# Patient Record
Sex: Male | Born: 1965
Health system: Southern US, Community
[De-identification: ages and names within clinical notes are randomized; demographics above are authoritative.]

## PROBLEM LIST (undated history)

## (undated) DIAGNOSIS — M67919 Unspecified disorder of synovium and tendon, unspecified shoulder: Secondary | ICD-10-CM

## (undated) DIAGNOSIS — I10 Essential (primary) hypertension: Secondary | ICD-10-CM

## (undated) DIAGNOSIS — N200 Calculus of kidney: Secondary | ICD-10-CM

## (undated) HISTORY — DX: Essential (primary) hypertension: I10

---

## 2003-02-08 HISTORY — PX: BACK SURGERY: SHX140

## 2003-08-20 ENCOUNTER — Observation Stay (HOSPITAL_COMMUNITY): Admission: RE | Admit: 2003-08-20 | Discharge: 2003-08-21 | Payer: Self-pay | Admitting: Specialist

## 2004-08-02 ENCOUNTER — Emergency Department (HOSPITAL_COMMUNITY): Admission: EM | Admit: 2004-08-02 | Discharge: 2004-08-03 | Payer: Self-pay | Admitting: Emergency Medicine

## 2004-11-19 ENCOUNTER — Emergency Department (HOSPITAL_COMMUNITY): Admission: EM | Admit: 2004-11-19 | Discharge: 2004-11-19 | Payer: Self-pay | Admitting: Emergency Medicine

## 2005-02-07 HISTORY — PX: SPINE SURGERY: SHX786

## 2006-04-03 ENCOUNTER — Emergency Department (HOSPITAL_COMMUNITY): Admission: EM | Admit: 2006-04-03 | Discharge: 2006-04-03 | Payer: Self-pay | Admitting: Emergency Medicine

## 2010-10-24 ENCOUNTER — Emergency Department (HOSPITAL_COMMUNITY)
Admission: EM | Admit: 2010-10-24 | Discharge: 2010-10-25 | Disposition: A | Discharge status: Home / Self Care | Payer: BC Managed Care – PPO | Attending: Emergency Medicine | Admitting: Emergency Medicine

## 2010-10-24 DIAGNOSIS — X58XXXA Exposure to other specified factors, initial encounter: Secondary | ICD-10-CM | POA: Insufficient documentation

## 2010-10-24 DIAGNOSIS — I10 Essential (primary) hypertension: Secondary | ICD-10-CM | POA: Insufficient documentation

## 2010-10-24 DIAGNOSIS — M549 Dorsalgia, unspecified: Secondary | ICD-10-CM | POA: Insufficient documentation

## 2010-10-24 DIAGNOSIS — T148XXA Other injury of unspecified body region, initial encounter: Secondary | ICD-10-CM | POA: Insufficient documentation

## 2010-10-25 LAB — URINALYSIS, ROUTINE W REFLEX MICROSCOPIC
Bilirubin Urine: NEGATIVE
Ketones, ur: NEGATIVE mg/dL
Leukocytes, UA: NEGATIVE
Nitrite: NEGATIVE
Urobilinogen, UA: 0.2 mg/dL (ref 0.0–1.0)

## 2010-10-25 LAB — POCT I-STAT, CHEM 8
BUN: 18 mg/dL (ref 6–23)
Chloride: 105 mEq/L (ref 96–112)
Creatinine, Ser: 1.2 mg/dL (ref 0.50–1.35)
Glucose, Bld: 93 mg/dL (ref 70–99)
Sodium: 141 mEq/L (ref 135–145)
TCO2: 25 mmol/L (ref 0–100)

## 2012-02-08 HISTORY — PX: SHOULDER SURGERY: SHX246

## 2012-08-20 ENCOUNTER — Ambulatory Visit (HOSPITAL_COMMUNITY)
Admission: RE | Admit: 2012-08-20 | Discharge: 2012-08-20 | Disposition: A | Discharge status: Home / Self Care | Payer: 59 | Source: Ambulatory Visit | Attending: Family Medicine | Admitting: Family Medicine

## 2012-08-20 ENCOUNTER — Ambulatory Visit (INDEPENDENT_AMBULATORY_CARE_PROVIDER_SITE_OTHER): Payer: 59 | Admitting: Family Medicine

## 2012-08-20 VITALS — BP 134/90 | HR 70 | Temp 98.1°F | Resp 16 | Ht 75.0 in | Wt 271.0 lb

## 2012-08-20 DIAGNOSIS — M51379 Other intervertebral disc degeneration, lumbosacral region without mention of lumbar back pain or lower extremity pain: Secondary | ICD-10-CM | POA: Insufficient documentation

## 2012-08-20 DIAGNOSIS — I708 Atherosclerosis of other arteries: Secondary | ICD-10-CM | POA: Insufficient documentation

## 2012-08-20 DIAGNOSIS — N289 Disorder of kidney and ureter, unspecified: Secondary | ICD-10-CM | POA: Insufficient documentation

## 2012-08-20 DIAGNOSIS — I700 Atherosclerosis of aorta: Secondary | ICD-10-CM | POA: Insufficient documentation

## 2012-08-20 DIAGNOSIS — M5137 Other intervertebral disc degeneration, lumbosacral region: Secondary | ICD-10-CM | POA: Insufficient documentation

## 2012-08-20 DIAGNOSIS — N200 Calculus of kidney: Secondary | ICD-10-CM | POA: Insufficient documentation

## 2012-08-20 DIAGNOSIS — M549 Dorsalgia, unspecified: Secondary | ICD-10-CM

## 2012-08-20 DIAGNOSIS — R3129 Other microscopic hematuria: Secondary | ICD-10-CM

## 2012-08-20 DIAGNOSIS — R911 Solitary pulmonary nodule: Secondary | ICD-10-CM | POA: Insufficient documentation

## 2012-08-20 LAB — POCT UA - MICROSCOPIC ONLY
Bacteria, U Microscopic: NEGATIVE
Casts, Ur, LPF, POC: NEGATIVE
Crystals, Ur, HPF, POC: NEGATIVE
Mucus, UA: NEGATIVE

## 2012-08-20 LAB — POCT CBC
HCT, POC: 45 % (ref 43.5–53.7)
Lymph, poc: 2 (ref 0.6–3.4)
MCHC: 33.1 g/dL (ref 31.8–35.4)
MPV: 8.7 fL (ref 0–99.8)
POC LYMPH PERCENT: 18.8 %L (ref 10–50)
POC MID %: 7.8 %M (ref 0–12)
Platelet Count, POC: 343 10*3/uL (ref 142–424)
RBC: 4.97 M/uL (ref 4.69–6.13)
WBC: 10.9 10*3/uL — AB (ref 4.6–10.2)

## 2012-08-20 LAB — POCT URINALYSIS DIPSTICK
Bilirubin, UA: NEGATIVE
Ketones, UA: NEGATIVE
Leukocytes, UA: NEGATIVE
Spec Grav, UA: >=1.03
Urobilinogen, UA: 0.2

## 2012-08-20 MED ORDER — HYDROCODONE-ACETAMINOPHEN 10-325 MG PO TABS: 1.0000 | ORAL_TABLET | Freq: Three times a day (TID) | ORAL | Status: DC | PRN

## 2012-08-20 MED ORDER — TAMSULOSIN HCL 0.4 MG PO CAPS: 0.4000 mg | ORAL_CAPSULE | Freq: Every day | ORAL | Status: DC

## 2012-08-20 NOTE — Progress Notes (Signed)
[facility] [address], [place] [address] [phone]  Date:  08/20/2012   Name:  Parker Saunders   DOB:  1965/07/28   MRN:  [id number]  PCP:  No primary provider on file.    Chief Complaint: Back Pain   History of Present Illness:  Parker Saunders is a 47 y.o. very pleasant male patient who presents with the following:  He has noted a pain in his right lower back for about 2 weeks.  He has had a disectomy in 2005 at L3/4.  This seems different than the pain he had then.    No known injury, no unusual activities.   Insidious onset.  The pain will come and go.   No hematuria. No dysuria.   No radiation to his legs.  No difficulty with bowel or bladder control, no weakness or numbness of his legs.    He has tried some ibuprofen so far, but it did not help.    He is generally healthy.    He had a possible kidney stone (never had a CT) several years ago. Never had any procedure to treat a stone  He has shoulder surgery scheduled for 08/31/12 for a WC injury  He is otherwise generally healthy    There are no active problems to display for this patient.   History reviewed. No pertinent past medical history.  Past Surgical History  Procedure Laterality Date  . Spine surgery      History  Substance Use Topics  . Smoking status: Never Smoker   . Smokeless tobacco: Not on file  . Alcohol Use: No    History reviewed. No pertinent family history.  No Known Allergies  Medication list has been reviewed and updated.  No current outpatient prescriptions on file prior to visit.   No current facility-administered medications on file prior to visit.    Review of Systems:  As per HPI- otherwise negative.   Physical Examination: Filed Vitals:   08/20/12 1453  BP: 134/90  Pulse: 100  Temp: 98.1 F (36.7 C)  Resp: 16   Filed Vitals:   08/20/12 1453  Height: 6\' 3"  (1.905 m)  Weight: 271 lb (122.925 kg)   Body mass index is 33.87  kg/(m^2). Ideal Body Weight: Weight in (lb) to have BMI = 25: 199.6  GEN: WDWN, NAD, Non-toxic, A & O x 3, health appearing  HEENT: Atraumatic, Normocephalic. Neck supple. No masses, No LAD. Ears and Nose: No external deformity. CV: RRR, No M/G/R. No JVD. No thrill. No extra heart sounds. PULM: CTA B, no wheezes, crackles, rhonchi. No retractions. No resp. distress. No accessory muscle use. ABD: S, NT, ND. No rebound. No HSM. EXTR: No c/c/e NEURO Normal gait.  PSYCH: Normally interactive. Conversant. Not depressed or anxious appearing.  Calm demeanor.  Back: he has mild tenderness and tightness of the muscles in the right lumbar area.  Normal flexion, extension and rotation of the spine Negative SLR bilateally, normal sensation and DTR bilateral legs, normal strength bilateral legs   Results for orders placed in visit on 08/20/12  POCT UA - MICROSCOPIC ONLY      Result Value Range   WBC, Ur, HPF, POC 0-2     RBC, urine, microscopic 6-15     Bacteria, U Microscopic neg     Mucus, UA neg     Epithelial cells, urine per micros 0-3     Crystals, Ur, HPF, POC neg  Casts, Ur, LPF, POC neg     Yeast, UA neg    POCT URINALYSIS DIPSTICK      Result Value Range   Color, UA yellow     Clarity, UA cloudy     Glucose, UA neg     Bilirubin, UA neg     Ketones, UA neg     Spec Grav, UA >=1.030     Blood, UA mod     pH, UA 6.0     Protein, UA 30     Urobilinogen, UA 0.2     Nitrite, UA neg     Leukocytes, UA Negative    POCT CBC      Result Value Range   WBC 10.9 (*) 4.6 - 10.2 K/uL   Lymph, poc 2.0  0.6 - 3.4   POC LYMPH PERCENT 18.8  10 - 50 %L   MID (cbc) 0.9  0 - 0.9   POC MID % 7.8  0 - 12 %M   POC Granulocyte 8.0 (*) 2 - 6.9   Granulocyte percent 73.4  37 - 80 %G   RBC 4.97  4.69 - 6.13 M/uL   Hemoglobin 14.9  14.1 - 18.1 g/dL   HCT, POC 16.1  09.6 - 53.7 %   MCV 90.5  80 - 97 fL   MCH, POC 30.0  27 - 31.2 pg   MCHC 33.1  31.8 - 35.4 g/dL   RDW, POC 04.5     Platelet  Count, POC 343  142 - 424 K/uL   MPV 8.7  0 - 99.8 fL     Assessment and Plan: Back pain - Plan: POCT UA - Microscopic Only, POCT urinalysis dipstick, CT Abdomen Pelvis Wo Contrast, HYDROcodone-acetaminophen (NORCO) 10-325 MG per tablet  Microhematuria - Plan: POCT CBC, Comprehensive metabolic panel, Urine culture, tamsulosin (FLOMAX) 0.4 MG CAPS  Probably nephrolithiasis.  Would like to confirm dx as he has never been proven to have stones in the past. Pt would like to proceed with CT scan. Sent for non- contrasted CT now.   Signed Abbe Amsterdam, MD  Received CT results-  CT ABDOMEN AND PELVIS WITHOUT CONTRAST  Technique: Multidetector CT imaging of the abdomen and pelvis was performed following the standard protocol without intravenous contrast.  Comparison: 04/03/2006.  Findings:  BODY WALL: Unremarkable.  LOWER CHEST:  Mediastinum: Unremarkable.  Lungs/pleura: 4 mm right lower lobe pulmonary nodule, contacting the dome of the diaphragm. Minimal dependent atelectasis right lower lobe. This area not imaged before.  ABDOMEN/PELVIS:  Liver: No focal abnormality.  Biliary: No evidence of biliary obstruction or stone.  Pancreas: Unremarkable.  Spleen: Unremarkable.  Adrenals: Unremarkable.  Kidneys and ureters: 10 mm stone in the right renal pelvis, new from 2008. No overt hydronephrosis. There is urothelial thickening around the stone. 3 cm water density exophytic mass from the interpolar right kidney is new or larger from prior, strictly indeterminate without contrast, but likely a cyst.  Bladder: Unremarkable.  Bowel: No obstruction. Normal appendix.  Retroperitoneum: No mass or adenopathy.  Peritoneum: No free fluid or gas.  Reproductive: Unremarkable.  Vascular: Mild aortic and iliac atherosclerosis.  OSSEOUS: No acute abnormalities. Age advanced degenerative disc and facet disease with foraminal narrowing most notable at  L4-5.  IMPRESSION:  1. Ten mm stone in the right renal pelvis, new from 2008. No hydronephrosis currently, although the study may be intermittently obstructive. Urothelial thickening surrounds the stone.  2. Four mm right lower lobe pulmonary nodule. If the  patient is at high risk for bronchogenic carcinoma, follow-up chest CT at 1 year is recommended. If the patient is at low risk, no follow-up is needed. This recommendation follows the consensus statement: Guidelines for Management of Small Pulmonary Nodules Detected on CT Scans: A Statement from the Fleischner Society as published in Radiology 2005; 237:395-400.  Called and discussed with his wife.  He does have a right renal stone which probably will not be able to pass on its own.  He will use flomax and pain medication, and will refer to urology asap.  If any severe pain tonight please go to ED.  Otherwise I will work on urology referral in the AM.   Checked on his at 10pm- he is doing ok, pain medication has helped some.  Let him know that he can also try a 1/2 pain tablet as 5mg  of hydrocodone may be adequate.  However, he weighs 271lbs so 10mg  may be necessary.

## 2012-08-20 NOTE — Patient Instructions (Addendum)
Your [facility] insurance has authorized the CT Scan to be done today . It will be at [facility]. You can go now.   I will call you once the scan is complete. Assuming that you have a stone we will have you use the flomax to help pass the stone, and the pain medication as needed.  If you do not have a stone you can just use the pain medication, and we can use a muscle relaxer if needed   Driving directions to [facility] [id number]  [phone]  - more info    [address]  [place], [place] [address]     1. Head north on [address] toward [address]      344 [address]    2. Turn right onto [address]      0.3 mi    3. Slight left to stay on [address]      1.7 mi    4. Turn left onto [address]  Destination will be on the right     0.6 mi     [facility]  [address] [place]

## 2012-08-21 ENCOUNTER — Encounter (HOSPITAL_COMMUNITY): Payer: Self-pay | Admitting: Pharmacy Technician

## 2012-08-21 ENCOUNTER — Telehealth: Payer: Self-pay

## 2012-08-21 ENCOUNTER — Other Ambulatory Visit: Payer: Self-pay | Admitting: Urology

## 2012-08-21 LAB — COMPREHENSIVE METABOLIC PANEL
ALT: 25 U/L (ref 0–53)
Albumin: 4.5 g/dL (ref 3.5–5.2)
BUN: 16 mg/dL (ref 6–23)
CO2: 25 mEq/L (ref 19–32)
Calcium: 9.3 mg/dL (ref 8.4–10.5)
Glucose, Bld: 84 mg/dL (ref 70–99)
Total Bilirubin: 0.4 mg/dL (ref 0.3–1.2)
Total Protein: 7.2 g/dL (ref 6.0–8.3)

## 2012-08-21 NOTE — Telephone Encounter (Signed)
Dr.Copland is calling to request that pts OV and CT scan from yesterday be faxed to [facility].

## 2012-08-22 ENCOUNTER — Telehealth: Payer: Self-pay | Admitting: Family Medicine

## 2012-08-22 ENCOUNTER — Encounter: Payer: Self-pay | Admitting: Family Medicine

## 2012-08-22 DIAGNOSIS — R911 Solitary pulmonary nodule: Secondary | ICD-10-CM

## 2012-08-22 LAB — URINE CULTURE
Colony Count: NO GROWTH
Organism ID, Bacteria: NO GROWTH

## 2012-08-22 NOTE — Telephone Encounter (Signed)
Called to check on him.  He is having some sort of surgery (? Lithotripsy) on Monday per urology to take care of his stone.  Discussed lung nodule noted on CT scan.  He has never been a smoker, but has worked around a lot of dust and possibly chemicals in the past.  He would like to have a repeat CT next year. I will go ahead and order this for him. Letter to pt as well

## 2012-08-23 ENCOUNTER — Encounter (HOSPITAL_COMMUNITY): Payer: Self-pay | Admitting: *Deleted

## 2012-08-23 NOTE — Progress Notes (Signed)
Instructed to read every page in blue folder complete Hx. Form. Do not take any medication that is on restricted med list. Laxative as directed by doctor. Must have driver for home .

## 2012-08-27 ENCOUNTER — Ambulatory Visit (HOSPITAL_COMMUNITY)
Admission: RE | Admit: 2012-08-27 | Discharge: 2012-08-27 | Disposition: A | Discharge status: Home / Self Care | Payer: 59 | Source: Ambulatory Visit | Attending: Urology | Admitting: Urology

## 2012-08-27 ENCOUNTER — Encounter (HOSPITAL_COMMUNITY)
Admission: RE | Disposition: A | Discharge status: Home / Self Care | Payer: Self-pay | Source: Ambulatory Visit | Attending: Urology

## 2012-08-27 ENCOUNTER — Ambulatory Visit (HOSPITAL_COMMUNITY): Payer: 59

## 2012-08-27 ENCOUNTER — Encounter (HOSPITAL_COMMUNITY): Payer: Self-pay | Admitting: *Deleted

## 2012-08-27 DIAGNOSIS — R3129 Other microscopic hematuria: Secondary | ICD-10-CM | POA: Insufficient documentation

## 2012-08-27 DIAGNOSIS — N200 Calculus of kidney: Secondary | ICD-10-CM | POA: Insufficient documentation

## 2012-08-27 HISTORY — DX: Calculus of kidney: N20.0

## 2012-08-27 HISTORY — DX: Unspecified disorder of synovium and tendon, unspecified shoulder: M67.919

## 2012-08-27 SURGERY — LITHOTRIPSY, ESWL
Anesthesia: LOCAL | Laterality: Right

## 2012-08-27 MED ORDER — DEXTROSE-NACL 5-0.45 % IV SOLN
INTRAVENOUS | Status: DC
  Administered 2012-08-27: 16:00:00 via INTRAVENOUS

## 2012-08-27 MED ORDER — DIAZEPAM 5 MG PO TABS
10.0000 mg | ORAL_TABLET | ORAL | Status: AC
  Administered 2012-08-27: 10 mg via ORAL
  Filled 2012-08-27: qty 2

## 2012-08-27 MED ORDER — DIPHENHYDRAMINE HCL 25 MG PO CAPS
25.0000 mg | ORAL_CAPSULE | ORAL | Status: AC
  Administered 2012-08-27: 25 mg via ORAL
  Filled 2012-08-27: qty 1

## 2012-08-27 MED ORDER — CIPROFLOXACIN HCL 500 MG PO TABS
500.0000 mg | ORAL_TABLET | ORAL | Status: AC
  Administered 2012-08-27: 500 mg via ORAL
  Filled 2012-08-27: qty 1

## 2012-08-27 NOTE — H&P (Signed)
ief Complaint  cc:  Abbe Amsterdam, MD   Reason For Visit  Kidney stone   Active Problems Problems  1. Flank Pain Right 2. Microscopic Hematuria 599.72 3. Nephrolithiasis Of The Right Kidney 592.0  History of Present Illness        47 yo married male referred by Dr. Patsy Lager for further evaluation of an 10mm Rt renal pelvis stone.  He was seen yesterday at [facility] because of Rt lower back pain, without nausea or voniting. He had mnicrohematuria. 1st stone. No family history. Diet Mt Dew x 2/day. Tea x 2/day.   CT showed:  1.  10mm Rt renal pelvis stone  2.  3cm water density mass interpolar Rt kidney, but indeterminate.  Most likely a renal cyst.  3.  4mm Rt lower lobe pulmonary nodule   Past Medical History Problems  1. History of  No Medical Problems  Surgical History Problems  1. History of  Back Surgery  Current Meds 1. Flomax CAPS; Therapy: (Recorded:15Jul2014) to 2. Hydrocodone-Acetaminophen TABS; Therapy: (Recorded:15Jul2014) to 3. Ibuprofen 800 MG Oral Tablet; Therapy: (Recorded:15Jul2014) to  Allergies Medication  1. No Known Drug Allergies  Family History Problems  1. Family history of  Family Health Status Number Of Children 2 daughters 2. Family history of  Family Health Status Of Mother - Alive 3. Family history of  Father Deceased At Age 62  Social History Problems    Caffeine Use   Marital History - Currently Married   Never A Smoker Denied    History of  Alcohol Use  Review of Systems Genitourinary, constitutional, skin, eye, otolaryngeal, hematologic/lymphatic, cardiovascular, pulmonary, endocrine, musculoskeletal, gastrointestinal, neurological and psychiatric system(s) were reviewed and pertinent findings if present are noted.  Genitourinary: urinary frequency, nocturia, urinary stream starts and stops, incomplete emptying of bladder and hematuria.  Musculoskeletal: back pain.    Vitals Vital Signs [Data Includes: Last 1 Day]   15Jul2014 10:36AM  BMI Calculated: 33.67 BSA Calculated: 2.48 Height: 6 ft 3 in Weight: 268 lb  Blood Pressure: 142 / 91 Temperature: 97.5 F Heart Rate: 63  Physical Exam Constitutional: Well nourished and well developed . No acute distress.  ENT:. The ears and nose are normal in appearance.  Neck: The appearance of the neck is normal and no neck mass is present.  Pulmonary: No respiratory distress and normal respiratory rhythm and effort.  Cardiovascular: Heart rate and rhythm are normal . No peripheral edema.  Abdomen: The abdomen is soft and nontender. No masses are palpated. mild right CVA tenderness no CVA tenderness. No hernias are palpable. No hepatosplenomegaly noted.  Genitourinary: Examination of the penis demonstrates no discharge, no masses, no lesions and a normal meatus. The scrotum is without lesions. The right epididymis is palpably normal and non-tender. The left epididymis is palpably normal and non-tender. The right testis is non-tender and without masses. The left testis is non-tender and without masses.  Lymphatics: The femoral and inguinal nodes are not enlarged or tender.  Skin: Normal skin turgor, no visible rash and no visible skin lesions.  Neuro/Psych:. Mood and affect are appropriate.    Results/Data  21 Aug 2012 10:11 AM   UA With REFLEX       COLOR YELLOW       APPEARANCE CLEAR       SPECIFIC GRAVITY 1.025       pH 6.0       GLUCOSE NEG       BILIRUBIN NEG  KETONE NEG       BLOOD MOD       PROTEIN NEG       UROBILINOGEN 0.2       NITRITE NEG       LEUKOCYTE ESTERASE NEG       SQUAMOUS EPITHELIAL/HPF NONE SEEN       WBC NONE SEEN       CRYSTALS NONE SEEN       CASTS NONE SEEN       RBC 7-10       BACTERIA RARE    IPSS: The IPSS today is 10  QOL score is 5    Procedure  KUB: poorly calcification: R renal pelvis.     Assessment Assessed  1. Nephrolithiasis Of The Right Kidney 592.0 2. Flank Pain Right 3. Microscopic Hematuria  599.72      CT and KUB reviewed from ER visit. He has 10mm R renal pelvic stone, but it is difficult to see on KUB. He is 6'3", 271 lbs. He will need KUB to see if we can see stone, then can have lithotripsy. Mild microhematuria. No flank pain at present.   Plan   KUB, and anticipate lithotiipsy.  UA With REFLEX  Status: Resulted - Requires Verification  Done: 01Jan0001 12:00AM Ordered Today; For: Health Maintenance (V70.0); Ordered By: Jethro Bolus  Due: 17Jul2014 Marked Important; Last Updated By: Thomasenia Sales KUB  Status: Resulted - Requires Verification  Done: 01Jan0001 12:00AM Ordered; For: Nephrolithiasis Of The Right Kidney (592.0); Ordered By: Jethro Bolus  Due: 17Jul2014 Marked Important; Last Updated By: Kingsley Callander    Annotations             identify R renal pelvic stone.   Signatures Electronically signed by : Jethro Bolus, M.D.; Aug 21 2012  5:31PM

## 2012-09-05 ENCOUNTER — Ambulatory Visit
Admission: RE | Admit: 2012-09-05 | Discharge: 2012-09-05 | Disposition: A | Discharge status: Home / Self Care | Payer: 59 | Source: Ambulatory Visit | Attending: Family Medicine | Admitting: Family Medicine

## 2012-09-05 DIAGNOSIS — R911 Solitary pulmonary nodule: Secondary | ICD-10-CM

## 2012-09-06 ENCOUNTER — Telehealth: Payer: Self-pay | Admitting: Family Medicine

## 2012-09-06 DIAGNOSIS — R911 Solitary pulmonary nodule: Secondary | ICD-10-CM

## 2012-09-06 NOTE — Telephone Encounter (Signed)
Called re: CT chest done yesterday to follow-up pulmonary nodule seen on CT abd/ pelvis:  IMPRESSION:  3 mm in nodule in the right lower lobe, at the dome of the right  hemidiaphragm, appears hyperdense and is almost assuredly a  granuloma but calcification of the lesion is difficult to confirm  given its tiny size. If the patient is at high risk for  bronchogenic carcinoma, follow-up chest CT at 1 year is  recommended. If the patient is at low risk, no follow-up is  needed. This recommendation follows the consensus statement:  Guidelines for Management of Small Pulmonary Nodules Detected on CT  Scans: A Statement from the Fleischner Society as published in  Radiology 2005; 237:395-400.    Small probably granuloma. Appears benign Parker Saunders has never been a smoker, but he is concerned about his history of exposure to dust and chemicals through his job.  He would like to repeat CT in one year, I will order this for him

## 2013-01-25 ENCOUNTER — Ambulatory Visit (INDEPENDENT_AMBULATORY_CARE_PROVIDER_SITE_OTHER): Payer: 59 | Admitting: Emergency Medicine

## 2013-01-25 VITALS — BP 140/100 | HR 98 | Temp 98.0°F | Resp 18 | Ht 73.5 in | Wt 277.0 lb

## 2013-01-25 DIAGNOSIS — S4991XA Unspecified injury of right shoulder and upper arm, initial encounter: Secondary | ICD-10-CM

## 2013-01-25 DIAGNOSIS — S4980XA Other specified injuries of shoulder and upper arm, unspecified arm, initial encounter: Secondary | ICD-10-CM

## 2013-01-25 DIAGNOSIS — I10 Essential (primary) hypertension: Secondary | ICD-10-CM | POA: Insufficient documentation

## 2013-01-25 LAB — POCT CBC
Granulocyte percent: 70.4 %G (ref 37–80)
HCT, POC: 44.2 % (ref 43.5–53.7)
Hemoglobin: 14.1 g/dL (ref 14.1–18.1)
Lymph, poc: 1.6 (ref 0.6–3.4)
MCHC: 31.9 g/dL (ref 31.8–35.4)
MCV: 90 fL (ref 80–97)
POC Granulocyte: 5.1 (ref 2–6.9)

## 2013-01-25 LAB — COMPREHENSIVE METABOLIC PANEL
Albumin: 4.4 g/dL (ref 3.5–5.2)
CO2: 25 mEq/L (ref 19–32)
Glucose, Bld: 88 mg/dL (ref 70–99)
Potassium: 4.4 mEq/L (ref 3.5–5.3)
Sodium: 138 mEq/L (ref 135–145)
Total Protein: 7.3 g/dL (ref 6.0–8.3)

## 2013-01-25 LAB — LIPID PANEL: Cholesterol: 190 mg/dL (ref 0–200)

## 2013-01-25 LAB — TSH: TSH: 1.19 u[IU]/mL (ref 0.350–4.500)

## 2013-01-25 MED ORDER — LOSARTAN POTASSIUM 50 MG PO TABS: 50.0000 mg | ORAL_TABLET | Freq: Every day | ORAL | Status: DC

## 2013-01-25 NOTE — Progress Notes (Signed)
   Subjective:    Patient ID: Parker Saunders, male    DOB: 1965-12-05, 47 y.o.   MRN: [id number]  HPI  Patient presents today with a clearance for therapy at [facility]. He has a workers comp injury for his right shoulder. He states that the last couple times he has been at ortho he has had real high Blood Pressure. They are concerned to do the FCE because his BP is high. He has lost some mobility in the right arm so the further testing is necessary to determine the magnitude of the injury. Right arm checked with 146/98. Left arm was 140/92. The shoulder injury started in April of 2014. He had surgery on the right shoulder on July.    Review of Systems     Objective:   Physical Exam patient is alert and cooperative he is in no distress. His neck is supple. Thyroid not enlarged his chest is clear heart regular rate no murmurs abdomen soft nontender extremities are without edema pulses are 2+ EKG normal sinus rhythm no evidence of LVH.      Assessment & Plan:  Go ahead and start patient on losartan 50 mg one a day he was given information about hypertension. I did feel it would be safe for him to do his functional capacity evaluation as long as his blood pressure is under 140/90

## 2013-01-25 NOTE — Patient Instructions (Signed)

## 2013-02-06 ENCOUNTER — Telehealth: Payer: Self-pay

## 2013-02-06 NOTE — Telephone Encounter (Signed)
Pt states that his blood pressure has been up lately even with taking his b/p medication, on Monday it was 138/100 and today it read 132/100. He would like to know what he should do. Best# [phone]

## 2013-02-07 NOTE — Telephone Encounter (Signed)
Please advise. He was seen for clearance for FCE recently

## 2013-02-08 MED ORDER — HYDROCHLOROTHIAZIDE 12.5 MG PO CAPS: 12.5000 mg | ORAL_CAPSULE | Freq: Every day | ORAL | Status: DC

## 2013-02-08 NOTE — Telephone Encounter (Signed)
Sent in and advised patient.

## 2013-02-08 NOTE — Telephone Encounter (Signed)
Please add HCTZ 12.5 mg to take one a day he can have #30 tablets he can have 5 refills he needs to see me in one to 2 weeks for blood pressure recheck.

## 2013-02-19 ENCOUNTER — Telehealth: Payer: Self-pay

## 2013-02-19 ENCOUNTER — Telehealth: Payer: Self-pay | Admitting: Radiology

## 2013-02-19 NOTE — Telephone Encounter (Signed)
PT from [facility] called to advise she is w/pt sched to do a function lift test which is quite a bit more physical than reg PT and takes about 5 hrs. Pt's BP at rest is between 132/100 and 138/101. Pt reports he has been compliant w/meds and his BP has been running at home around 132/92. I advised to not do testing today since BP above parameters set by Dr Cleta Albertsaub of 140/90. Dr Cleta Albertsaub, do you want to change pt's meds or see pt back in office? Any instr's for him or PT?

## 2013-02-19 NOTE — Telephone Encounter (Signed)
Have the patient come in and see me later this week

## 2013-02-19 NOTE — Telephone Encounter (Signed)
Called, pt agreed to RTC on Thurs.

## 2013-02-19 NOTE — Telephone Encounter (Signed)
Patient is due for follow up with his blood pressure/ to clear him for the FCE. Called him his wife indicates he is feeling better on both BP agents, and has gone for the FCE today. Clearance form faxed. WC wants [facility]. I can not send notes without patient approval, he was not seen under WC, he was seen in Epic

## 2013-02-21 ENCOUNTER — Ambulatory Visit (INDEPENDENT_AMBULATORY_CARE_PROVIDER_SITE_OTHER): Payer: 59 | Admitting: Emergency Medicine

## 2013-02-21 VITALS — BP 140/80 | HR 79 | Temp 98.0°F | Resp 16 | Ht 73.0 in | Wt 273.0 lb

## 2013-02-21 DIAGNOSIS — I10 Essential (primary) hypertension: Secondary | ICD-10-CM

## 2013-02-21 MED ORDER — LOSARTAN POTASSIUM 100 MG PO TABS: 100.0000 mg | ORAL_TABLET | Freq: Every day | ORAL | Status: DC

## 2013-02-21 NOTE — Patient Instructions (Addendum)
Please recheck in one month. We will do some blood work at that time to followup on your potassium and renal function.

## 2013-02-21 NOTE — Progress Notes (Signed)
   Subjective:  This chart was scribed for Parker SpareSteven A. Cleta Albertsaub, MD by Ashley JacobsBrittany Andrews, [facility] Scribe. The patient was seen in room and the patient's care was started at 8:20 AM.   Patient ID: Parker Saunders, male    DOB: [birth year], 48 y.o.   MRN: [id number]  HPI HPI Comments: Parker LucySamuel W Saunders is a 48 y.o. male who [facility] complaining of elevated BP and clearance [facility]. Pt had a elevated BP of 132/100 and 138/100 which was taken yesterday at 7:30 AM during his appointment with his physical therapist for a functional capacity evaluation. His evaluation was unable to be performed because his BP was elevated. [facility] his BP is 128/88. Pt states taking his medications regularly. He typically measures his BP each morning. Pt's initial injury was April, 2014 when a piece of metal struck his left shoulder and he had to have surgery July,2014. Pt has not been able to return to work since injury. 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  He is currently under [employer].  BP 140/80  Pulse 79  Temp(Src) 98 F (36.7 C) (Oral)  Resp 16  Ht 6\' 1"  (1.854 m)  Wt 273 lb (123.832 kg)  BMI 36.03 kg/m2  SpO2 98%  Review of Systems  Cardiovascular:       HTN       Objective:   Physical Exam repeat blood pressures are as recorded.    DIAGNOSTIC STUDIES: Oxygen Saturation is 98% on room air, normal by my interpretation.    COORDINATION OF CARE:   Discussed course of care with pt which includes increasing his Losartan and Cozaar rx. Pt understands and agrees.    Assessment & Plan:  Current regimen will be losartan 100 mg one a day and HCTZ 12.5 one a day. We'll recheck in one month and check Bmet at that time.

## 2013-07-21 ENCOUNTER — Ambulatory Visit (INDEPENDENT_AMBULATORY_CARE_PROVIDER_SITE_OTHER): Payer: BC Managed Care – PPO | Admitting: Internal Medicine

## 2013-07-21 VITALS — BP 136/78 | HR 94 | Temp 97.6°F | Resp 15 | Ht 73.0 in | Wt 287.8 lb

## 2013-07-21 DIAGNOSIS — R0789 Other chest pain: Secondary | ICD-10-CM

## 2013-07-21 DIAGNOSIS — R071 Chest pain on breathing: Secondary | ICD-10-CM

## 2013-07-21 MED ORDER — MELOXICAM 15 MG PO TABS: 15.0000 mg | ORAL_TABLET | Freq: Every day | ORAL | Status: DC

## 2013-07-21 NOTE — Progress Notes (Signed)
   Subjective:    Patient ID: Genoveva IllSamuel Macken, male    DOB: [birth year], 48 y.o.   MRN: [id number]  HPI Complaining of the abrupt onset of chest pain yesterday afternoon which has persisted ever since This started just after water activity with lifting and throwing daughter No associated shortness of breath, palpitations, diaphoresis, nausea.  The chest pain is worse with lifting or rotation No weakness or fatigue No fever or cough  On medication for hypertension with no history of heart problems Review of Systems Noncontributory    Objective:   Physical Exam BP 136/78  Pulse 94  Temp(Src) 97.6 F (36.4 C) (Oral)  Resp 15  Ht 6\' 1"  (1.854 m)  Wt 287 lb 12.8 oz (130.545 kg)  BMI 37.98 kg/m2  SpO2 97% HEENT clear Neck full range of motion Heart regular without murmur He is tender on the left third and fourth costosternal junctions and palpation reproduces his pain No defect or redness Pain in that spot maximize shoulder elevation or Rotation of chest wall       Assessment & Plan:  Chest wall pain  Meds ordered this encounter  Medications  . meloxicam (MOBIC) 15 MG tablet    Sig: Take 1 tablet (15 mg total) by mouth daily.    Dispense:  30 tablet    Refill:  0   Ice 20' prn Followup if not well in 7-14 days

## 2013-08-01 ENCOUNTER — Other Ambulatory Visit: Payer: Self-pay | Admitting: Emergency Medicine

## 2013-08-04 NOTE — Telephone Encounter (Signed)
Needs Labs 

## 2013-12-23 ENCOUNTER — Ambulatory Visit (INDEPENDENT_AMBULATORY_CARE_PROVIDER_SITE_OTHER): Payer: BC Managed Care – PPO | Admitting: Emergency Medicine

## 2013-12-23 VITALS — BP 136/90 | HR 98 | Temp 97.6°F | Resp 16 | Ht 73.0 in | Wt 291.6 lb

## 2013-12-23 DIAGNOSIS — M5442 Lumbago with sciatica, left side: Secondary | ICD-10-CM

## 2013-12-23 MED ORDER — HYDROCODONE-ACETAMINOPHEN 5-325 MG PO TABS: 1.0000 | ORAL_TABLET | Freq: Four times a day (QID) | ORAL | Status: DC | PRN

## 2013-12-23 MED ORDER — PREDNISONE 10 MG PO TABS: ORAL_TABLET | ORAL | Status: DC

## 2013-12-23 NOTE — Patient Instructions (Signed)
Back Pain, Adult Low back pain is very common. About 1 in 5 people have back pain.The cause of low back pain is rarely dangerous. The pain often gets better over time.About half of people with a sudden onset of back pain feel better in just 2 weeks. About 8 in 10 people feel better by 6 weeks.  CAUSES Some common causes of back pain include:  Strain of the muscles or ligaments supporting the spine.  Wear and tear (degeneration) of the spinal discs.  Arthritis.  Direct injury to the back. DIAGNOSIS Most of the time, the direct cause of low back pain is not known.However, back pain can be treated effectively even when the exact cause of the pain is unknown.Answering your caregiver's questions about your overall health and symptoms is one of the most accurate ways to make sure the cause of your pain is not dangerous. If your caregiver needs more information, he or she may order lab work or imaging tests (X-rays or MRIs).However, even if imaging tests show changes in your back, this usually does not require surgery. HOME CARE INSTRUCTIONS For many people, back pain returns.Since low back pain is rarely dangerous, it is often a condition that people can learn to manageon their own.   Remain active. It is stressful on the back to sit or stand in one place. Do not sit, drive, or stand in one place for more than 30 minutes at a time. Take short walks on level surfaces as soon as pain allows.Try to increase the length of time you walk each day.  Do not stay in bed.Resting more than 1 or 2 days can delay your recovery.  Do not avoid exercise or work.Your body is made to move.It is not dangerous to be active, even though your back may hurt.Your back will likely heal faster if you return to being active before your pain is gone.  Pay attention to your body when you bend and lift. Many people have less discomfortwhen lifting if they bend their knees, keep the load close to their bodies,and  avoid twisting. Often, the most comfortable positions are those that put less stress on your recovering back.  Find a comfortable position to sleep. Use a firm mattress and lie on your side with your knees slightly bent. If you lie on your back, put a pillow under your knees.  Only take over-the-counter or prescription medicines as directed by your caregiver. Over-the-counter medicines to reduce pain and inflammation are often the most helpful.Your caregiver may prescribe muscle relaxant drugs.These medicines help dull your pain so you can more quickly return to your normal activities and healthy exercise.  Put ice on the injured area.  Put ice in a plastic bag.  Place a towel between your skin and the bag.  Leave the ice on for 15-20 minutes, 03-04 times a day for the first 2 to 3 days. After that, ice and heat may be alternated to reduce pain and spasms.  Ask your caregiver about trying back exercises and gentle massage. This may be of some benefit.  Avoid feeling anxious or stressed.Stress increases muscle tension and can worsen back pain.It is important to recognize when you are anxious or stressed and learn ways to manage it.Exercise is a great option. SEEK MEDICAL CARE IF:  You have pain that is not relieved with rest or medicine.  You have pain that does not improve in 1 week.  You have new symptoms.  You are generally not feeling well. SEEK   IMMEDIATE MEDICAL CARE IF:   You have pain that radiates from your back into your legs.  You develop new bowel or bladder control problems.  You have unusual weakness or numbness in your arms or legs.  You develop nausea or vomiting.  You develop abdominal pain.  You feel faint. Document Released: 01/24/2005 Document Revised: 07/26/2011 Document Reviewed: 05/28/2013 [employer] Patient Information 2015 [employer]. This information is not intended to replace advice given to you by your health care provider. Make sure you  discuss any questions you have with your health care provider.  

## 2013-12-23 NOTE — Progress Notes (Signed)
Subjective:  This chart was scribed for Viviann SpareSteven A. Cleta Albertsaub, MD by Richarda Overlieichard Holland, Medical scribe. This patient was seen in ROOM 14 and the patient's care was started 8:58 AM.   Patient ID: Parker Saunders, male    DOB: 1965/07/05, 48 y.o.   MRN: [id number] Chief Complaint  Patient presents with  . Back Pain    lower back pain that is starting to spread down the left leg. On and off for years. Described as a sharp pain.     HPI HPI Comments: Parker Saunders is a 48 y.o. male who [facility] complaining of intermittent lower back pain for the past couple months that worsened recently. He states his pain radiates down his left leg and into his left foot and describes the pain as sharp. Pt [facility] about 3 to 4 years ago. Pt reports he had a back surgery 10 years ago for his degenerative disc disease. Pt states he has received epidural injection treatment a long time ago which has failed to relieve his pain. Pt states that Prednisone has provided some relief to his symptoms for past episodes.   Past Medical History  Diagnosis Date  . Renal stone   . Rotator cuff disorder    No Known Allergies   Current Outpatient Prescriptions on File Prior to Visit  Medication Sig Dispense Refill  . ibuprofen (ADVIL,MOTRIN) 200 MG tablet Take 200 mg by mouth every 6 (six) hours as needed.    [name]. losartan (COZAAR) 100 MG tablet Take 1 tablet (100 mg total) by mouth daily. 90 tablet 3  . hydrochlorothiazide (MICROZIDE) 12.5 MG capsule Take 1 capsule (12.5 mg total) by mouth daily. NEED LABS!! 30 capsule 0  . meloxicam (MOBIC) 15 MG tablet Take 1 tablet (15 mg total) by mouth daily. 30 tablet 0   No current facility-administered medications on file prior to visit.   History reviewed. No pertinent family history.  Filed Vitals:   12/23/13 0840  BP: 136/90  Pulse: 98  Temp: 97.6 F (36.4 C)  Resp: 16   Past Surgical History  Procedure Laterality  Date  . Spine surgery    . Back surgery  2005  . Shoulder surgery Right 2014   Filed Vitals:   12/23/13 0840  BP: 136/90  Pulse: 98  Temp: 97.6 F (36.4 C)  Resp: 16    Review of Systems  Musculoskeletal: Positive for myalgias and back pain.      Objective:  Physical Exam  Constitutional: He is oriented to person, place, and time. He appears well-developed and well-nourished.  HENT:  Head: Normocephalic and atraumatic.  Neck: Normal range of motion. Neck supple. No tracheal deviation present.  Cardiovascular: Normal rate.   Pulmonary/Chest: Effort normal. No respiratory distress.  Abdominal: He exhibits no distension.  Musculoskeletal: He exhibits tenderness.  Tender over lower lumbar spine. Well healed scar over lower lumbar spine. Reflex and motor strength in lower extremities are normal. Numbness in the lateral left calf.   Neurological: He is alert and oriented to person, place, and time.  Skin: Skin is warm and dry.  Psychiatric: He has a normal mood and affect. His behavior is normal.  Nursing note and vitals reviewed.       Assessment & Plan:  Patient with a long history of back pain. Has had one procedure done but did not respond well to epidural injections. We'll treat with a tapered dose prednisone hydrocodone for pain. We'll  recheck in 1 week. If not showing significant improvement will consider referral back to Dr. Jillyn HiddenBean or further imaging studies. On his return to clinic would advise patient to be fasting so routine blood work could be done to follow-up on his hypertension.

## 2014-02-02 ENCOUNTER — Other Ambulatory Visit: Payer: Self-pay | Admitting: Emergency Medicine

## 2014-03-02 ENCOUNTER — Ambulatory Visit (HOSPITAL_BASED_OUTPATIENT_CLINIC_OR_DEPARTMENT_OTHER)
Admission: RE | Admit: 2014-03-02 | Discharge: 2014-03-02 | Disposition: A | Discharge status: Home / Self Care | Payer: BLUE CROSS/BLUE SHIELD | Source: Ambulatory Visit | Attending: Emergency Medicine | Admitting: Emergency Medicine

## 2014-03-02 ENCOUNTER — Ambulatory Visit (INDEPENDENT_AMBULATORY_CARE_PROVIDER_SITE_OTHER): Payer: BLUE CROSS/BLUE SHIELD | Admitting: Emergency Medicine

## 2014-03-02 VITALS — BP 134/90 | HR 69 | Temp 97.8°F | Resp 20 | Ht 73.25 in | Wt 292.6 lb

## 2014-03-02 DIAGNOSIS — R1030 Lower abdominal pain, unspecified: Secondary | ICD-10-CM | POA: Diagnosis present

## 2014-03-02 DIAGNOSIS — N132 Hydronephrosis with renal and ureteral calculous obstruction: Secondary | ICD-10-CM | POA: Insufficient documentation

## 2014-03-02 DIAGNOSIS — N200 Calculus of kidney: Secondary | ICD-10-CM

## 2014-03-02 LAB — POCT UA - MICROSCOPIC ONLY
Casts, Ur, LPF, POC: NEGATIVE
Crystals, Ur, HPF, POC: NEGATIVE
Mucus, UA: NEGATIVE
Yeast, UA: NEGATIVE

## 2014-03-02 LAB — POCT URINALYSIS DIPSTICK
Bilirubin, UA: NEGATIVE
GLUCOSE UA: NEGATIVE
KETONES UA: NEGATIVE
Leukocytes, UA: NEGATIVE
NITRITE UA: NEGATIVE
PH UA: 5.5
Protein, UA: 30
Spec Grav, UA: 1.02
UROBILINOGEN UA: 0.2

## 2014-03-02 MED ORDER — OXYCODONE-ACETAMINOPHEN 5-325 MG PO TABS: 1.0000 | ORAL_TABLET | Freq: Three times a day (TID) | ORAL | Status: DC | PRN

## 2014-03-02 MED ORDER — KETOROLAC TROMETHAMINE 60 MG/2ML IM SOLN
60.0000 mg | Freq: Once | INTRAMUSCULAR | Status: AC
  Administered 2014-03-02: 60 mg via INTRAMUSCULAR

## 2014-03-02 NOTE — Patient Instructions (Addendum)
[facility]. You will need to go to the Emergency Department and tell them you are checking in for an outpatient CT scan. They will contact the tech to come get you.  [facility] [address] [place] [address]    [phone] Stones Kidney stones (urolithiasis) are deposits that form inside your kidneys. The intense pain is caused by the stone moving through the urinary tract. When the stone moves, the ureter goes into spasm around the stone. The stone is usually passed in the urine.  CAUSES   A disorder that makes certain neck glands produce too much parathyroid hormone (primary hyperparathyroidism).  A buildup of uric acid crystals, similar to gout in your joints.  Narrowing (stricture) of the ureter.  A kidney obstruction present at birth (congenital obstruction).  Previous surgery on the kidney or ureters.  Numerous kidney infections. SYMPTOMS   Feeling sick to your stomach (nauseous).  Throwing up (vomiting).  Blood in the urine (hematuria).  Pain that usually spreads (radiates) to the groin.  Frequency or urgency of urination. DIAGNOSIS   Taking a history and physical exam.  Blood or urine tests.  CT scan.  Occasionally, an examination of the inside of the urinary bladder (cystoscopy) is performed. TREATMENT   Observation.  Increasing your fluid intake.  Extracorporeal shock wave lithotripsy--This is a noninvasive procedure that uses shock waves to break up kidney stones.  Surgery may be needed if you have severe pain or persistent obstruction. There are various surgical procedures. Most of the procedures are performed with the use of small instruments. Only small incisions are needed to accommodate these instruments, so recovery time is minimized. The size, location, and chemical composition are all important variables that will determine the proper choice of action for you. Talk to  your health care provider to better understand your situation so that you will minimize the risk of injury to yourself and your kidney.  HOME CARE INSTRUCTIONS   Drink enough water and fluids to keep your urine clear or pale yellow. This will help you to pass the stone or stone fragments.  Strain all urine through the provided strainer. Keep all particulate matter and stones for your health care provider to see. The stone causing the pain may be as small as a grain of salt. It is very important to use the strainer each and every time you pass your urine. The collection of your stone will allow your health care provider to analyze it and verify that a stone has actually passed. The stone analysis will often identify what you can do to reduce the incidence of recurrences.  Only take over-the-counter or prescription medicines for pain, discomfort, or fever as directed by your health care provider.  Make a follow-up appointment with your health care provider as directed.  Get follow-up X-rays if required. The absence of pain does not always mean that the stone has passed. It may have only stopped moving. If the urine remains completely obstructed, it can cause loss of kidney function or even complete destruction of the kidney. It is your responsibility to make sure X-rays and follow-ups are completed. Ultrasounds of the kidney can show blockages and the status of the kidney. Ultrasounds are not associated with any radiation and can be performed easily in a matter of minutes. SEEK MEDICAL CARE IF:  You experience pain that is progressive and unresponsive to any pain medicine you have been prescribed. SEEK  IMMEDIATE MEDICAL CARE IF:   Pain cannot be controlled with the prescribed medicine.  You have a fever or shaking chills.  The severity or intensity of pain increases over 18 hours and is not relieved by pain medicine.  You develop a new onset of abdominal pain.  You feel faint or pass  out.  You are unable to urinate. MAKE SURE YOU:   Understand these instructions.  Will watch your condition.  Will get help right away if you are not doing well or get worse. Document Released: 01/24/2005 Document Revised: 09/26/2012 Document Reviewed: 06/27/2012 [facility] Patient Information 2015 [place], [place]. This information is not intended to replace advice given to you by your health care provider. Make sure you discuss any questions you have with your health care provider.

## 2014-03-02 NOTE — Progress Notes (Signed)
[facility] [address], [place] [address] [phone]  Date:  03/02/2014   Name:  Parker IllSamuel Saunders   DOB:  [birth year]   MRN:  [id number]  PCP:  Nilda SimmerSMITH,KRISTI, MD    Chief Complaint: Abdominal Pain and Back Pain   History of Present Illness:  Parker Saunders is a 49 y.o. very pleasant male patient who presents with the following:  History of a kidney stone 1 cm 2 years ago. Has similar onset pain this am.  Radiates from right CVA into right groin. No fever or chills Nauseated at times with occasional vomiting. No hematuria  No improvement with over the counter medications or other home remedies.  Denies other complaint or health concern today.   Patient Active Problem List   Diagnosis Date Noted  . Unspecified essential hypertension 01/25/2013    Past Medical History  Diagnosis Date  . Renal stone   . Rotator cuff disorder     Past Surgical History  Procedure Laterality Date  . Spine surgery    . Back surgery  2005  . Shoulder surgery Right 2014    History  Substance Use Topics  . Smoking status: Never Smoker   . Smokeless tobacco: Not on file  . Alcohol Use: No    No family history on file.  No Known Allergies  Medication list has been reviewed and updated.  Current Outpatient Prescriptions on File Prior to Visit  Medication Sig Dispense Refill  . ibuprofen (ADVIL,MOTRIN) 200 MG tablet Take 200 mg by mouth every 6 (six) hours as needed.    [name]. losartan (COZAAR) 100 MG tablet Take 1 tablet (100 mg total) by mouth daily. PATIENT NEEDS CHECK UP FOR ADDITIONAL REFILLS 30 tablet 0   No current facility-administered medications on file prior to visit.    Review of Systems:  As per HPI, otherwise negative.    Physical Examination: Filed Vitals:   03/02/14 1448  BP: 134/90  Pulse: 69  Temp: 97.8 F (36.6 C)  Resp: 20   Filed Vitals:   03/02/14 1448  Height: 6' 1.25" (1.861 m)  Weight: 292 lb 9.6 oz (132.722 kg)   Body  mass index is 38.32 kg/(m^2). Ideal Body Weight: Weight in (lb) to have BMI = 25: 190.4  GEN: obese, moderate distress, Non-toxic, A & O x 3 HEENT: Atraumatic, Normocephalic. Neck supple. No masses, No LAD. Ears and Nose: No external deformity. CV: RRR, No M/G/R. No JVD. No thrill. No extra heart sounds. PULM: CTA B, no wheezes, crackles, rhonchi. No retractions. No resp. distress. No accessory muscle use. ABD: S, NT, ND, +BS. No rebound. No HSM. EXTR: No c/c/e NEURO Normal gait.  PSYCH: Normally interactive. Conversant. Not depressed or anxious appearing.  Calm demeanor.    Assessment and Plan: Kidney stone right CT   Signed,  Phillips OdorJeffery Meigan Pates, MD   Results for orders placed or performed in visit on 03/02/14  POCT UA - Microscopic Only  Result Value Ref Range   WBC, Ur, HPF, POC 0-3    RBC, urine, microscopic 7-20    Bacteria, U Microscopic trace    Mucus, UA neg    Epithelial cells, urine per micros 0-1    Crystals, Ur, HPF, POC neg    Casts, Ur, LPF, POC neg    Yeast, UA neg    Results for orders placed or performed in visit on 03/02/14  POCT UA - Microscopic Only  Result Value Ref Range   WBC, Ur,  HPF, POC 0-3    RBC, urine, microscopic 7-20    Bacteria, U Microscopic trace    Mucus, UA neg    Epithelial cells, urine per micros 0-1    Crystals, Ur, HPF, POC neg    Casts, Ur, LPF, POC neg    Yeast, UA neg   POCT urinalysis dipstick  Result Value Ref Range   Color, UA yellow    Clarity, UA hazy    Glucose, UA neg    Bilirubin, UA neg    Ketones, UA neg    Spec Grav, UA 1.020    Blood, UA mod    pH, UA 5.5    Protein, UA 30    Urobilinogen, UA 0.2    Nitrite, UA neg    Leukocytes, UA Negative

## 2014-03-21 ENCOUNTER — Ambulatory Visit (INDEPENDENT_AMBULATORY_CARE_PROVIDER_SITE_OTHER): Payer: BLUE CROSS/BLUE SHIELD | Admitting: Emergency Medicine

## 2014-03-21 VITALS — BP 124/76 | HR 68 | Temp 98.0°F | Resp 17 | Ht 73.5 in | Wt 288.0 lb

## 2014-03-21 DIAGNOSIS — I10 Essential (primary) hypertension: Secondary | ICD-10-CM

## 2014-03-21 LAB — POCT CBC
GRANULOCYTE PERCENT: 78.1 % (ref 37–80)
HEMATOCRIT: 42.1 % — AB (ref 43.5–53.7)
HEMOGLOBIN: 14 g/dL — AB (ref 14.1–18.1)
Lymph, poc: 1.5 (ref 0.6–3.4)
MCH, POC: 28.5 pg (ref 27–31.2)
MCHC: 33.2 g/dL (ref 31.8–35.4)
MCV: 85.9 fL (ref 80–97)
MID (cbc): 0.2 (ref 0–0.9)
MPV: 7.2 fL (ref 0–99.8)
POC Granulocyte: 5.8 (ref 2–6.9)
POC LYMPH %: 19.6 % (ref 10–50)
POC MID %: 2.3 %M (ref 0–12)
Platelet Count, POC: 311 10*3/uL (ref 142–424)
RBC: 4.9 M/uL (ref 4.69–6.13)
RDW, POC: 12.9 %
WBC: 7.4 10*3/uL (ref 4.6–10.2)

## 2014-03-21 MED ORDER — LOSARTAN POTASSIUM 100 MG PO TABS: ORAL_TABLET | ORAL | Status: DC

## 2014-03-21 NOTE — Patient Instructions (Signed)

## 2014-03-21 NOTE — Progress Notes (Addendum)
Subjective:    Patient ID: Parker Saunders, male    DOB: December 26, 1965, 49 y.o.   MRN: [id number]  This chart was scribed for Lucilla EdinSteve A Daub, MD, by Ronney LionSuzanne Le, ED Scribe. This patient was seen in room 5 and the patient's care was started at 11:04 AM.   HPI  HPI Comments: Parker Saunders is a 49 y.o. male who [facility] hypertension medication refill. Patient last saw me before his mother passed away due to pneumonia in December, shortly before Christmas. He has been on hypertension "for a while." He denies any problems with his medication; he denies chest pain, SOB, or pedal edema. Patient is not currently working. Patient has not eaten yet today. Patient has been eating a lot of salad and drinking a lot of water, with his weight down to 287. He has also been walking for exercise.   Patient Active Problem List   Diagnosis Date Noted  . Unspecified essential hypertension 01/25/2013   Past Medical History  Diagnosis Date  . Renal stone   . Rotator cuff disorder    Past Surgical History  Procedure Laterality Date  . Spine surgery    . Back surgery  2005  . Shoulder surgery Right 2014   No Known Allergies Prior to Admission medications   Medication Sig Start Date End Date Taking? Authorizing Provider  ibuprofen (ADVIL,MOTRIN) 200 MG tablet Take 200 mg by mouth every 6 (six) hours as needed.   Yes Historical Provider, MD  losartan (COZAAR) 100 MG tablet Take 1 tablet (100 mg total) by mouth daily. PATIENT NEEDS CHECK UP FOR ADDITIONAL REFILLS 02/04/14  Yes Collene GobbleSteven A Daub, MD   History   Social History  . Marital Status: Married    Spouse Name: N/A  . Number of Children: N/A  . Years of Education: N/A   Occupational History  . Not on file.   Social History Main Topics  . Smoking status: Never Smoker   . Smokeless tobacco: Not on file  . Alcohol Use: No  . Drug Use: No  . Sexual Activity: Yes   Other Topics Concern  . Not on file    Social History Narrative      Review of Systems  Constitutional: Negative for fever, chills, fatigue and unexpected weight change.  Eyes: Negative for visual disturbance.  Respiratory: Negative for cough, chest tightness and shortness of breath.   Cardiovascular: Negative for chest pain, palpitations and leg swelling.  Gastrointestinal: Negative for abdominal pain and blood in stool.  Neurological: Negative for dizziness, light-headedness and headaches.       Objective:   Physical Exam  Nursing note and vitals reviewed.  CONSTITUTIONAL: Well developed/well nourished; Alert and cooperative HEAD: Normocephalic/atraumatic EYES: EOMI/PERRL ENMT: Mucous membranes moist NECK: supple no meningeal signs SPINE/BACK:entire spine nontender CV: S1/S2 noted, no murmurs/rubs/gallops noted; Regular rate and rhythm LUNGS: Lungs are clear to auscultation bilaterally, no apparent distress ABDOMEN: soft, nontender, no rebound or guarding, bowel sounds noted throughout abdomen GU:no cva tenderness NEURO: Pt is awake/alert/appropriate, moves all extremitiesx4.  No facial droop.   EXTREMITIES: pulses normal/equal, full ROM SKIN: warm, color normal PSYCH: no abnormalities of mood noted, alert and oriented to situation  Results for orders placed or performed in visit on 03/21/14  POCT CBC  Result Value Ref Range   WBC 7.4 4.6 - 10.2 K/uL   Lymph, poc 1.5 0.6 - 3.4   POC LYMPH PERCENT 19.6 10 - 50 %L  MID (cbc) 0.2 0 - 0.9   POC MID % 2.3 0 - 12 %M   POC Granulocyte 5.8 2 - 6.9   Granulocyte percent 78.1 37 - 80 %G   RBC 4.90 4.69 - 6.13 M/uL   Hemoglobin 14.0 (A) 14.1 - 18.1 g/dL   HCT, POC 16.1 (A) 09.6 - 53.7 %   MCV 85.9 80 - 97 fL   MCH, POC 28.5 27 - 31.2 pg   MCHC 33.2 31.8 - 35.4 g/dL   RDW, POC 04.5 %   Platelet Count, POC 311 142 - 424 K/uL   MPV 7.2 0 - 99.8 fL      Assessment & Plan:  Routine labs were done. Blood pressure medications were refilled.I personally performed  the services described in this documentation, which was scribed in my presence. The recorded information has been reviewed and is accurate.

## 2014-03-22 LAB — COMPREHENSIVE METABOLIC PANEL
ALK PHOS: 79 U/L (ref 39–117)
ALT: 40 U/L (ref 0–53)
AST: 24 U/L (ref 0–37)
Albumin: 4.2 g/dL (ref 3.5–5.2)
BILIRUBIN TOTAL: 0.4 mg/dL (ref 0.2–1.2)
BUN: 15 mg/dL (ref 6–23)
CALCIUM: 9.2 mg/dL (ref 8.4–10.5)
CO2: 24 meq/L (ref 19–32)
CREATININE: 0.95 mg/dL (ref 0.50–1.35)
Chloride: 106 mEq/L (ref 96–112)
Glucose, Bld: 91 mg/dL (ref 70–99)
POTASSIUM: 4.4 meq/L (ref 3.5–5.3)
SODIUM: 138 meq/L (ref 135–145)
Total Protein: 7.3 g/dL (ref 6.0–8.3)

## 2014-03-22 LAB — LIPID PANEL
CHOL/HDL RATIO: 6.3 ratio
Cholesterol: 202 mg/dL — ABNORMAL HIGH (ref 0–200)
HDL: 32 mg/dL — AB (ref >39)
LDL Cholesterol: 130 mg/dL — ABNORMAL HIGH (ref 0–99)
TRIGLYCERIDES: 202 mg/dL — AB (ref <150)
VLDL: 40 mg/dL (ref 0–40)

## 2014-06-04 ENCOUNTER — Ambulatory Visit (INDEPENDENT_AMBULATORY_CARE_PROVIDER_SITE_OTHER): Payer: Managed Care, Other (non HMO) | Admitting: Emergency Medicine

## 2014-06-04 VITALS — BP 130/88 | HR 77 | Temp 97.8°F | Resp 18 | Ht 71.5 in | Wt 294.0 lb

## 2014-06-04 DIAGNOSIS — R1010 Upper abdominal pain, unspecified: Secondary | ICD-10-CM

## 2014-06-04 DIAGNOSIS — R1900 Intra-abdominal and pelvic swelling, mass and lump, unspecified site: Secondary | ICD-10-CM | POA: Diagnosis not present

## 2014-06-04 DIAGNOSIS — G8929 Other chronic pain: Secondary | ICD-10-CM

## 2014-06-04 DIAGNOSIS — R1011 Right upper quadrant pain: Secondary | ICD-10-CM

## 2014-06-04 LAB — POCT UA - MICROSCOPIC ONLY
Casts, Ur, LPF, POC: NEGATIVE
Crystals, Ur, HPF, POC: NEGATIVE
Epithelial cells, urine per micros: NEGATIVE
Mucus, UA: NEGATIVE
RBC, URINE, MICROSCOPIC: NEGATIVE
WBC, UR, HPF, POC: NEGATIVE
Yeast, UA: NEGATIVE

## 2014-06-04 LAB — COMPLETE METABOLIC PANEL WITH GFR
ALT: 29 U/L (ref 0–53)
AST: 21 U/L (ref 0–37)
Albumin: 4.6 g/dL (ref 3.5–5.2)
Alkaline Phosphatase: 81 U/L (ref 39–117)
BILIRUBIN TOTAL: 0.5 mg/dL (ref 0.2–1.2)
BUN: 16 mg/dL (ref 6–23)
CALCIUM: 9.4 mg/dL (ref 8.4–10.5)
CHLORIDE: 102 meq/L (ref 96–112)
CO2: 21 mEq/L (ref 19–32)
CREATININE: 1 mg/dL (ref 0.50–1.35)
GFR, Est African American: >89 mL/min
GFR, Est Non African American: 88 mL/min
Glucose, Bld: 91 mg/dL (ref 70–99)
Potassium: 4.7 mEq/L (ref 3.5–5.3)
SODIUM: 138 meq/L (ref 135–145)
TOTAL PROTEIN: 7.6 g/dL (ref 6.0–8.3)

## 2014-06-04 LAB — POCT CBC
GRANULOCYTE PERCENT: 77.7 % (ref 37–80)
HEMATOCRIT: 45 % (ref 43.5–53.7)
HEMOGLOBIN: 15 g/dL (ref 14.1–18.1)
LYMPH, POC: 1.3 (ref 0.6–3.4)
MCH, POC: 27.8 pg (ref 27–31.2)
MCHC: 33.3 g/dL (ref 31.8–35.4)
MCV: 83.6 fL (ref 80–97)
MID (cbc): 0.3 (ref 0–0.9)
MPV: 7.3 fL (ref 0–99.8)
POC GRANULOCYTE: 5.7 (ref 2–6.9)
POC LYMPH %: 18.4 % (ref 10–50)
POC MID %: 3.9 %M (ref 0–12)
Platelet Count, POC: 320 10*3/uL (ref 142–424)
RBC: 5.39 M/uL (ref 4.69–6.13)
RDW, POC: 13.3 %
WBC: 7.3 10*3/uL (ref 4.6–10.2)

## 2014-06-04 LAB — POCT URINALYSIS DIPSTICK
BILIRUBIN UA: NEGATIVE
GLUCOSE UA: NEGATIVE
Ketones, UA: NEGATIVE
Leukocytes, UA: NEGATIVE
Nitrite, UA: NEGATIVE
Protein, UA: NEGATIVE
SPEC GRAV UA: 1.01
UROBILINOGEN UA: 0.2
pH, UA: 6

## 2014-06-04 NOTE — Progress Notes (Signed)
Subjective:    Patient ID: Parker Saunders, male    DOB: Oct 04, 1965, 49 y.o.   MRN: [id number] This chart was scribed for Lesle Chris, MD by Littie Deeds, Medical Scribe. This patient was seen in room 8 and the patient's care was started at 10:11 AM.   HPI HPI Comments: Parker Saunders is a 49 y.o. male who [facility] the [facility] complaining of a knot on his right upper abdomen that was first noticed 3 days ago. He reports having some associated RUQ abdominal pain that radiates upwards. Patient denies nausea.  Review of Systems  Gastrointestinal: Positive for abdominal pain. Negative for nausea.       Objective:   Physical Exam CONSTITUTIONAL: Well developed/well nourished HEAD: Normocephalic/atraumatic EYES: EOM/PERRL ENMT: Mucous membranes moist NECK: supple no meningeal signs SPINE: entire spine nontender CV: S1/S2 noted, no murmurs/rubs/gallops noted LUNGS: Lungs are clear to auscultation bilaterally, no apparent distress ABDOMEN: Mild right upper abdominal tenderness. 1cm x 1cm soft tissue mass in the right upper abdomen, most consistent with lipoma. GU: no cva tenderness NEURO: Pt is awake/alert, moves all extremitiesx4 EXTREMITIES: pulses normal, full ROM SKIN: warm, color normal PSYCH: no abnormalities of mood noted  Results for orders placed or performed in visit on 06/04/14  POCT CBC  Result Value Ref Range   WBC 7.3 4.6 - 10.2 K/uL   Lymph, poc 1.3 0.6 - 3.4   POC LYMPH PERCENT 18.4 10 - 50 %L   MID (cbc) 0.3 0 - 0.9   POC MID % 3.9 0 - 12 %M   POC Granulocyte 5.7 2 - 6.9   Granulocyte percent 77.7 37 - 80 %G   RBC 5.39 4.69 - 6.13 M/uL   Hemoglobin 15.0 14.1 - 18.1 g/dL   HCT, POC 40.9 81.1 - 53.7 %   MCV 83.6 80 - 97 fL   MCH, POC 27.8 27 - 31.2 pg   MCHC 33.3 31.8 - 35.4 g/dL   RDW, POC 91.4 %   Platelet Count, POC 320 142 - 424 K/uL   MPV 7.3 0 - 99.8 fL  POCT urinalysis dipstick  Result Value Ref Range   Color, UA yellow    Clarity, UA clear    Glucose, UA neg    Bilirubin, UA neg    Ketones, UA neg    Spec Grav, UA 1.010    Blood, UA Trace    pH, UA 6.0    Protein, UA neg    Urobilinogen, UA 0.2    Nitrite, UA neg    Leukocytes, UA Negative   POCT UA - Microscopic Only  Result Value Ref Range   WBC, Ur, HPF, POC neg    RBC, urine, microscopic neg    Bacteria, U Microscopic Trace    Mucus, UA neg    Epithelial cells, urine per micros neg    Crystals, Ur, HPF, POC neg    Casts, Ur, LPF, POC neg    Yeast, UA neg         Assessment & Plan:  1. Abdominal mass  - US Abdomen Complete; Future. If he chooses we can proceed with excision of the suspected lipoma.  2. Abdominal pain, chronic, right upper quadrant  - POCT CBC - POCT urinalysis dipstick - POCT UA - Microscopic Only - COMPLETE METABOLIC PANEL WITH GFR - US Abdomen Complete; Future   I personally performed the services described in this documentation, which was scribed in my presence. The recorded information  has been reviewed and is accurate.  Lesle ChrisSteven Daub, MD  [facility], [facility]  06/04/2014 11:39 AM

## 2014-06-12 ENCOUNTER — Ambulatory Visit
Admission: RE | Admit: 2014-06-12 | Discharge: 2014-06-12 | Disposition: A | Discharge status: Home / Self Care | Payer: Managed Care, Other (non HMO) | Source: Ambulatory Visit | Attending: Emergency Medicine | Admitting: Emergency Medicine

## 2014-06-12 DIAGNOSIS — G8929 Other chronic pain: Secondary | ICD-10-CM

## 2014-06-12 DIAGNOSIS — R1011 Right upper quadrant pain: Secondary | ICD-10-CM

## 2014-06-12 DIAGNOSIS — R1900 Intra-abdominal and pelvic swelling, mass and lump, unspecified site: Secondary | ICD-10-CM

## 2014-06-14 ENCOUNTER — Other Ambulatory Visit: Payer: Self-pay | Admitting: Emergency Medicine

## 2014-06-14 DIAGNOSIS — Q6102 Congenital multiple renal cysts: Secondary | ICD-10-CM

## 2015-03-10 ENCOUNTER — Other Ambulatory Visit: Payer: Self-pay | Admitting: Emergency Medicine

## 2015-04-10 ENCOUNTER — Other Ambulatory Visit: Payer: Self-pay | Admitting: Physician Assistant

## 2015-04-23 ENCOUNTER — Ambulatory Visit (INDEPENDENT_AMBULATORY_CARE_PROVIDER_SITE_OTHER): Payer: Managed Care, Other (non HMO) | Admitting: Family Medicine

## 2015-04-23 ENCOUNTER — Ambulatory Visit (INDEPENDENT_AMBULATORY_CARE_PROVIDER_SITE_OTHER): Payer: Managed Care, Other (non HMO)

## 2015-04-23 VITALS — BP 128/88 | HR 72 | Temp 97.5°F | Resp 18 | Wt 268.4 lb

## 2015-04-23 DIAGNOSIS — Z1211 Encounter for screening for malignant neoplasm of colon: Secondary | ICD-10-CM | POA: Diagnosis not present

## 2015-04-23 DIAGNOSIS — M5442 Lumbago with sciatica, left side: Secondary | ICD-10-CM

## 2015-04-23 DIAGNOSIS — E785 Hyperlipidemia, unspecified: Secondary | ICD-10-CM

## 2015-04-23 DIAGNOSIS — Z131 Encounter for screening for diabetes mellitus: Secondary | ICD-10-CM

## 2015-04-23 DIAGNOSIS — Z114 Encounter for screening for human immunodeficiency virus [HIV]: Secondary | ICD-10-CM | POA: Diagnosis not present

## 2015-04-23 DIAGNOSIS — I10 Essential (primary) hypertension: Secondary | ICD-10-CM | POA: Diagnosis not present

## 2015-04-23 LAB — POCT URINALYSIS DIP (MANUAL ENTRY)
BILIRUBIN UA: NEGATIVE
GLUCOSE UA: NEGATIVE
Ketones, POC UA: NEGATIVE
Leukocytes, UA: NEGATIVE
Nitrite, UA: NEGATIVE
PH UA: 7
Protein Ur, POC: NEGATIVE
RBC UA: NEGATIVE
SPEC GRAV UA: 1.015
Urobilinogen, UA: 0.2

## 2015-04-23 LAB — CBC WITH DIFFERENTIAL/PLATELET
Basophils Absolute: 0 K/uL (ref 0.0–0.1)
Basophils Relative: 0 % (ref 0–1)
Eosinophils Absolute: 0.3 K/uL (ref 0.0–0.7)
Eosinophils Relative: 4 % (ref 0–5)
HCT: 43.6 % (ref 39.0–52.0)
Hemoglobin: 15.4 g/dL (ref 13.0–17.0)
Lymphocytes Relative: 18 % (ref 12–46)
Lymphs Abs: 1.5 K/uL (ref 0.7–4.0)
MCH: 29.2 pg (ref 26.0–34.0)
MCHC: 35.3 g/dL (ref 30.0–36.0)
MCV: 82.7 fL (ref 78.0–100.0)
MPV: 9.2 fL (ref 8.6–12.4)
Monocytes Absolute: 0.6 K/uL (ref 0.1–1.0)
Monocytes Relative: 7 % (ref 3–12)
Neutro Abs: 5.8 K/uL (ref 1.7–7.7)
Neutrophils Relative %: 71 % (ref 43–77)
Platelets: 322 K/uL (ref 150–400)
RBC: 5.27 MIL/uL (ref 4.22–5.81)
RDW: 13.2 % (ref 11.5–15.5)
WBC: 8.1 K/uL (ref 4.0–10.5)

## 2015-04-23 LAB — LIPID PANEL
Cholesterol: 203 mg/dL — ABNORMAL HIGH (ref 125–200)
HDL: 34 mg/dL — AB (ref >=40)
LDL Cholesterol: 131 mg/dL — ABNORMAL HIGH (ref <130)
Total CHOL/HDL Ratio: 6 Ratio — ABNORMAL HIGH (ref <=5.0)
Triglycerides: 191 mg/dL — ABNORMAL HIGH (ref <150)
VLDL: 38 mg/dL — ABNORMAL HIGH (ref <30)

## 2015-04-23 LAB — COMPREHENSIVE METABOLIC PANEL WITH GFR
ALT: 33 U/L (ref 9–46)
AST: 24 U/L (ref 10–35)
Albumin: 4.7 g/dL (ref 3.6–5.1)
Alkaline Phosphatase: 78 U/L (ref 40–115)
BUN: 14 mg/dL (ref 7–25)
CO2: 27 mmol/L (ref 20–31)
Calcium: 9.7 mg/dL (ref 8.6–10.3)
Chloride: 101 mmol/L (ref 98–110)
Creat: 1.01 mg/dL (ref 0.70–1.33)
Glucose, Bld: 88 mg/dL (ref 65–99)
Potassium: 4.6 mmol/L (ref 3.5–5.3)
Sodium: 137 mmol/L (ref 135–146)
Total Bilirubin: 0.6 mg/dL (ref 0.2–1.2)
Total Protein: 7.5 g/dL (ref 6.1–8.1)

## 2015-04-23 LAB — HEMOGLOBIN A1C
Hgb A1c MFr Bld: 5.3 % (ref <5.7)
Mean Plasma Glucose: 105 mg/dL (ref <117)

## 2015-04-23 LAB — TSH: TSH: 2.29 m[IU]/L (ref 0.40–4.50)

## 2015-04-23 LAB — HIV ANTIBODY (ROUTINE TESTING W REFLEX): HIV: NONREACTIVE

## 2015-04-23 MED ORDER — PREDNISONE 20 MG PO TABS: ORAL_TABLET | ORAL | Status: DC

## 2015-04-23 MED ORDER — METHOCARBAMOL 500 MG PO TABS: 500.0000 mg | ORAL_TABLET | Freq: Four times a day (QID) | ORAL | Status: DC

## 2015-04-23 MED ORDER — HYDROCODONE-ACETAMINOPHEN 5-325 MG PO TABS: 1.0000 | ORAL_TABLET | Freq: Four times a day (QID) | ORAL | Status: DC | PRN

## 2015-04-23 MED ORDER — LOSARTAN POTASSIUM 100 MG PO TABS: 100.0000 mg | ORAL_TABLET | Freq: Every day | ORAL | Status: DC

## 2015-04-23 NOTE — Progress Notes (Signed)
Subjective:    Patient ID: Parker Saunders, male    DOB: 1965-02-19, 50 y.o.   MRN: [id number]  04/23/2015  Back Pain and Medication Refill   HPI This 50 y.o. male presents for evaluation of the following:   1.  Lower back pain: onset ten days ago.  No trauma or overuse.   Got out of car to take daughter to school with acute onset of pain.  Lower back in middle.  +radiation into L leg.  Chronic radiation into L leg;  [facility] radiology suggested lumbar spine surgery ten years ago; had radicular symptoms at that time.  +n/t/burning; intermittent weakness.  B/B function normal.  No saddle paresthesias.  Ibuprofen  every 6 hours.  Nighttime awakening.  Not sleeping in bed.  Unemployed due to recent R surgery shoulder; terminated with surgery and post-op period.    2.  HTN: Patient reports good compliance with medication, good tolerance to medication, and good symptom control.  Home BP readings 130/80.    3. Preventative care: agreeable to colonoscopy and HIV screening.  No family hx of colon cancer or prostate cancer.   Review of Systems  Constitutional: Negative for fever, chills, diaphoresis, activity change, appetite change and fatigue.  Respiratory: Negative for cough and shortness of breath.   Cardiovascular: Negative for chest pain, palpitations and leg swelling.  Gastrointestinal: Negative for nausea, vomiting, abdominal pain and diarrhea.  Endocrine: Negative for cold intolerance, heat intolerance, polydipsia, polyphagia and polyuria.  Musculoskeletal: Positive for back pain.  Skin: Negative for color change, rash and wound.  Neurological: Positive for weakness and numbness. Negative for dizziness, tremors, seizures, syncope, facial asymmetry, speech difficulty, light-headedness and headaches.  Psychiatric/Behavioral: Negative for sleep disturbance and dysphoric mood. The patient is not nervous/anxious.     Past Medical History  Diagnosis Date  . Renal stone   . Rotator  cuff disorder   . Hypertension    Past Surgical History  Procedure Laterality Date  . Back surgery  2005  . Shoulder surgery Right 2014  . Spine surgery  02/07/2005    [facility]   No Known Allergies  Social History   Social History  . Marital Status: Married    Spouse Name: N/A  . Number of Children: 2  . Years of Education: N/A   Occupational History  . unemployed     since 2015   Social History Main Topics  . Smoking status: Never Smoker   . Smokeless tobacco: Not on file  . Alcohol Use: No  . Drug Use: No  . Sexual Activity: Yes   Other Topics Concern  . Not on file   Social History Narrative   Marital status: married      Children: 2 daughters      Lives: with wife, 2 daughters      Employment: unemployed after termination in 2015     Tobacco: none      Alcohol: none      Exercise:  Walking 4-5 miles daily   Family History  Problem Relation Age of Onset  . Hyperlipidemia Mother   . Hypertension Mother   . COPD Father   . Hyperlipidemia Father   . Hypertension Father        Objective:    BP 128/88 mmHg  Pulse 72  Temp(Src) 97.5 F (36.4 C) (Oral)  Resp 18  Wt 268 lb 6.4 oz (121.745 kg)  SpO2 98% Physical Exam  Constitutional: He is oriented to person, place, and time.  He appears well-developed and well-nourished. No distress.  HENT:  Head: Normocephalic and atraumatic.  Right Ear: Tympanic membrane, external ear and ear canal normal.  Left Ear: Tympanic membrane, external ear and ear canal normal.  Nose: Nose normal.  Mouth/Throat: Oropharynx is clear and moist.  Eyes: Conjunctivae and EOM are normal. Pupils are equal, round, and reactive to light.  Neck: Normal range of motion. Neck supple. Carotid bruit is not present. No thyromegaly present.  Cardiovascular: Normal rate, regular rhythm, normal heart sounds and intact distal pulses.  Exam reveals no gallop and no friction rub.   No murmur heard. Pulmonary/Chest: Effort normal and  breath sounds normal. He has no wheezes. He has no rales.  Abdominal: Soft. Bowel sounds are normal. He exhibits no distension and no mass. There is no tenderness. There is no rebound and no guarding.  Musculoskeletal:       Lumbar back: He exhibits decreased range of motion and pain. He exhibits no tenderness, no bony tenderness and no spasm.  Lumbar spine:  Non-tender midline; non-tender paraspinal regions B.  Straight leg raises negative B; toe and heel walking intact; marching intact; motor 5/5 BLE.  Full ROM lumbar spine yet painful ROM in all directions.   Lymphadenopathy:    He has no cervical adenopathy.  Neurological: He is alert and oriented to person, place, and time. No cranial nerve deficit.  Skin: Skin is warm and dry. No rash noted. He is not diaphoretic.  Psychiatric: He has a normal mood and affect. His behavior is normal.  Nursing note and vitals reviewed.       Assessment & Plan:   1. Essential hypertension, benign   2. Hyperlipidemia   3. Midline low back pain with left-sided sciatica   4. Screening for diabetes mellitus   5. Screening for HIV (human immunodeficiency virus)   6. Colon cancer screening     Orders Placed This Encounter  Procedures  . DG Lumbar Spine Complete    Standing Status: Future     Number of Occurrences: 1     Standing Expiration Date: 04/22/2016    Order Specific Question:  Reason for Exam (SYMPTOM  OR DIAGNOSIS REQUIRED)    Answer:  lower back pain midline with radiation into L thigh with numbness; history of lumbar spine surgery    Order Specific Question:  Preferred imaging location?    Answer:  External  . CBC with Differential/Platelet  . Comprehensive metabolic panel    Order Specific Question:  Has the patient fasted?    Answer:  Yes  . Lipid panel    Order Specific Question:  Has the patient fasted?    Answer:  Yes  . TSH  . Hemoglobin A1c  . HIV antibody  . Ambulatory referral to Gastroenterology    Referral Priority:   Routine    Referral Type:  Consultation    Referral Reason:  Specialty Services Required    Number of Visits Requested:  1  . POCT urinalysis dipstick   Meds ordered this encounter  Medications  . losartan (COZAAR) 100 MG tablet    Sig: Take 1 tablet (100 mg total) by mouth daily.    Dispense:  90 tablet    Refill:  3    Office visit needed for refills  . predniSONE (DELTASONE) 20 MG tablet    Sig: Three tablets daily x 2 days then two tablets daily x 5 days then one tablet daily x 5 days    Dispense:  21 tablet    Refill:  0  . methocarbamol (ROBAXIN) 500 MG tablet    Sig: Take 1 tablet (500 mg total) by mouth 4 (four) times daily.    Dispense:  40 tablet    Refill:  0  . HYDROcodone-acetaminophen (NORCO) 5-325 MG tablet    Sig: Take 1 tablet by mouth every 6 (six) hours as needed.    Dispense:  30 tablet    Refill:  0    Return in about 6 months (around 10/24/2015) for complete physical examiniation.    Rebakah Cokley Paulita FujitaMartin Shyna Duignan, M.D. [facility]  [place] [address] [place], [place]  [id number] [phone] phone [phone] fax

## 2015-04-23 NOTE — Patient Instructions (Addendum)
   IF you received an x-ray today, you will receive an invoice from [facility]. Please contact [facility] at [phone] with questions or concerns regarding your invoice.   IF you received labwork today, you will receive an invoice from [facility]. Please contact [facility] at [phone] with questions or concerns regarding your invoice.   Our billing staff will not be able to assist you with questions regarding bills from these companies.  You will be contacted with the lab results as soon as they are available. The fastest way to get your results is to activate your My Chart account. Instructions are located on the last page of this paperwork. If you have not heard from us regarding the results in 2 weeks, please contact this office.     Low Back Sprain With Rehab A sprain is an injury in which a ligament is torn. The ligaments of the lower back are vulnerable to sprains. However, they are strong and require great force to be injured. These ligaments are important for stabilizing the spinal column. Sprains are classified into three categories. Grade 1 sprains cause pain, but the tendon is not lengthened. Grade 2 sprains include a lengthened ligament, due to the ligament being stretched or partially ruptured. With grade 2 sprains there is still function, although the function may be decreased. Grade 3 sprains involve a complete tear of the tendon or muscle, and function is usually impaired. SYMPTOMS   Severe pain in the lower back.  Sometimes, a feeling of a "pop," "snap," or tear, at the time of injury.  Tenderness and sometimes swelling at the injury site.  Uncommonly, bruising (contusion) within 48 hours of injury.  Muscle spasms in the back. CAUSES  Low back sprains occur when a force is placed on the ligaments that is greater than they can handle. Common causes of injury include:  Performing a stressful act while  off-balance.  Repetitive stressful activities that involve movement of the lower back.  Direct hit (trauma) to the lower back. RISK INCREASES WITH:  Contact sports (football, wrestling).  Collisions (major skiing accidents).  Sports that require throwing or lifting (baseball, weightlifting).  Sports involving twisting of the spine (gymnastics, diving, tennis, golf).  Poor strength and flexibility.  Inadequate protection.  Previous back injury or surgery (especially fusion). PREVENTION  Wear properly fitted and padded protective equipment.  Warm up and stretch properly before activity.  Allow for adequate recovery between workouts.  Maintain physical fitness:  Strength, flexibility, and endurance.  Cardiovascular fitness.  Maintain a healthy body weight. PROGNOSIS  If treated properly, low back sprains usually heal with non-surgical treatment. The length of time for healing depends on the severity of the injury.  RELATED COMPLICATIONS   Recurring symptoms, resulting in a chronic problem.  Chronic inflammation and pain in the low back.  Delayed healing or resolution of symptoms, especially if activity is resumed too soon.  Prolonged impairment.  Unstable or arthritic joints of the low back. TREATMENT  Treatment first involves the use of ice and medicine, to reduce pain and inflammation. The use of strengthening and stretching exercises may help reduce pain with activity. These exercises may be performed at home or with a therapist. Severe injuries may require referral to a therapist for further evaluation and treatment, such as ultrasound. Your caregiver may advise that you wear a back brace or corset, to help reduce pain and discomfort. Often, prolonged bed rest results in greater harm then benefit. Corticosteroid injections may   be recommended. However, these should be reserved for the most serious cases. It is important to avoid using your back when lifting objects.  At night, sleep on your back on a firm mattress, with a pillow placed under your knees. If non-surgical treatment is unsuccessful, surgery may be needed.  MEDICATION   If pain medicine is needed, nonsteroidal anti-inflammatory medicines (aspirin and ibuprofen), or other minor pain relievers (acetaminophen), are often advised.  Do not take pain medicine for 7 days before surgery.  Prescription pain relievers may be given, if your caregiver thinks they are needed. Use only as directed and only as much as you need.  Ointments applied to the skin may be helpful.  Corticosteroid injections may be given by your caregiver. These injections should be reserved for the most serious cases, because they may only be given a certain number of times. HEAT AND COLD  Cold treatment (icing) should be applied for 10 to 15 minutes every 2 to 3 hours for inflammation and pain, and immediately after activity that aggravates your symptoms. Use ice packs or an ice massage.  Heat treatment may be used before performing stretching and strengthening activities prescribed by your caregiver, physical therapist, or athletic trainer. Use a heat pack or a warm water soak. SEEK MEDICAL CARE IF:   Symptoms get worse or do not improve in 2 to 4 weeks, despite treatment.  You develop numbness or weakness in either leg.  You lose bowel or bladder function.  Any of the following occur after surgery: fever, increased pain, swelling, redness, drainage of fluids, or bleeding in the affected area.  New, unexplained symptoms develop. (Drugs used in treatment may produce side effects.) EXERCISES  RANGE OF MOTION (ROM) AND STRETCHING EXERCISES - Low Back Sprain Most people with lower back pain will find that their symptoms get worse with excessive bending forward (flexion) or arching at the lower back (extension). The exercises that will help resolve your symptoms will focus on the opposite motion.  Your physician, physical  therapist or athletic trainer will help you determine which exercises will be most helpful to resolve your lower back pain. Do not complete any exercises without first consulting with your caregiver. Discontinue any exercises which make your symptoms worse, until you speak to your caregiver. If you have pain, numbness or tingling which travels down into your buttocks, leg or foot, the goal of the therapy is for these symptoms to move closer to your back and eventually resolve. Sometimes, these leg symptoms will get better, but your lower back pain may worsen. This is often an indication of progress in your rehabilitation. Be very alert to any changes in your symptoms and the activities in which you participated in the 24 hours prior to the change. Sharing this information with your caregiver will allow him or her to most efficiently treat your condition. These exercises may help you when beginning to rehabilitate your injury. Your symptoms may resolve with or without further involvement from your physician, physical therapist or athletic trainer. While completing these exercises, remember:   Restoring tissue flexibility helps normal motion to return to the joints. This allows healthier, less painful movement and activity.  An effective stretch should be held for at least 30 seconds.  A stretch should never be painful. You should only feel a gentle lengthening or release in the stretched tissue. FLEXION RANGE OF MOTION AND STRETCHING EXERCISES: STRETCH - Flexion, Single Knee to Chest   Lie on a firm bed or floor with   both legs extended in front of you.  Keeping one leg in contact with the floor, bring your opposite knee to your chest. Hold your leg in place by either grabbing behind your thigh or at your knee.  Pull until you feel a gentle stretch in your low back. Hold __________ seconds.  Slowly release your grasp and repeat the exercise with the opposite side. Repeat __________ times. Complete  this exercise __________ times per day.  STRETCH - Flexion, Double Knee to Chest  Lie on a firm bed or floor with both legs extended in front of you.  Keeping one leg in contact with the floor, bring your opposite knee to your chest.  Tense your stomach muscles to support your back and then lift your other knee to your chest. Hold your legs in place by either grabbing behind your thighs or at your knees.  Pull both knees toward your chest until you feel a gentle stretch in your low back. Hold __________ seconds.  Tense your stomach muscles and slowly return one leg at a time to the floor. Repeat __________ times. Complete this exercise __________ times per day.  STRETCH - Low Trunk Rotation  Lie on a firm bed or floor. Keeping your legs in front of you, bend your knees so they are both pointed toward the ceiling and your feet are flat on the floor.  Extend your arms out to the side. This will stabilize your upper body by keeping your shoulders in contact with the floor.  Gently and slowly drop both knees together to one side until you feel a gentle stretch in your low back. Hold for __________ seconds.  Tense your stomach muscles to support your lower back as you bring your knees back to the starting position. Repeat the exercise to the other side. Repeat __________ times. Complete this exercise __________ times per day  EXTENSION RANGE OF MOTION AND FLEXIBILITY EXERCISES: STRETCH - Extension, Prone on Elbows   Lie on your stomach on the floor, a bed will be too soft. Place your palms about shoulder width apart and at the height of your head.  Place your elbows under your shoulders. If this is too painful, stack pillows under your chest.  Allow your body to relax so that your hips drop lower and make contact more completely with the floor.  Hold this position for __________ seconds.  Slowly return to lying flat on the floor. Repeat __________ times. Complete this exercise  __________ times per day.  RANGE OF MOTION - Extension, Prone Press Ups  Lie on your stomach on the floor, a bed will be too soft. Place your palms about shoulder width apart and at the height of your head.  Keeping your back as relaxed as possible, slowly straighten your elbows while keeping your hips on the floor. You may adjust the placement of your hands to maximize your comfort. As you gain motion, your hands will come more underneath your shoulders.  Hold this position __________ seconds.  Slowly return to lying flat on the floor. Repeat __________ times. Complete this exercise __________ times per day.  RANGE OF MOTION- Quadruped, Neutral Spine   Assume a hands and knees position on a firm surface. Keep your hands under your shoulders and your knees under your hips. You may place padding under your knees for comfort.  Drop your head and point your tailbone toward the ground below you. This will round out your lower back like an angry cat. Hold this position   for __________ seconds.  Slowly lift your head and release your tail bone so that your back sags into a large arch, like an old horse.  Hold this position for __________ seconds.  Repeat this until you feel limber in your low back.  Now, find your "sweet spot." This will be the most comfortable position somewhere between the two previous positions. This is your neutral spine. Once you have found this position, tense your stomach muscles to support your low back.  Hold this position for __________ seconds. Repeat __________ times. Complete this exercise __________ times per day.  STRENGTHENING EXERCISES - Low Back Sprain These exercises may help you when beginning to rehabilitate your injury. These exercises should be done near your "sweet spot." This is the neutral, low-back arch, somewhere between fully rounded and fully arched, that is your least painful position. When performed in this safe range of motion, these exercises  can be used for people who have either a flexion or extension based injury. These exercises may resolve your symptoms with or without further involvement from your physician, physical therapist or athletic trainer. While completing these exercises, remember:   Muscles can gain both the endurance and the strength needed for everyday activities through controlled exercises.  Complete these exercises as instructed by your physician, physical therapist or athletic trainer. Increase the resistance and repetitions only as guided.  You may experience muscle soreness or fatigue, but the pain or discomfort you are trying to eliminate should never worsen during these exercises. If this pain does worsen, stop and make certain you are following the directions exactly. If the pain is still present after adjustments, discontinue the exercise until you can discuss the trouble with your caregiver. STRENGTHENING - Deep Abdominals, Pelvic Tilt   Lie on a firm bed or floor. Keeping your legs in front of you, bend your knees so they are both pointed toward the ceiling and your feet are flat on the floor.  Tense your lower abdominal muscles to press your low back into the floor. This motion will rotate your pelvis so that your tail bone is scooping upwards rather than pointing at your feet or into the floor. With a gentle tension and even breathing, hold this position for __________ seconds. Repeat __________ times. Complete this exercise __________ times per day.  STRENGTHENING - Abdominals, Crunches   Lie on a firm bed or floor. Keeping your legs in front of you, bend your knees so they are both pointed toward the ceiling and your feet are flat on the floor. Cross your arms over your chest.  Slightly tip your chin down without bending your neck.  Tense your abdominals and slowly lift your trunk high enough to just clear your shoulder blades. Lifting higher can put excessive stress on the lower back and does not  further strengthen your abdominal muscles.  Control your return to the starting position. Repeat __________ times. Complete this exercise __________ times per day.  STRENGTHENING - Quadruped, Opposite UE/LE Lift   Assume a hands and knees position on a firm surface. Keep your hands under your shoulders and your knees under your hips. You may place padding under your knees for comfort.  Find your neutral spine and gently tense your abdominal muscles so that you can maintain this position. Your shoulders and hips should form a rectangle that is parallel with the floor and is not twisted.  Keeping your trunk steady, lift your right hand no higher than your shoulder and then your left   leg no higher than your hip. Make sure you are not holding your breath. Hold this position for __________ seconds.  Continuing to keep your abdominal muscles tense and your back steady, slowly return to your starting position. Repeat with the opposite arm and leg. Repeat __________ times. Complete this exercise __________ times per day.  STRENGTHENING - Abdominals and Quadriceps, Straight Leg Raise   Lie on a firm bed or floor with both legs extended in front of you.  Keeping one leg in contact with the floor, bend the other knee so that your foot can rest flat on the floor.  Find your neutral spine, and tense your abdominal muscles to maintain your spinal position throughout the exercise.  Slowly lift your straight leg off the floor about 6 inches for a count of 15, making sure to not hold your breath.  Still keeping your neutral spine, slowly lower your leg all the way to the floor. Repeat this exercise with each leg __________ times. Complete this exercise __________ times per day. POSTURE AND BODY MECHANICS CONSIDERATIONS - Low Back Sprain Keeping correct posture when sitting, standing or completing your activities will reduce the stress put on different body tissues, allowing injured tissues a chance to heal  and limiting painful experiences. The following are general guidelines for improved posture. Your physician or physical therapist will provide you with any instructions specific to your needs. While reading these guidelines, remember:  The exercises prescribed by your provider will help you have the flexibility and strength to maintain correct postures.  The correct posture provides the best environment for your joints to work. All of your joints have less wear and tear when properly supported by a spine with good posture. This means you will experience a healthier, less painful body.  Correct posture must be practiced with all of your activities, especially prolonged sitting and standing. Correct posture is as important when doing repetitive low-stress activities (typing) as it is when doing a single heavy-load activity (lifting). RESTING POSITIONS Consider which positions are most painful for you when choosing a resting position. If you have pain with flexion-based activities (sitting, bending, stooping, squatting), choose a position that allows you to rest in a less flexed posture. You would want to avoid curling into a fetal position on your side. If your pain worsens with extension-based activities (prolonged standing, working overhead), avoid resting in an extended position such as sleeping on your stomach. Most people will find more comfort when they rest with their spine in a more neutral position, neither too rounded nor too arched. Lying on a non-sagging bed on your side with a pillow between your knees, or on your back with a pillow under your knees will often provide some relief. Keep in mind, being in any one position for a prolonged period of time, no matter how correct your posture, can still lead to stiffness. PROPER SITTING POSTURE In order to minimize stress and discomfort on your spine, you must sit with correct posture. Sitting with good posture should be effortless for a healthy body.  Returning to good posture is a gradual process. Many people can work toward this most comfortably by using various supports until they have the flexibility and strength to maintain this posture on their own. When sitting with proper posture, your ears will fall over your shoulders and your shoulders will fall over your hips. You should use the back of the chair to support your upper back. Your lower back will be in a neutral   position, just slightly arched. You may place a small pillow or folded towel at the base of your lower back for  support.  When working at a desk, create an environment that supports good, upright posture. Without extra support, muscles tire, which leads to excessive strain on joints and other tissues. Keep these recommendations in mind: CHAIR:  A chair should be able to slide under your desk when your back makes contact with the back of the chair. This allows you to work closely.  The chair's height should allow your eyes to be level with the upper part of your monitor and your hands to be slightly lower than your elbows. BODY POSITION  Your feet should make contact with the floor. If this is not possible, use a foot rest.  Keep your ears over your shoulders. This will reduce stress on your neck and low back. INCORRECT SITTING POSTURES  If you are feeling tired and unable to assume a healthy sitting posture, do not slouch or slump. This puts excessive strain on your back tissues, causing more damage and pain. Healthier options include:  Using more support, like a lumbar pillow.  Switching tasks to something that requires you to be upright or walking.  Talking a brief walk.  Lying down to rest in a neutral-spine position. PROLONGED STANDING WHILE SLIGHTLY LEANING FORWARD  When completing a task that requires you to lean forward while standing in one place for a long time, place either foot up on a stationary 2-4 inch high object to help maintain the best posture. When  both feet are on the ground, the lower back tends to lose its slight inward curve. If this curve flattens (or becomes too large), then the back and your other joints will experience too much stress, tire more quickly, and can cause pain. CORRECT STANDING POSTURES Proper standing posture should be assumed with all daily activities, even if they only take a few moments, like when brushing your teeth. As in sitting, your ears should fall over your shoulders and your shoulders should fall over your hips. You should keep a slight tension in your abdominal muscles to brace your spine. Your tailbone should point down to the ground, not behind your body, resulting in an over-extended swayback posture.  INCORRECT STANDING POSTURES  Common incorrect standing postures include a forward head, locked knees and/or an excessive swayback. WALKING Walk with an upright posture. Your ears, shoulders and hips should all line-up. PROLONGED ACTIVITY IN A FLEXED POSITION When completing a task that requires you to bend forward at your waist or lean over a low surface, try to find a way to stabilize 3 out of 4 of your limbs. You can place a hand or elbow on your thigh or rest a knee on the surface you are reaching across. This will provide you more stability, so that your muscles do not tire as quickly. By keeping your knees relaxed, or slightly bent, you will also reduce stress across your lower back. CORRECT LIFTING TECHNIQUES DO :  Assume a wide stance. This will provide you more stability and the opportunity to get as close as possible to the object which you are lifting.  Tense your abdominals to brace your spine. Bend at the knees and hips. Keeping your back locked in a neutral-spine position, lift using your leg muscles. Lift with your legs, keeping your back straight.  Test the weight of unknown objects before attempting to lift them.  Try to keep your elbows locked down   at your sides in order get the best  strength from your shoulders when carrying an object.  Always ask for help when lifting heavy or awkward objects. INCORRECT LIFTING TECHNIQUES DO NOT:   Lock your knees when lifting, even if it is a small object.  Bend and twist. Pivot at your feet or move your feet when needing to change directions.  Assume that you can safely pick up even a paperclip without proper posture.   This information is not intended to replace advice given to you by your health care provider. Make sure you discuss any questions you have with your health care provider.   Document Released: 01/24/2005 Document Revised: 02/14/2014 Document Reviewed: 05/08/2008 [employer] Interactive Patient Education 2016 [employer]  

## 2015-05-10 ENCOUNTER — Encounter: Payer: Self-pay | Admitting: Family Medicine

## 2015-05-10 MED ORDER — ATORVASTATIN CALCIUM 10 MG PO TABS: 10.0000 mg | ORAL_TABLET | Freq: Every day | ORAL | Status: DC

## 2015-05-10 NOTE — Addendum Note (Signed)
Addended by: Ethelda ChickSMITH, KRISTI M on: 05/10/2015 02:41 PM   Modules accepted: Orders

## 2016-01-12 ENCOUNTER — Ambulatory Visit: Payer: Self-pay | Admitting: Orthopedic Surgery

## 2016-01-14 ENCOUNTER — Ambulatory Visit: Payer: Self-pay | Admitting: Orthopedic Surgery

## 2016-01-14 NOTE — H&P (Signed)
Parker Saunders is an 50 y.o. male.   Chief Complaint: back and leg pain HPI: The patient is a 50 year old male who presents today for follow up of their back. The patient is being followed for their back and neck pain. They are now year(s) out from when symptoms began. Symptoms reported today include: pain. Current treatment includes: activity modification, NSAIDs (Ibuprofen) and Gabapentin. The patient reports their current pain level to be 10 / 10. The patient presents today following MRI.  He follows up. He has had an MRI of his cervical and lumbar spine.  He reports a 10/10 pain in the left leg, radiates down in the bottom of his foot. He does have neck and intermittent upper extremity pain. He is here with his wife.  Past Medical History:  Diagnosis Date  . Hypertension   . Renal stone   . Rotator cuff disorder     Past Surgical History:  Procedure Laterality Date  . BACK SURGERY  2005  . SHOULDER SURGERY Right 2014  . SPINE SURGERY  02/07/2005   [facility]    Family History  Problem Relation Age of Onset  . Hyperlipidemia Mother   . Hypertension Mother   . COPD Father   . Hyperlipidemia Father   . Hypertension Father    Social History:  reports that he has never smoked. He does not have any smokeless tobacco history on file. He reports that he does not drink alcohol or use drugs.  Allergies: No Known Allergies   (Not in a hospital admission)  No results found for this or any previous visit (from the past 48 hour(s)). No results found.  Review of Systems  Constitutional: Negative.   HENT: Negative.   Eyes: Negative.   Respiratory: Negative.   Cardiovascular: Negative.   Gastrointestinal: Negative.   Genitourinary: Negative.   Musculoskeletal: Positive for back pain.  Skin: Negative.   Neurological: Positive for sensory change and focal weakness.    There were no vitals taken for this visit. Physical Exam  Constitutional: He is oriented to person,  place, and time. He appears well-developed.  HENT:  Head: Normocephalic.  Eyes: Pupils are equal, round, and reactive to light.  Neck: Normal range of motion.  Cardiovascular: Normal rate.   Respiratory: Effort normal.  GI: Soft.  Musculoskeletal:  Moderate distress. Walks with an antalgic gait. Mood and affect is appropriate. Straight leg raising, left buttock, thigh and calf pain; negative on the right. EHL is 4/5 on left compared to the right. Altered sensation in L5 dermatome.  Lumbar spine exam reveals no evidence of soft tissue swelling, deformity or skin ecchymosis. On palpation there is no tenderness of the lumbar spine. No flank pain with percussion. The abdomen is soft and nontender. Nontender over the trochanters. No cellulitis or lymphadenopathy.  Good range of motion of the lumbar spine without associated pain. Motor is 5/5 including tibialis anterior, plantar flexion, quadriceps and hamstrings. Patient is normoreflexic. There is no Babinski or clonus. Sensory exam is intact to light touch. Patient has good distal pulses. No DVT. No pain and normal range of motion without instability of the hips, knees and ankles.  Cervical spine: Pain with end extension and flexion of the cervical spine. Inspection of the cervical spine reveals a normal lordosis without evidence of paraspinous spasms or soft tissue swelling. Nontender to palpation. Full flexion, full extension, full left and right lateral rotation. Negative impingement sign, negative secondary impingement sign of the shoulders. Negative Tinel's median  and ulnar nerves at the elbow. Negative carpal compression test at the wrist. Motor of the upper extremities is 5/5 including biceps, triceps, brachioradialis, wrist flexion, wrist extension, finger flexion, finger extension. Reflexes are normoreflexic. Sensory exam is intact to light touch. There is no Hoffmann sign. Nontender over the thoracic spine.  Neurological: He is alert and  oriented to person, place, and time.  Skin: Skin is warm and dry.  Psychiatric: He has a normal mood and affect.    MRI demonstrate severe lateral recess stenosis on the left, compression of the 5 rib. He has moderate to severe foraminal stenosis L5-S1 on the left.  Cervical spine demonstrates cervical spondylosis multilevel with neural foraminal stenosis at multilevel, C5-6, C6-7.  Assessment/Plan 1. Refractory L5 radiculopathy secondary to lateral recess stenosis at 4-5, 5-1. Myotomal weakness, dermatomal dysesthesias. 2. Axial cervical pain. Intermittent radicular pain secondary to cervical spondylosis 5-6, 6-7. No focal neurologic deficit.  1. Extensive discussion with Parker Saunders concerning his current pathology, relevant anatomy, treatment options. He has reported persistent symptoms. Given the neural compression on the left, refractory to conservative treatment, discussed microlumbar decompression at 4-5, possible hemilaminectomy of L5 and foraminotomy L5 on the left. 2. Conservative with cervical spine, activity modification, strategies to avoid re-injury. Avoid extension, favor flexion. We discussed selective nerve blocks, etc. His main problem today is his leg and back. We will proceed with decompression. If in the interim he is to call. We discussed this. We discussed epidurals. He does not want to do that. He tried that previously and had no relief from that. Given the neural compression and the neurologic deficit, I do not feel that would be appropriate and would only add to the delay and the increased risk of a battered root syndrome.  Plan microlumbar decompression L4-5, L5-S1 left  Parker Saunders, Parker Devaux M., PA-C for Dr. Shelle IronBeane 01/14/2016, 3:44 PM

## 2016-01-21 ENCOUNTER — Encounter (HOSPITAL_COMMUNITY): Payer: Self-pay

## 2016-01-21 ENCOUNTER — Other Ambulatory Visit (HOSPITAL_COMMUNITY): Payer: Self-pay | Admitting: *Deleted

## 2016-01-21 NOTE — Patient Instructions (Signed)
Parker Saunders  01/21/2016   Your procedure is scheduled on: 02-04-16  [facility]  [facility]  [facility]vators to 3rd floor to  Short Stay Center at 815 AM.  Call this number if you have problems the morning of surgery [phone]   Remember: ONLY 1 PERSON MAY GO WITH YOU TO SHORT STAY TO GET  READY MORNING OF YOUR SURGERY.  Do not eat food or drink liquids :After Midnight.     Take these medicines the morning of surgery with A SIP OF WATER: none                               You may not have any metal on your body including hair pins and              piercings  Do not wear jewelry, make-up, lotions, powders or perfumes, deodorant             Do not wear nail polish.  Do not shave  48 hours prior to surgery.              Men may shave face and neck.   Do not bring valuables to the hospital. [facility] IS NOT             RESPONSIBLE   FOR VALUABLES.  Contacts, dentures or bridgework may not be worn into surgery.  Leave suitcase in the car. After surgery it may be brought to your room.                  Please read over the following fact sheets you were given: _____________________________________________________________________             [facility] - Pre[facility] Before surgery, you can play an important role.  Because skin is not sterile, your skin needs to be as free of germs as possible.  You can reduce the number of germs on your skin by washing with CHG (chlorahexidine gluconate) soap before surgery.  CHG is an antiseptic cleaner which kills germs and bonds with the skin to continue killing germs even after washing. Please DO NOT use if you have an allergy to CHG or antibacterial soaps.  If your skin becomes reddened/irritated stop using the CHG and inform your nurse when you arrive at Short Stay. Do not shave (including legs and underarms) for at least 48 hours prior to the first CHG shower.  You may shave your  face/neck. Please follow these instructions carefully:  1.  Shower with CHG Soap the night before surgery and the  morning of Surgery.  2.  If you choose to wash your hair, wash your hair first as usual with your  normal  shampoo.  3.  After you shampoo, rinse your hair and body thoroughly to remove the  shampoo.                           4.  Use CHG as you would any other liquid soap.  You can apply chg directly  to the skin and wash                       Gently with a scrungie or clean washcloth.  5.  Apply the CHG Soap to your  body ONLY FROM THE NECK DOWN.   Do not use on face/ open                           Wound or open sores. Avoid contact with eyes, ears mouth and genitals (private parts).                       Wash face,  Genitals (private parts) with your normal soap.             6.  Wash thoroughly, paying special attention to the area where your surgery  will be performed.  7.  Thoroughly rinse your body with warm water from the neck down.  8.  DO NOT shower/wash with your normal soap after using and rinsing off  the CHG Soap.                9.  Pat yourself dry with a clean towel.            10.  Wear clean pajamas.            11.  Place clean sheets on your bed the night of your first shower and do not  sleep with pets. Day of Surgery : Do not apply any lotions/deodorants the morning of surgery.  Please wear clean clothes to the hospital/surgery center.  FAILURE TO FOLLOW THESE INSTRUCTIONS MAY RESULT IN THE CANCELLATION OF YOUR SURGERY PATIENT SIGNATURE_________________________________  NURSE SIGNATURE__________________________________  ________________________________________________________________________   Parker Saunders  An incentive spirometer is a tool that can help keep your lungs clear and active. This tool measures how well you are filling your lungs with each breath. Taking long deep breaths may help reverse or decrease the chance of developing breathing  (pulmonary) problems (especially infection) following:  A long period of time when you are unable to move or be active. BEFORE THE PROCEDURE   If the spirometer includes an indicator to show your best effort, your nurse or respiratory therapist will set it to a desired goal.  If possible, sit up straight or lean slightly forward. Try not to slouch.  Hold the incentive spirometer in an upright position. INSTRUCTIONS FOR USE  1. Sit on the edge of your bed if possible, or sit up as far as you can in bed or on a chair. 2. Hold the incentive spirometer in an upright position. 3. Breathe out normally. 4. Place the mouthpiece in your mouth and seal your lips tightly around it. 5. Breathe in slowly and as deeply as possible, raising the piston or the ball toward the top of the column. 6. Hold your breath for 3-5 seconds or for as long as possible. Allow the piston or ball to fall to the bottom of the column. 7. Remove the mouthpiece from your mouth and breathe out normally. 8. Rest for a few seconds and repeat Steps 1 through 7 at least 10 times every 1-2 hours when you are awake. Take your time and take a few normal breaths between deep breaths. 9. The spirometer may include an indicator to show your best effort. Use the indicator as a goal to work toward during each repetition. 10. After each set of 10 deep breaths, practice coughing to be sure your lungs are clear. If you have an incision (the cut made at the time of surgery), support your incision when coughing by placing a pillow or rolled up towels firmly against it.  Once you are able to get out of bed, walk around indoors and cough well. You may stop using the incentive spirometer when instructed by your caregiver.  RISKS AND COMPLICATIONS  Take your time so you do not get dizzy or light-headed.  If you are in pain, you may need to take or ask for pain medication before doing incentive spirometry. It is harder to take a deep breath if you  are having pain. AFTER USE  Rest and breathe slowly and easily.  It can be helpful to keep track of a log of your progress. Your caregiver can provide you with a simple table to help with this. If you are using the spirometer at home, follow these instructions: [place] IF:   You are having difficultly using the spirometer.  You have trouble using the spirometer as often as instructed.  Your pain medication is not giving enough relief while using the spirometer.  You develop fever of 100.5 F (38.1 C) or higher. SEEK IMMEDIATE MEDICAL CARE IF:   You cough up bloody sputum that had not been present before.  You develop fever of 102 F (38.9 C) or greater.  You develop worsening pain at or near the incision site. MAKE SURE YOU:   Understand these instructions.  Will watch your condition.  Will get help right away if you are not doing well or get worse. Document Released: 06/06/2006 Document Revised: 04/18/2011 Document Reviewed: 08/07/2006 ExitCare Patient Information 2014 ExitCare, [place].   ________________________________________________________________________  WHAT IS A BLOOD TRANSFUSION? Blood Transfusion Information  A transfusion is the replacement of blood or some of its parts. Blood is made up of multiple cells which provide different functions.  Red blood cells carry oxygen and are used for blood loss replacement.  White blood cells fight against infection.  Platelets control bleeding.  Plasma helps clot blood.  Other blood products are available for specialized needs, such as hemophilia or other clotting disorders. BEFORE THE TRANSFUSION  Who gives blood for transfusions?   Healthy volunteers who are fully evaluated to make sure their blood is safe. This is blood bank blood. Transfusion therapy is the safest it has ever been in the practice of medicine. Before blood is taken from a donor, a complete history is taken to make sure that person has  no history of diseases nor engages in risky social behavior (examples are intravenous drug use or sexual activity with multiple partners). The donor's travel history is screened to minimize risk of transmitting infections, such as malaria. The donated blood is tested for signs of infectious diseases, such as HIV and hepatitis. The blood is then tested to be sure it is compatible with you in order to minimize the chance of a transfusion reaction. If you or a relative donates blood, this is often done in anticipation of surgery and is not appropriate for emergency situations. It takes many days to process the donated blood. RISKS AND COMPLICATIONS Although transfusion therapy is very safe and saves many lives, the main dangers of transfusion include:   Getting an infectious disease.  Developing a transfusion reaction. This is an allergic reaction to something in the blood you were given. Every precaution is taken to prevent this. The decision to have a blood transfusion has been considered carefully by your caregiver before blood is given. Blood is not given unless the benefits outweigh the risks. AFTER THE TRANSFUSION  Right after receiving a blood transfusion, you will usually feel much better and more energetic.  This is especially true if your red blood cells have gotten low (anemic). The transfusion raises the level of the red blood cells which carry oxygen, and this usually causes an energy increase.  The nurse administering the transfusion will monitor you carefully for complications. HOME CARE INSTRUCTIONS  No special instructions are needed after a transfusion. You may find your energy is better. Speak with your caregiver about any limitations on activity for underlying diseases you may have. SEEK MEDICAL CARE IF:   Your condition is not improving after your transfusion.  You develop redness or irritation at the intravenous (IV) site. SEEK IMMEDIATE MEDICAL CARE IF:  Any of the following  symptoms occur over the next 12 hours:  Shaking chills.  You have a temperature by mouth above 102 F (38.9 C), not controlled by medicine.  Chest, back, or muscle pain.  People around you feel you are not acting correctly or are confused.  Shortness of breath or difficulty breathing.  Dizziness and fainting.  You get a rash or develop hives.  You have a decrease in urine output.  Your urine turns a dark color or changes to pink, red, or brown. Any of the following symptoms occur over the next 10 days:  You have a temperature by mouth above 102 F (38.9 C), not controlled by medicine.  Shortness of breath.  Weakness after normal activity.  The white part of the eye turns yellow (jaundice).  You have a decrease in the amount of urine or are urinating less often.  Your urine turns a dark color or changes to pink, red, or brown. Document Released: 01/22/2000 Document Revised: 04/18/2011 Document Reviewed: 09/10/2007 [facility] Patient Information 2014 [place], [place].  _______________________________________________________________________

## 2016-01-25 ENCOUNTER — Encounter (HOSPITAL_COMMUNITY): Payer: Self-pay

## 2016-01-25 ENCOUNTER — Encounter (HOSPITAL_COMMUNITY)
Admission: RE | Admit: 2016-01-25 | Discharge: 2016-01-25 | Disposition: A | Discharge status: Home / Self Care | Payer: Managed Care, Other (non HMO) | Source: Ambulatory Visit | Attending: Specialist | Admitting: Specialist

## 2016-01-25 ENCOUNTER — Ambulatory Visit (HOSPITAL_COMMUNITY)
Admission: RE | Admit: 2016-01-25 | Discharge: 2016-01-25 | Disposition: A | Discharge status: Home / Self Care | Payer: Managed Care, Other (non HMO) | Source: Ambulatory Visit | Attending: Orthopedic Surgery | Admitting: Orthopedic Surgery

## 2016-01-25 ENCOUNTER — Other Ambulatory Visit: Payer: Self-pay

## 2016-01-25 DIAGNOSIS — I10 Essential (primary) hypertension: Secondary | ICD-10-CM | POA: Diagnosis not present

## 2016-01-25 DIAGNOSIS — M48061 Spinal stenosis, lumbar region without neurogenic claudication: Secondary | ICD-10-CM | POA: Diagnosis not present

## 2016-01-25 DIAGNOSIS — M47896 Other spondylosis, lumbar region: Secondary | ICD-10-CM | POA: Insufficient documentation

## 2016-01-25 LAB — BASIC METABOLIC PANEL
Anion gap: 6 (ref 5–15)
BUN: 19 mg/dL (ref 6–20)
CALCIUM: 9.1 mg/dL (ref 8.9–10.3)
CO2: 24 mmol/L (ref 22–32)
CREATININE: 0.88 mg/dL (ref 0.61–1.24)
Chloride: 108 mmol/L (ref 101–111)
Glucose, Bld: 95 mg/dL (ref 65–99)
Potassium: 4.3 mmol/L (ref 3.5–5.1)
SODIUM: 138 mmol/L (ref 135–145)

## 2016-01-25 LAB — CBC
HCT: 40.9 % (ref 39.0–52.0)
Hemoglobin: 14.2 g/dL (ref 13.0–17.0)
MCH: 28.7 pg (ref 26.0–34.0)
MCHC: 34.7 g/dL (ref 30.0–36.0)
MCV: 82.8 fL (ref 78.0–100.0)
PLATELETS: 289 10*3/uL (ref 150–400)
RBC: 4.94 MIL/uL (ref 4.22–5.81)
RDW: 12.8 % (ref 11.5–15.5)
WBC: 7.6 10*3/uL (ref 4.0–10.5)

## 2016-01-25 LAB — SURGICAL PCR SCREEN
MRSA, PCR: NEGATIVE
Staphylococcus aureus: NEGATIVE

## 2016-01-25 LAB — ABO/RH: ABO/RH(D): O POS

## 2016-01-25 NOTE — Progress Notes (Signed)
   01/25/16 0904  OBSTRUCTIVE SLEEP APNEA  Have you ever been diagnosed with sleep apnea through a sleep study? No  Do you snore loudly (loud enough to be heard through closed doors)?  0  Do you often feel tired, fatigued, or sleepy during the daytime (such as falling asleep during driving or talking to someone)? 0  Has anyone observed you stop breathing during your sleep? 0  Do you have, or are you being treated for high blood pressure? 1  BMI more than 35 kg/m2? 1  Age > 50 (1-yes) 1  Neck circumference greater than:Male 16 inches or larger, Male 17inches or larger? 1  Male Gender (Yes=1) 1  Obstructive Sleep Apnea Score 5  Score 5 or greater  Results sent to PCP

## 2016-02-03 MED ORDER — CEFAZOLIN SODIUM 10 G IJ SOLR
3.0000 g | INTRAMUSCULAR | Status: AC
  Administered 2016-02-04: 3 g via INTRAVENOUS
  Filled 2016-02-03: qty 3000

## 2016-02-04 ENCOUNTER — Ambulatory Visit (HOSPITAL_COMMUNITY)
Admission: RE | Admit: 2016-02-04 | Discharge: 2016-02-05 | Disposition: A | Discharge status: Home / Self Care | Payer: Managed Care, Other (non HMO) | Source: Ambulatory Visit | Attending: Specialist | Admitting: Specialist

## 2016-02-04 ENCOUNTER — Ambulatory Visit (HOSPITAL_COMMUNITY): Payer: Managed Care, Other (non HMO) | Admitting: Anesthesiology

## 2016-02-04 ENCOUNTER — Ambulatory Visit (HOSPITAL_COMMUNITY): Payer: Managed Care, Other (non HMO)

## 2016-02-04 ENCOUNTER — Encounter (HOSPITAL_COMMUNITY)
Admission: RE | Disposition: A | Discharge status: Home / Self Care | Payer: Self-pay | Source: Ambulatory Visit | Attending: Specialist

## 2016-02-04 ENCOUNTER — Encounter (HOSPITAL_COMMUNITY): Payer: Self-pay | Admitting: *Deleted

## 2016-02-04 DIAGNOSIS — Z87442 Personal history of urinary calculi: Secondary | ICD-10-CM | POA: Diagnosis not present

## 2016-02-04 DIAGNOSIS — M4807 Spinal stenosis, lumbosacral region: Secondary | ICD-10-CM | POA: Insufficient documentation

## 2016-02-04 DIAGNOSIS — M48061 Spinal stenosis, lumbar region without neurogenic claudication: Secondary | ICD-10-CM | POA: Diagnosis not present

## 2016-02-04 DIAGNOSIS — Z419 Encounter for procedure for purposes other than remedying health state, unspecified: Secondary | ICD-10-CM

## 2016-02-04 DIAGNOSIS — M5416 Radiculopathy, lumbar region: Secondary | ICD-10-CM | POA: Diagnosis not present

## 2016-02-04 DIAGNOSIS — I10 Essential (primary) hypertension: Secondary | ICD-10-CM | POA: Diagnosis not present

## 2016-02-04 DIAGNOSIS — M2578 Osteophyte, vertebrae: Secondary | ICD-10-CM | POA: Insufficient documentation

## 2016-02-04 HISTORY — PX: LUMBAR LAMINECTOMY/DECOMPRESSION MICRODISCECTOMY: SHX5026

## 2016-02-04 LAB — TYPE AND SCREEN
ABO/RH(D): O POS
Antibody Screen: NEGATIVE

## 2016-02-04 SURGERY — LUMBAR LAMINECTOMY/DECOMPRESSION MICRODISCECTOMY 2 LEVELS
Anesthesia: General | Site: Back | Laterality: Left

## 2016-02-04 MED ORDER — HYDROCODONE-ACETAMINOPHEN 5-325 MG PO TABS
1.0000 | ORAL_TABLET | ORAL | Status: DC | PRN
  Administered 2016-02-04 – 2016-02-05 (×3): 2 via ORAL
  Filled 2016-02-04 (×4): qty 2

## 2016-02-04 MED ORDER — MIDAZOLAM HCL 2 MG/2ML IJ SOLN
INTRAMUSCULAR | Status: AC
  Filled 2016-02-04: qty 2

## 2016-02-04 MED ORDER — OXYCODONE-ACETAMINOPHEN 10-325 MG PO TABS: 1.0000 | ORAL_TABLET | ORAL | 0 refills | Status: DC | PRN

## 2016-02-04 MED ORDER — DEXAMETHASONE SODIUM PHOSPHATE 10 MG/ML IJ SOLN
INTRAMUSCULAR | Status: DC | PRN
  Administered 2016-02-04: 10 mg via INTRAVENOUS

## 2016-02-04 MED ORDER — SUGAMMADEX SODIUM 500 MG/5ML IV SOLN
INTRAVENOUS | Status: DC | PRN
  Administered 2016-02-04: 500 mg via INTRAVENOUS

## 2016-02-04 MED ORDER — PHENOL 1.4 % MT LIQD
1.0000 | OROMUCOSAL | Status: DC | PRN
Start: 2016-02-04 — End: 2016-02-05

## 2016-02-04 MED ORDER — HYDROMORPHONE HCL 1 MG/ML IJ SOLN
0.2500 mg | INTRAMUSCULAR | Status: DC | PRN
  Administered 2016-02-04 (×3): 0.5 mg via INTRAVENOUS

## 2016-02-04 MED ORDER — OXYCODONE-ACETAMINOPHEN 5-325 MG PO TABS
1.0000 | ORAL_TABLET | ORAL | Status: DC | PRN
  Administered 2016-02-05 (×2): 2 via ORAL
  Filled 2016-02-04 (×2): qty 2

## 2016-02-04 MED ORDER — ROCURONIUM BROMIDE 10 MG/ML (PF) SYRINGE
PREFILLED_SYRINGE | INTRAVENOUS | Status: DC | PRN
  Administered 2016-02-04 (×4): 10 mg via INTRAVENOUS
  Administered 2016-02-04: 50 mg via INTRAVENOUS

## 2016-02-04 MED ORDER — ROCURONIUM BROMIDE 50 MG/5ML IV SOSY
PREFILLED_SYRINGE | INTRAVENOUS | Status: AC
  Filled 2016-02-04: qty 5

## 2016-02-04 MED ORDER — METHOCARBAMOL 1000 MG/10ML IJ SOLN
500.0000 mg | Freq: Four times a day (QID) | INTRAVENOUS | Status: DC | PRN
  Administered 2016-02-04: 500 mg via INTRAVENOUS
  Filled 2016-02-04: qty 550
  Filled 2016-02-04: qty 5

## 2016-02-04 MED ORDER — BUPIVACAINE HCL (PF) 0.5 % IJ SOLN
INTRAMUSCULAR | Status: AC
  Filled 2016-02-04: qty 30

## 2016-02-04 MED ORDER — THROMBIN 5000 UNITS EX SOLR
CUTANEOUS | Status: DC | PRN
  Administered 2016-02-04: 10000 [IU] via TOPICAL

## 2016-02-04 MED ORDER — MENTHOL 3 MG MT LOZG
1.0000 | LOZENGE | OROMUCOSAL | Status: DC | PRN
Start: 2016-02-04 — End: 2016-02-05

## 2016-02-04 MED ORDER — FENTANYL CITRATE (PF) 100 MCG/2ML IJ SOLN
INTRAMUSCULAR | Status: AC
  Filled 2016-02-04: qty 2

## 2016-02-04 MED ORDER — FENTANYL CITRATE (PF) 100 MCG/2ML IJ SOLN
INTRAMUSCULAR | Status: AC
  Filled 2016-02-04: qty 4

## 2016-02-04 MED ORDER — MAGNESIUM CITRATE PO SOLN: 1.0000 | Freq: Once | ORAL | Status: DC | PRN

## 2016-02-04 MED ORDER — HYDROMORPHONE HCL 1 MG/ML IJ SOLN
INTRAMUSCULAR | Status: AC
  Filled 2016-02-04: qty 1

## 2016-02-04 MED ORDER — LIDOCAINE-EPINEPHRINE (PF) 1 %-1:200000 IJ SOLN
INTRAMUSCULAR | Status: AC
Start: 2016-02-04 — End: 2016-02-04
  Filled 2016-02-04: qty 30

## 2016-02-04 MED ORDER — LIDOCAINE 2% (20 MG/ML) 5 ML SYRINGE
INTRAMUSCULAR | Status: DC | PRN
  Administered 2016-02-04: 50 mg via INTRAVENOUS

## 2016-02-04 MED ORDER — METHOCARBAMOL 500 MG PO TABS: 500.0000 mg | ORAL_TABLET | Freq: Four times a day (QID) | ORAL | 1 refills | Status: DC | PRN

## 2016-02-04 MED ORDER — POLYETHYLENE GLYCOL 3350 17 G PO PACK: 17.0000 g | PACK | Freq: Every day | ORAL | Status: DC | PRN

## 2016-02-04 MED ORDER — ACETAMINOPHEN 650 MG RE SUPP: 650.0000 mg | RECTAL | Status: DC | PRN

## 2016-02-04 MED ORDER — LOSARTAN POTASSIUM 50 MG PO TABS: 100.0000 mg | ORAL_TABLET | Freq: Every day | ORAL | Status: DC

## 2016-02-04 MED ORDER — HYDROMORPHONE HCL 1 MG/ML IJ SOLN
0.5000 mg | INTRAMUSCULAR | Status: DC | PRN
  Administered 2016-02-04 (×2): 1 mg via INTRAVENOUS
  Filled 2016-02-04 (×2): qty 1

## 2016-02-04 MED ORDER — EPHEDRINE 5 MG/ML INJ
INTRAVENOUS | Status: AC
  Filled 2016-02-04: qty 10

## 2016-02-04 MED ORDER — MIDAZOLAM HCL 5 MG/5ML IJ SOLN
INTRAMUSCULAR | Status: DC | PRN
  Administered 2016-02-04: 2 mg via INTRAVENOUS

## 2016-02-04 MED ORDER — KCL IN DEXTROSE-NACL 20-5-0.45 MEQ/L-%-% IV SOLN
INTRAVENOUS | Status: DC
  Administered 2016-02-04: 16:00:00 via INTRAVENOUS
  Filled 2016-02-04 (×2): qty 1000

## 2016-02-04 MED ORDER — ONDANSETRON HCL 4 MG/2ML IJ SOLN
INTRAMUSCULAR | Status: DC | PRN
Start: 2016-02-04 — End: 2016-02-04
  Administered 2016-02-04: 4 mg via INTRAVENOUS

## 2016-02-04 MED ORDER — LIDOCAINE-EPINEPHRINE (PF) 2 %-1:200000 IJ SOLN
INTRAMUSCULAR | Status: DC | PRN
  Administered 2016-02-04: 15 mL

## 2016-02-04 MED ORDER — THROMBIN 5000 UNITS EX SOLR
CUTANEOUS | Status: AC
  Filled 2016-02-04: qty 10000

## 2016-02-04 MED ORDER — POLYMYXIN B SULFATE 500000 UNITS IJ SOLR
INTRAMUSCULAR | Status: AC
  Filled 2016-02-04: qty 500000

## 2016-02-04 MED ORDER — DOCUSATE SODIUM 100 MG PO CAPS
100.0000 mg | ORAL_CAPSULE | Freq: Two times a day (BID) | ORAL | Status: DC
  Administered 2016-02-04 – 2016-02-05 (×2): 100 mg via ORAL
  Filled 2016-02-04 (×2): qty 1

## 2016-02-04 MED ORDER — EPHEDRINE SULFATE-NACL 50-0.9 MG/10ML-% IV SOSY
PREFILLED_SYRINGE | INTRAVENOUS | Status: DC | PRN
  Administered 2016-02-04 (×3): 5 mg via INTRAVENOUS
  Administered 2016-02-04: 10 mg via INTRAVENOUS
  Administered 2016-02-04 (×2): 5 mg via INTRAVENOUS

## 2016-02-04 MED ORDER — ONDANSETRON HCL 4 MG/2ML IJ SOLN
INTRAMUSCULAR | Status: AC
  Filled 2016-02-04: qty 2

## 2016-02-04 MED ORDER — ONDANSETRON HCL 4 MG/2ML IJ SOLN: 4.0000 mg | INTRAMUSCULAR | Status: DC | PRN

## 2016-02-04 MED ORDER — METHOCARBAMOL 500 MG PO TABS
500.0000 mg | ORAL_TABLET | Freq: Four times a day (QID) | ORAL | Status: DC | PRN
  Administered 2016-02-04 – 2016-02-05 (×2): 500 mg via ORAL
  Filled 2016-02-04 (×3): qty 1

## 2016-02-04 MED ORDER — ALUM & MAG HYDROXIDE-SIMETH 200-200-20 MG/5ML PO SUSP: 30.0000 mL | Freq: Four times a day (QID) | ORAL | Status: DC | PRN

## 2016-02-04 MED ORDER — POLYETHYLENE GLYCOL 3350 17 G PO PACK: 17.0000 g | PACK | Freq: Every day | ORAL | 0 refills | Status: DC

## 2016-02-04 MED ORDER — DEXAMETHASONE SODIUM PHOSPHATE 10 MG/ML IJ SOLN
INTRAMUSCULAR | Status: AC
  Filled 2016-02-04: qty 1

## 2016-02-04 MED ORDER — CEFAZOLIN SODIUM-DEXTROSE 2-4 GM/100ML-% IV SOLN
2.0000 g | Freq: Three times a day (TID) | INTRAVENOUS | Status: AC
  Administered 2016-02-04 – 2016-02-05 (×2): 2 g via INTRAVENOUS
  Filled 2016-02-04 (×2): qty 100

## 2016-02-04 MED ORDER — FENTANYL CITRATE (PF) 100 MCG/2ML IJ SOLN
INTRAMUSCULAR | Status: DC | PRN
  Administered 2016-02-04 (×6): 50 ug via INTRAVENOUS

## 2016-02-04 MED ORDER — RISAQUAD PO CAPS
1.0000 | ORAL_CAPSULE | Freq: Every day | ORAL | Status: DC
  Administered 2016-02-04 – 2016-02-05 (×2): 1 via ORAL
  Filled 2016-02-04 (×2): qty 1

## 2016-02-04 MED ORDER — POLYMYXIN B SULFATE 500000 UNITS IJ SOLR
INTRAMUSCULAR | Status: DC | PRN
  Administered 2016-02-04: 500 mL

## 2016-02-04 MED ORDER — BISACODYL 5 MG PO TBEC: 5.0000 mg | DELAYED_RELEASE_TABLET | Freq: Every day | ORAL | Status: DC | PRN

## 2016-02-04 MED ORDER — SUGAMMADEX SODIUM 500 MG/5ML IV SOLN
INTRAVENOUS | Status: AC
  Filled 2016-02-04: qty 5

## 2016-02-04 MED ORDER — DOCUSATE SODIUM 100 MG PO CAPS: 100.0000 mg | ORAL_CAPSULE | Freq: Two times a day (BID) | ORAL | 1 refills | Status: DC | PRN

## 2016-02-04 MED ORDER — ACETAMINOPHEN 325 MG PO TABS: 650.0000 mg | ORAL_TABLET | ORAL | Status: DC | PRN

## 2016-02-04 MED ORDER — PROPOFOL 10 MG/ML IV BOLUS
INTRAVENOUS | Status: DC | PRN
  Administered 2016-02-04: 200 mg via INTRAVENOUS

## 2016-02-04 MED ORDER — LACTATED RINGERS IV SOLN
INTRAVENOUS | Status: DC
  Administered 2016-02-04 (×2): via INTRAVENOUS
  Administered 2016-02-04: 1000 mL via INTRAVENOUS

## 2016-02-04 SURGICAL SUPPLY — 50 items
BAG ZIPLOCK 12X15 (MISCELLANEOUS) IMPLANT
CLEANER TIP ELECTROSURG 2X2 (MISCELLANEOUS) ×3 IMPLANT
CLOSURE WOUND 1/2 X4 (GAUZE/BANDAGES/DRESSINGS) ×1
CLOTH 2% CHLOROHEXIDINE 3PK (PERSONAL CARE ITEMS) ×3 IMPLANT
DRAPE MICROSCOPE LEICA (MISCELLANEOUS) ×3 IMPLANT
DRAPE SHEET LG 3/4 BI-LAMINATE (DRAPES) ×3 IMPLANT
DRAPE SURG 17X11 SM STRL (DRAPES) ×3 IMPLANT
DRAPE UTILITY XL STRL (DRAPES) ×3 IMPLANT
DRSG AQUACEL AG ADV 3.5X 6 (GAUZE/BANDAGES/DRESSINGS) ×3 IMPLANT
DURAPREP 26ML APPLICATOR (WOUND CARE) ×3 IMPLANT
DURASEAL SPINE SEALANT 3ML (MISCELLANEOUS) IMPLANT
ELECT BLADE TIP CTD 4 INCH (ELECTRODE) ×3 IMPLANT
ELECT REM PT RETURN 9FT ADLT (ELECTROSURGICAL) ×3
ELECTRODE REM PT RTRN 9FT ADLT (ELECTROSURGICAL) ×1 IMPLANT
GLOVE BIOGEL PI IND STRL 7.0 (GLOVE) ×1 IMPLANT
GLOVE BIOGEL PI IND STRL 7.5 (GLOVE) ×2 IMPLANT
GLOVE BIOGEL PI INDICATOR 7.0 (GLOVE) ×2
GLOVE BIOGEL PI INDICATOR 7.5 (GLOVE) ×4
GLOVE SURG SS PI 7.0 STRL IVOR (GLOVE) ×3 IMPLANT
GLOVE SURG SS PI 8.0 STRL IVOR (GLOVE) ×6 IMPLANT
GOWN STRL REUS W/TWL XL LVL3 (GOWN DISPOSABLE) ×9 IMPLANT
HEMOSTAT SPONGE AVITENE ULTRA (HEMOSTASIS) IMPLANT
IV CATH 14GX2 1/4 (CATHETERS) ×3 IMPLANT
KIT BASIN OR (CUSTOM PROCEDURE TRAY) ×3 IMPLANT
KIT POSITIONING SURG ANDREWS (MISCELLANEOUS) ×3 IMPLANT
MANIFOLD NEPTUNE II (INSTRUMENTS) ×3 IMPLANT
MARKER SKIN DUAL TIP RULER LAB (MISCELLANEOUS) ×3 IMPLANT
NEEDLE SPNL 18GX3.5 QUINCKE PK (NEEDLE) ×6 IMPLANT
PACK LAMINECTOMY ORTHO (CUSTOM PROCEDURE TRAY) ×3 IMPLANT
PATTIES SURGICAL .5 X.5 (GAUZE/BANDAGES/DRESSINGS) ×3 IMPLANT
PATTIES SURGICAL .75X.75 (GAUZE/BANDAGES/DRESSINGS) ×3 IMPLANT
PATTIES SURGICAL 1X1 (DISPOSABLE) IMPLANT
RUBBERBAND STERILE (MISCELLANEOUS) ×3 IMPLANT
SPONGE LAP 4X18 X RAY DECT (DISPOSABLE) IMPLANT
SPONGE SURGIFOAM ABS GEL 100 (HEMOSTASIS) ×3 IMPLANT
STAPLER VISISTAT (STAPLE) ×3 IMPLANT
STRIP CLOSURE SKIN 1/2X4 (GAUZE/BANDAGES/DRESSINGS) ×2 IMPLANT
SUT NURALON 4 0 TR CR/8 (SUTURE) IMPLANT
SUT PROLENE 3 0 PS 2 (SUTURE) IMPLANT
SUT VIC AB 1 CT1 27 (SUTURE)
SUT VIC AB 1 CT1 27XBRD ANTBC (SUTURE) IMPLANT
SUT VIC AB 1-0 CT2 27 (SUTURE) ×3 IMPLANT
SUT VIC AB 2-0 CT1 27 (SUTURE)
SUT VIC AB 2-0 CT1 TAPERPNT 27 (SUTURE) IMPLANT
SUT VIC AB 2-0 CT2 27 (SUTURE) ×6 IMPLANT
SYR 3ML LL SCALE MARK (SYRINGE) IMPLANT
TOWEL OR 17X26 10 PK STRL BLUE (TOWEL DISPOSABLE) ×3 IMPLANT
TOWEL OR NON WOVEN STRL DISP B (DISPOSABLE) ×3 IMPLANT
TRAY FOLEY W/METER SILVER 16FR (SET/KITS/TRAYS/PACK) ×3 IMPLANT
YANKAUER SUCT BULB TIP NO VENT (SUCTIONS) ×3 IMPLANT

## 2016-02-04 NOTE — Anesthesia Procedure Notes (Signed)
Procedure Name: Intubation Date/Time: 02/04/2016 10:04 AM Performed by: Mirren Gest, Nuala AlphaKRISTOPHER Pre-anesthesia Checklist: Patient identified, Emergency Drugs available, Suction available, Patient being monitored and Timeout performed Patient Re-evaluated:Patient Re-evaluated prior to inductionOxygen Delivery Method: Circle system utilized Preoxygenation: Pre-oxygenation with 100% oxygen Intubation Type: IV induction Ventilation: Mask ventilation without difficulty, Two handed mask ventilation required and Oral airway inserted - appropriate to patient size Laryngoscope Size: Mac and 4 Grade View: Grade I Tube type: Oral Tube size: 7.5 mm Number of attempts: 1 Airway Equipment and Method: Stylet Placement Confirmation: ETT inserted through vocal cords under direct vision,  positive ETCO2,  CO2 detector and breath sounds checked- equal and bilateral Secured at: 23 cm Tube secured with: Tape Dental Injury: Teeth and Oropharynx as per pre-operative assessment

## 2016-02-04 NOTE — Transfer of Care (Signed)
Immediate Anesthesia Transfer of Care Note  Patient: Parker Saunders  Procedure(s) Performed: Procedure(s): REDO MICRO LUMBAR DECOMPRESSION L4-5, L5-S1 ON THE LEFT  2 LEVELS (Left)  Patient Location: PACU  Anesthesia Type:General  Level of Consciousness:  sedated, patient cooperative and responds to stimulation  Airway & Oxygen Therapy:Patient Spontanous Breathing and Patient connected to face mask oxgen  Post-op Assessment:  Report given to PACU RN and Post -op Vital signs reviewed and stable  Post vital signs:  Reviewed and stable  Last Vitals:  Vitals:   02/04/16 0742  BP: 139/84  Pulse: 80  Resp: 18  Temp: 36.4 C    Complications: No apparent anesthesia complications

## 2016-02-04 NOTE — Progress Notes (Signed)
PT Cancellation Note  Patient Details Name: Genoveva IllSamuel Burlingame MRN: [id number] DOB: 01-20-1966   Cancelled Treatment:    Reason Eval/Treat Not Completed: Medical issues which prohibited therapy (woozy and in Pain)   Rada HayHill, Jaiveer Panas Elizabeth 02/04/2016, 4:41 PM

## 2016-02-04 NOTE — Anesthesia Postprocedure Evaluation (Signed)
Anesthesia Post Note  Patient: Parker IllSamuel Staszewski  Procedure(s) Performed: Procedure(s) (LRB): REDO MICRO LUMBAR DECOMPRESSION L4-5, L5-S1 ON THE LEFT  2 LEVELS (Left)  Patient location during evaluation: PACU Anesthesia Type: General Level of consciousness: sedated Pain management: satisfactory to patient Vital Signs Assessment: post-procedure vital signs reviewed and stable Respiratory status: spontaneous breathing Cardiovascular status: stable Anesthetic complications: no       Last Vitals:  Vitals:   02/04/16 1555 02/04/16 1752  BP: 139/86 (!) 157/87  Pulse: 90 94  Resp: 16 15  Temp: 36.3 C 36.4 C    Last Pain:  Vitals:   02/04/16 1752  TempSrc: Oral  PainSc:                  Jiles GarterJACKSON,[name]

## 2016-02-04 NOTE — Anesthesia Preprocedure Evaluation (Signed)
Anesthesia Evaluation  Patient identified by MRN, date of birth, ID band Patient awake    Reviewed: Allergy & Precautions, H&P , Patient's Chart, lab work & pertinent test results, reviewed documented beta blocker date and time   Airway Mallampati: II  TM Distance: >3 FB Neck ROM: full    Dental no notable dental hx.    Pulmonary    Pulmonary exam normal breath sounds clear to auscultation       Cardiovascular hypertension, On Medications  Rhythm:regular Rate:Normal     Neuro/Psych    GI/Hepatic   Endo/Other    Renal/GU      Musculoskeletal   Abdominal   Peds  Hematology   Anesthesia Other Findings HTN; NSR  Reproductive/Obstetrics                             Anesthesia Physical Anesthesia Plan  ASA: II  Anesthesia Plan: General   Post-op Pain Management:    Induction: Intravenous  Airway Management Planned: Oral ETT  Additional Equipment:   Intra-op Plan:   Post-operative Plan: Extubation in OR  Informed Consent: I have reviewed the patients History and Physical, chart, labs and discussed the procedure including the risks, benefits and alternatives for the proposed anesthesia with the patient or authorized representative who has indicated his/her understanding and acceptance.   Dental Advisory Given and Dental advisory given  Plan Discussed with: CRNA and Surgeon  Anesthesia Plan Comments: (  Discussed general anesthesia, including possible nausea, instrumentation of airway, sore throat,pulmonary aspiration, etc. I asked if the were any outstanding questions, or  concerns before we proceeded.)        Anesthesia Quick Evaluation

## 2016-02-04 NOTE — H&P (View-Only) (Signed)
Parker Saunders is an 50 y.o. male.   Chief Complaint: back and leg pain HPI: The patient is a 50 year old male who presents today for follow up of their back. The patient is being followed for their back and neck pain. They are now year(s) out from when symptoms began. Symptoms reported today include: pain. Current treatment includes: activity modification, NSAIDs (Ibuprofen) and Gabapentin. The patient reports their current pain level to be 10 / 10. The patient presents today following MRI.  He follows up. He has had an MRI of his cervical and lumbar spine.  He reports a 10/10 pain in the left leg, radiates down in the bottom of his foot. He does have neck and intermittent upper extremity pain. He is here with his wife.  Past Medical History:  Diagnosis Date  . Hypertension   . Renal stone   . Rotator cuff disorder     Past Surgical History:  Procedure Laterality Date  . BACK SURGERY  2005  . SHOULDER SURGERY Right 2014  . SPINE SURGERY  02/07/2005   [facility]    Family History  Problem Relation Age of Onset  . Hyperlipidemia Mother   . Hypertension Mother   . COPD Father   . Hyperlipidemia Father   . Hypertension Father    Social History:  reports that he has never smoked. He does not have any smokeless tobacco history on file. He reports that he does not drink alcohol or use drugs.  Allergies: No Known Allergies   (Not in a hospital admission)  No results found for this or any previous visit (from the past 48 hour(s)). No results found.  Review of Systems  Constitutional: Negative.   HENT: Negative.   Eyes: Negative.   Respiratory: Negative.   Cardiovascular: Negative.   Gastrointestinal: Negative.   Genitourinary: Negative.   Musculoskeletal: Positive for back pain.  Skin: Negative.   Neurological: Positive for sensory change and focal weakness.    There were no vitals taken for this visit. Physical Exam  Constitutional: He is oriented to person,  place, and time. He appears well-developed.  HENT:  Head: Normocephalic.  Eyes: Pupils are equal, round, and reactive to light.  Neck: Normal range of motion.  Cardiovascular: Normal rate.   Respiratory: Effort normal.  GI: Soft.  Musculoskeletal:  Moderate distress. Walks with an antalgic gait. Mood and affect is appropriate. Straight leg raising, left buttock, thigh and calf pain; negative on the right. EHL is 4/5 on left compared to the right. Altered sensation in L5 dermatome.  Lumbar spine exam reveals no evidence of soft tissue swelling, deformity or skin ecchymosis. On palpation there is no tenderness of the lumbar spine. No flank pain with percussion. The abdomen is soft and nontender. Nontender over the trochanters. No cellulitis or lymphadenopathy.  Good range of motion of the lumbar spine without associated pain. Motor is 5/5 including tibialis anterior, plantar flexion, quadriceps and hamstrings. Patient is normoreflexic. There is no Babinski or clonus. Sensory exam is intact to light touch. Patient has good distal pulses. No DVT. No pain and normal range of motion without instability of the hips, knees and ankles.  Cervical spine: Pain with end extension and flexion of the cervical spine. Inspection of the cervical spine reveals a normal lordosis without evidence of paraspinous spasms or soft tissue swelling. Nontender to palpation. Full flexion, full extension, full left and right lateral rotation. Negative impingement sign, negative secondary impingement sign of the shoulders. Negative Tinel's median  and ulnar nerves at the elbow. Negative carpal compression test at the wrist. Motor of the upper extremities is 5/5 including biceps, triceps, brachioradialis, wrist flexion, wrist extension, finger flexion, finger extension. Reflexes are normoreflexic. Sensory exam is intact to light touch. There is no Hoffmann sign. Nontender over the thoracic spine.  Neurological: He is alert and  oriented to person, place, and time.  Skin: Skin is warm and dry.  Psychiatric: He has a normal mood and affect.    MRI demonstrate severe lateral recess stenosis on the left, compression of the 5 rib. He has moderate to severe foraminal stenosis L5-S1 on the left.  Cervical spine demonstrates cervical spondylosis multilevel with neural foraminal stenosis at multilevel, C5-6, C6-7.  Assessment/Plan 1. Refractory L5 radiculopathy secondary to lateral recess stenosis at 4-5, 5-1. Myotomal weakness, dermatomal dysesthesias. 2. Axial cervical pain. Intermittent radicular pain secondary to cervical spondylosis 5-6, 6-7. No focal neurologic deficit.  1. Extensive discussion with Yoel concerning his current pathology, relevant anatomy, treatment options. He has reported persistent symptoms. Given the neural compression on the left, refractory to conservative treatment, discussed microlumbar decompression at 4-5, possible hemilaminectomy of L5 and foraminotomy L5 on the left. 2. Conservative with cervical spine, activity modification, strategies to avoid re-injury. Avoid extension, favor flexion. We discussed selective nerve blocks, etc. His main problem today is his leg and back. We will proceed with decompression. If in the interim he is to call. We discussed this. We discussed epidurals. He does not want to do that. He tried that previously and had no relief from that. Given the neural compression and the neurologic deficit, I do not feel that would be appropriate and would only add to the delay and the increased risk of a battered root syndrome.  Plan microlumbar decompression L4-5, L5-S1 left  Aleisha Paone M., PA-C for Dr. Beane 01/14/2016, 3:44 PM   

## 2016-02-04 NOTE — Op Note (Signed)
NAME:  Parker Saunders, Parker Saunders             ACCOUNT NO.:  [id number]  MEDICAL RECORD NO.:  [id number]  LOCATION:  [facility]                         [facility]  PHYSICIAN:  Jene EveryJeffrey Jordani Nunn, M.D.    DATE OF BIRTH:  1965/09/13  DATE OF PROCEDURE:  02/04/2016 DATE OF DISCHARGE:                              OPERATIVE REPORT   PREOPERATIVE DIAGNOSES:  Recurrent spinal stenosis at L4-5, spinal stenosis at L5-S1.  POSTOPERATIVE DIAGNOSES:  Recurrent spinal stenosis at L4-5, spinal stenosis at L5-S1.  PROCEDURE PERFORMED: 1. Revision microlumbar decompression 4-5 and 5-1, left. 2. Foraminotomies, L4, L5, S1, left. 3. Hemilaminectomy, L5, left.  ANESTHESIA:  General.  ASSISTANT:  Lanna PocheJacqueline Bissell, PA.  HISTORY:  A 50 year old, male, history of spinal stenosis, decompression in the past.  Had recurrent spinal stenosis, L5 radiculopathy, myotomal weakness, dermatomal dysesthesias secondary to disk osteophyte complex at 4-5 compressing the 5 root, also on the foramen at 5-1.  We discussed decompression of the 5 root by revision of 4-5 and 5-1.  Risks and benefits discussed including bleeding, infection, damage to the neurovascular structures, DVT, PE, anesthetic complications, no change in symptoms, worsening symptoms, etc.  He had minimal back pain and predominant leg pain.  TECHNIQUE:  With the patient in a supine position, after induction of adequate general anesthesia, 3 g Kefzol, placed prone on the ShokanAndrews frame.  All bony prominences were well padded.  Lumbar region was prepped and draped in usual sterile fashion.  Two 18-gauge spinal needles were utilized to localize 4-5 interspace, confirmed with x-ray and 5-1.  Incision was made from above the spinous process of 4 just to below 5.  Subcutaneous tissue was dissected.  Electrocautery was utilized to achieve hemostasis.  We encountered scar tissue from the previous surgery.  We skeletonized the previous laminotomy at 4-5.  We placed a  McCullough retractor.  Confirmatory radiograph obtained. Operating microscope was draped and brought on the surgical field.  We used a Leksell rongeur to perform partial hemilaminotomy of the caudad edge of 4 and of 5.  We then utilized micro curette to detach the ligamentum flavum from the caudad edge of 5 and cephalad edge of 5 mobilizing the epidural fibrosis and also from the caudad edge of 4.  We enlarged hemilaminotomy of the 2 mm Kerrison cephalad and caudad as well as the cephalad from the L5-S1 disk space.  We placed a neuro patty beneath the ligamentum flavum.  Severe lateral recess stenosis was noted at 4-5.  Due to facet hypertrophy, ligamentum flavum hypertrophy, disk osteophyte complex, performed a generous foraminotomy of 5, identified the 5 root, gently mobilized it medially protecting the thecal sac. Decompressed the lateral recess to the medial border of the pedicle.  We used an osteotome and a mallet to perform partial medial hemi- facetectomy due to the large facet.  After decompressing the lateral recess from the medial border of the pedicle, we performed a foraminotomy of L4 as well.  Neural probe passed freely up the foramen of 4.  There was no disk herniation or bulging disk was noted.  The probe passed freely above the pedicle of 4 on the left.  The lamina of 5 has a compressing anatomy  to the 5 root will continue, therefore we performed a hemilaminectomy of 5 and a generous foraminotomy of 5 and we evaluated the disk at 5 with bulging degenerated disk, no herniated disk.  We performed a foraminotomy of 5.  Following that, a neuro probe passed freely at the foramen of 4 and 5 and S1.  No CSF leakage or active bleeding.  Confirmatory radiograph obtained with a marker at the foramen of 4 and at S1.  I felt this adequately addressed the pathology that was noted.  Copiously irrigated with antibiotic irrigation.  We used bipolar electrocautery to achieve hemostasis.   Thrombin-soaked Gelfoam was placed in laminotomy defect.  We removed the [facility] ret[facility].  Paraspinous muscles inspected.  No active bleeding.  Closed the dorsolumbar fascia with 1-Vicryl, subcu with 2-0, and skin with staples.  Wound was dressed sterilely.  Placed supine on the hospital bed, extubated without difficulty, and transported to the recovery room in a satisfactory condition.  The patient tolerated the procedure well.  No complications.  Minimal blood loss.  Lanna PocheJacqueline Bissell, PA, assistant for patient positioning, closure, gentle intermittent neural traction, and suction.     Jene EveryJeffrey Nazariah Cadet, M.D.     Cordelia PenJB/MEDQ  D:  02/04/2016  T:  02/04/2016  Job:  [id number]

## 2016-02-04 NOTE — Discharge Instructions (Signed)

## 2016-02-04 NOTE — Interval H&P Note (Signed)
History and Physical Interval Note:  02/04/2016 9:44 AM  Parker IllSamuel Holton  has presented today for surgery, with the diagnosis of Stenosis L4-5, L5-S1  The various methods of treatment have been discussed with the patient and family. After consideration of risks, benefits and other options for treatment, the patient has consented to  Procedure(s): MICRO LUMBAR DECOMPRESSION L4-5, L5-S1 ON THE LEFT  2 LEVELS (Left) as a surgical intervention .  The patient's history has been reviewed, patient examined, no change in status, stable for surgery.  I have reviewed the patient's chart and labs.  Questions were answered to the patient's satisfaction.     Nitish Roes C

## 2016-02-04 NOTE — Brief Op Note (Signed)
02/04/2016  12:08 PM  PATIENT:  Parker Saunders  50 y.o. male  PRE-OPERATIVE DIAGNOSIS:  Stenosis L4-5, L5-S1  POST-OPERATIVE DIAGNOSIS:  Stenosis L4-5, L5-S1  PROCEDURE:  Procedure(s): REDO MICRO LUMBAR DECOMPRESSION L4-5, L5-S1 ON THE LEFT  2 LEVELS (Left)  SURGEON:  Surgeon(s) and Role:    * Jene EveryJeffrey Mccrae Speciale, MD - Primary  PHYSICIAN ASSISTANT:   ASSISTANTS: Bissell   ANESTHESIA:   general  EBL:  Total I/O In: 1300 [I.V.:1300] Out: 225 [Urine:175; Blood:50]  BLOOD ADMINISTERED:none  DRAINS: none   LOCAL MEDICATIONS USED:  MARCAINE     SPECIMEN:  No Specimen  DISPOSITION OF SPECIMEN:  N/A  COUNTS:  YES  TOURNIQUET:  * No tourniquets in log *  DICTATION: .Other Dictation: Dictation Number [id number]  PLAN OF CARE: Admit for overnight observation  PATIENT DISPOSITION:  PACU - hemodynamically stable.   Delay start of Pharmacological VTE agent (>24hrs) due to surgical blood loss or risk of bleeding: yes

## 2016-02-05 DIAGNOSIS — M4807 Spinal stenosis, lumbosacral region: Secondary | ICD-10-CM | POA: Diagnosis not present

## 2016-02-05 MED ORDER — IBUPROFEN 200 MG PO TABS: 800.0000 mg | ORAL_TABLET | Freq: Four times a day (QID) | ORAL | 0 refills | Status: DC | PRN

## 2016-02-05 NOTE — Evaluation (Signed)
Occupational Therapy Evaluation Patient Details Name: Parker IllSamuel Balch MRN: [id number] DOB: 1965/05/09 Today's Date: 02/05/2016    History of Present Illness Revision microlumbar decompression 4-5 and 5-1, left Foraminotomies, L4, L5, S1, left. Hemilaminectomy, L5, left   Clinical Impression   This 50 year old man was admitted for the above sx. All education was completed. No further OT is needed at this time    Follow Up Recommendations  No OT follow up    Equipment Recommendations  3 in 1 bedside commode    Recommendations for Other Services       Precautions / Restrictions Precautions Precautions: Back Restrictions Weight Bearing Restrictions: No      Mobility Bed Mobility               General bed mobility comments: supervision, cues for technique for OOB.  Pt states he usually sleeps in recliner  Transfers Overall transfer level: Needs assistance Equipment used: Rolling walker (2 wheeled) Transfers: Sit to/from Stand Sit to Stand: Min guard         General transfer comment: for safety; cues for back precautions    Balance                                            ADL Overall ADL's : Needs assistance/impaired     Grooming: Supervision/safety;Standing   Upper Body Bathing: Set up;Sitting   Lower Body Bathing: Minimal assistance;Sit to/from stand   Upper Body Dressing : Set up;Sitting   Lower Body Dressing: Maximal assistance;Sit to/from stand   Toilet Transfer: Min guard;Ambulation;RW   Toileting- Clothing Manipulation and Hygiene: Supervision/safety;Sit to/from stand         General ADL Comments: pt donned underwear:  He has a Sports administratorreacher at home, and family can assist.  Educated on long sponge for bathing and reaching what he can without bending and standing on both feet, then sitting on 3:1 commode for rest with long sponge.  Also educated on toilet aide, if needed.  Showed sidestepping over tub when ready.  Reviewed  all back precautions with adls and handout given.  Attempted to sit on high commode, but stopped as pt could not do this comfortably     Vision     Perception     Praxis      Pertinent Vitals/Pain Pain Assessment: 0-10 Pain Score: 7  Pain Location: back Pain Descriptors / Indicators: Aching Pain Intervention(s): Limited activity within patient's tolerance;Monitored during session;Premedicated before session;Repositioned     Hand Dominance     Extremity/Trunk Assessment Upper Extremity Assessment Upper Extremity Assessment: Overall WFL for tasks assessed           Communication Communication Communication: No difficulties   Cognition Arousal/Alertness: Awake/alert Behavior During Therapy: WFL for tasks assessed/performed Overall Cognitive Status: Within Functional Limits for tasks assessed                     General Comments       Exercises       Shoulder Instructions      Home Living Family/patient expects to be discharged to:: Private residence Living Arrangements: Spouse/significant other Available Help at Discharge: Family               Bathroom Shower/Tub: Tub/shower unit Shower/tub characteristics: Curtain FirefighterBathroom Toilet: Handicapped height         Additional Comments: has  a reacher at home, cane.  May be able to borrow RW      Prior Functioning/Environment Level of Independence: Independent                 OT Problem List:     OT Treatment/Interventions:      OT Goals(Current goals can be found in the care plan section) Acute Rehab OT Goals Patient Stated Goal: to do this right:  I don't want to have a 3rd sx OT Goal Formulation: All assessment and education complete, DC therapy  OT Frequency:     Barriers to D/C:            Co-evaluation              End of Session    Activity Tolerance: Patient tolerated treatment well Patient left:  (with PT)   Time: 1308-6578: 0759-0817 OT Time Calculation (min): 18  min Charges:  OT General Charges $OT Visit: 1 Procedure OT Evaluation $OT Eval Low Complexity: 1 Procedure G-Codes: OT G-codes **NOT FOR INPATIENT CLASS** Functional Assessment Tool Used: clinical observation and judgment Functional Limitation: Self care Self Care Current Status ([id number](G8987): At least 60 percent but less than 80 percent impaired, limited or restricted Self Care Goal Status ([id number](G8988): At least 60 percent but less than 80 percent impaired, limited or restricted Self Care Discharge Status ([phone]): At least 60 percent but less than 80 percent impaired, limited or restricted  [facility] LLC[facility]ER,Kymani Laursen 02/05/2016, 9:12 AM  Marica OtterMaryellen Asencion Loveday, OTR/L [phone] 02/05/2016

## 2016-02-05 NOTE — Progress Notes (Signed)
Subjective: 1 Day Post-Op Procedure(s) (LRB): REDO MICRO LUMBAR DECOMPRESSION L4-5, L5-S1 ON THE LEFT  2 LEVELS (Left) Patient reports pain as mild.  Reports incisional pain, no leg pain. Foley removed this morning has not voided yet. No other c/o. Feels able to D/C home today.  Objective: Vital signs in last 24 hours: Temp:  [97.4 F (36.3 C)-98 F (36.7 C)] 98 F (36.7 C) (12/29 0552) Pulse Rate:  [82-103] 85 (12/29 0552) Resp:  [8-16] 16 (12/29 0552) BP: (123-157)/(65-87) 125/65 (12/29 0552) SpO2:  [97 %-100 %] 98 % (12/29 0552)  Intake/Output from previous day: 12/28 0701 - 12/29 0700 In: 3174.2 [P.O.:720; I.V.:2199.2; IV Piggyback:255] Out: 2500 [Urine:2450; Blood:50] Intake/Output this shift: No intake/output data recorded.  No results for input(s): HGB in the last 72 hours. No results for input(s): WBC, RBC, HCT, PLT in the last 72 hours. No results for input(s): NA, K, CL, CO2, BUN, CREATININE, GLUCOSE, CALCIUM in the last 72 hours. No results for input(s): LABPT, INR in the last 72 hours.  Neurologically intact ABD soft Neurovascular intact Sensation intact distally Intact pulses distally Dorsiflexion/Plantar flexion intact Incision: dressing C/D/I and no drainage No cellulitis present Compartment soft no calf pain or sign of DVT  Assessment/Plan: 1 Day Post-Op Procedure(s) (LRB): REDO MICRO LUMBAR DECOMPRESSION L4-5, L5-S1 ON THE LEFT  2 LEVELS (Left) Advance diet Up with therapy D/C IV fluids  Discussed D/C instructions, dressing instructions, Lspine precautions Voiding trial, straight cath and bladder scan if no void in 6 h from foley removal As long as voiding without difficulty and pain remains well controlled plan for D/C later today  Parker Saunders M. 02/05/2016, 8:00 AM

## 2016-02-05 NOTE — Evaluation (Signed)
Physical Therapy Evaluation Patient Details Name: Parker IllSamuel Saunders MRN: [id number] DOB: 07/27/1965 Today's Date: 02/05/2016   History of Present Illness  Revision microlumbar decompression 4-5 and 5-1, left Foraminotomies, L4, L5, S1, left. Hemilaminectomy, L5, left  Clinical Impression  No follow up PT. Ambulating well.   Follow Up Recommendations No PT follow up    Equipment Recommendations  None recommended by PT    Recommendations for Other Services       Precautions / Restrictions Precautions Precautions: Back Precaution Booklet Issued: Yes (comment)      Mobility  Bed Mobility                  Transfers                 General transfer comment: up in room  Ambulation/Gait Ambulation/Gait assistance: Supervision Ambulation Distance (Feet): 300 Feet Assistive device: Rolling walker (2 wheeled) Gait Pattern/deviations: Step-through pattern        Stairs            Wheelchair Mobility    Modified Rankin (Stroke Patients Only)       Balance                                             Pertinent Vitals/Pain Pain Score: 5  Pain Location: back Pain Descriptors / Indicators: Sore Pain Intervention(s): Monitored during session;Premedicated before session    Home Living Family/patient expects to be discharged to:: Private residence Living Arrangements: Spouse/significant other Available Help at Discharge: Family   Home Access: Stairs to enter   Secretary/administratorntrance Stairs-Number of Steps: 1   Home Equipment: Environmental consultantWalker - 2 wheels Additional Comments: has a Sports administratorreacher at home, cane.  May be able to borrow RW    Prior Function                 Hand Dominance        Extremity/Trunk Assessment   Upper Extremity Assessment Upper Extremity Assessment: Defer to OT evaluation    Lower Extremity Assessment Lower Extremity Assessment: Overall WFL for tasks assessed       Communication      Cognition Arousal/Alertness:  Awake/alert Behavior During Therapy: WFL for tasks assessed/performed Overall Cognitive Status: Within Functional Limits for tasks assessed                      General Comments      Exercises     Assessment/Plan    PT Assessment Patent does not need any further PT services  PT Problem List            PT Treatment Interventions      PT Goals (Current goals can be found in the Care Plan section)  Acute Rehab PT Goals Patient Stated Goal: to do this right:  I don't want to have a 3rd sx PT Goal Formulation: All assessment and education complete, DC therapy    Frequency     Barriers to discharge        Co-evaluation               End of Session   Activity Tolerance: Patient tolerated treatment well Patient left: in chair;with call bell/phone within reach Nurse Communication: Mobility status    Functional Assessment Tool Used: clinical judgement Functional Limitation: Mobility: Walking and moving around Mobility: Walking and Moving Around Current  Status [phone]([id number]): At least 1 percent but less than 20 percent impaired, limited or restricted Mobility: Walking and Moving Around Goal Status [phone]([phone]): At least 1 percent but less than 20 percent impaired, limited or restricted Mobility: Walking and Moving Around Discharge Status [phone]([phone]): At least 1 percent but less than 20 percent impaired, limited or restricted    Time: 0814-0825 PT Time Calculation (min) (ACUTE ONLY): 11 min   Charges:   PT Evaluation $PT Eval Low Complexity: 1 Procedure     PT G Codes:   PT G-Codes **NOT FOR INPATIENT CLASS** Functional Assessment Tool Used: clinical judgement Functional Limitation: Mobility: Walking and moving around Mobility: Walking and Moving Around Current Status ([phone](G8978): At least 1 percent but less than 20 percent impaired, limited or restricted Mobility: Walking and Moving Around Goal Status [phone](G8979): At least 1 percent but less than 20 percent impaired, limited or  restricted Mobility: Walking and Moving Around Discharge Status [phone]([phone]): At least 1 percent but less than 20 percent impaired, limited or restricted    Rada HayHill, Shi Blankenship Elizabeth 02/05/2016, 1:45 PM

## 2016-02-05 NOTE — Op Note (Signed)
NAME:  Donalynn FurlongMCKENNEY, Valen             ACCOUNT NO.:  [id number]  MEDICAL RECORD NO.:  [id number]  LOCATION:  [facility]                         [facility]  PHYSICIAN:  Jene EveryJeffrey Starlena Beil, M.D.    DATE OF BIRTH:  [birth year]  DATE OF PROCEDURE:  02/04/2016 DATE OF DISCHARGE:                              OPERATIVE REPORT   PREOPERATIVE DIAGNOSES: 1. Recurrent spinal stenosis, L4-5. 2. Spinal stenosis, L5-S1.  POSTOPERATIVE DIAGNOSES: 1. Recurrent spinal stenosis, L4-5. 2. Spinal stenosis, L5-S1.  PROCEDURE PERFORMED: 1. Revision microlumbar decompression, L4-5 and L5-S1, left. 2. Hemilaminectomy, L5, left. 3. Foraminotomies at L4, L5, and S1, left.  ANESTHESIA:  General.  ASSISTANT:  Lanna PocheJacqueline Bissell, PA.  HISTORY:  A 50 year old male with history of lumbar decompression, L4-5 and L3-4 in the past, who had a recurrent stenosis particularly at L4-5 with L5 radiculopathy, myotomal weakness, dermatomal __________.  He had MRI indicating severe spinal stenosis in the lateral recess of L4-5, disc protrusion, osteophyte complex, neuroforaminal stenosis at L5 disc. He was indicated for microlumbar decompression.  DICTATION ENDED AT THIS POINT     Jene EveryJeffrey Shadara Lopez, M.D.     JB/MEDQ  D:  02/04/2016  T:  02/05/2016  Job:  [id number]

## 2016-02-05 NOTE — Care Management Note (Signed)
Case Management Note  Patient Details  Name: Parker Saunders MRN: [id number] Date of Birth: 08-24-1965  Subjective/Objective:  3n1 ordered, d/c today. [facility] dme rep Shannon-notified of 3n1 to deliver to rm prior d/c-Nsg notified. No further CM dc needs.                  Action/Plan:d/c home.   Expected Discharge Date:                 Expected Discharge Plan:  Home/Self Care  In-House Referral:     Discharge planning Services  CM Consult  Post Acute Care Choice:    Choice offered to:  Patient  DME Arranged:  3-N-1 DME Agency:  [facility]  HH Arranged:    HH Agency:     Status of Service:  Completed, signed off  If discussed at Long Length of Stay Meetings, dates discussed:    Additional Comments:  Lanier ClamMahabir, Hara Milholland, RN 02/05/2016, 9:00 AM

## 2016-02-05 NOTE — Discharge Summary (Signed)
Physician Discharge Summary   Patient ID: Parker Saunders MRN: [id number] DOB/AGE: April 23, 1965 50 y.o.  Admit date: 02/04/2016 Discharge date: 02/05/2016  Primary Diagnosis:   Stenosis L4-5, L5-S1  Admission Diagnoses:  Past Medical History:  Diagnosis Date  . Hypertension   . Renal stone   . Rotator cuff disorder    Discharge Diagnoses:   Principal Problem:   Spinal stenosis of lumbar region  Procedure:  Procedure(s) (LRB): REDO MICRO LUMBAR DECOMPRESSION L4-5, L5-S1 ON THE LEFT  2 LEVELS (Left)   Consults: None  HPI:  see H&P    Laboratory Data: Hospital Outpatient Visit on 01/25/2016  Component Date Value Ref Range Status  . MRSA, PCR 01/25/2016 NEGATIVE  NEGATIVE Final  . Staphylococcus aureus 01/25/2016 NEGATIVE  NEGATIVE Final   Comment:        The Xpert SA Assay (FDA approved for NASAL specimens in patients over 41 years of age), is one component of a comprehensive surveillance program.  Test performance has been validated by [facility] for patients greater than or equal to 53 year old. It is not intended to diagnose infection nor to guide or monitor treatment.   . Sodium 01/25/2016 138  135 - 145 mmol/L Final  . Potassium 01/25/2016 4.3  3.5 - 5.1 mmol/L Final  . Chloride 01/25/2016 108  101 - 111 mmol/L Final  . CO2 01/25/2016 24  22 - 32 mmol/L Final  . Glucose, Bld 01/25/2016 95  65 - 99 mg/dL Final  . BUN 01/25/2016 19  6 - 20 mg/dL Final  . Creatinine, Ser 01/25/2016 0.88  0.61 - 1.24 mg/dL Final  . Calcium 01/25/2016 9.1  8.9 - 10.3 mg/dL Final  . GFR calc non Af Amer 01/25/2016 >60  >60 mL/min Final  . GFR calc Af Amer 01/25/2016 >60  >60 mL/min Final   Comment: (NOTE) The eGFR has been calculated using the CKD EPI equation. This calculation has not been validated in all clinical situations. eGFR's persistently <60 mL/min signify possible Chronic Kidney Disease.   . Anion gap 01/25/2016 6  5 - 15 Final  . WBC 01/25/2016 7.6  4.0 - 10.5  K/uL Final  . RBC 01/25/2016 4.94  4.22 - 5.81 MIL/uL Final  . Hemoglobin 01/25/2016 14.2  13.0 - 17.0 g/dL Final  . HCT 01/25/2016 40.9  39.0 - 52.0 % Final  . MCV 01/25/2016 82.8  78.0 - 100.0 fL Final  . MCH 01/25/2016 28.7  26.0 - 34.0 pg Final  . MCHC 01/25/2016 34.7  30.0 - 36.0 g/dL Final  . RDW 01/25/2016 12.8  11.5 - 15.5 % Final  . Platelets 01/25/2016 289  150 - 400 K/uL Final  . ABO/RH(D) 02/04/2016 O POS   Final  . Antibody Screen 02/04/2016 NEG   Final  . Sample Expiration 02/04/2016 02/07/2016   Final  . Extend sample reason 02/04/2016 NO TRANSFUSIONS OR PREGNANCY IN THE PAST 3 MONTHS   Final  . ABO/RH(D) 01/25/2016 O POS   Final   No results for input(s): HGB in the last 72 hours. No results for input(s): WBC, RBC, HCT, PLT in the last 72 hours. No results for input(s): NA, K, CL, CO2, BUN, CREATININE, GLUCOSE, CALCIUM in the last 72 hours. No results for input(s): LABPT, INR in the last 72 hours.  X-Rays:Dg Lumbar Spine 2-3 Views  Addendum Date: 02/02/2016   ADDENDUM REPORT: 02/02/2016 07:58 ADDENDUM: Lumbar vertebra are numbered with the lowest segmented appearing lumbar shaped vertebra on lateral view as L5.  T12 ribs are hypoplastic. Electronically Signed   By: Marcello Moores  Register   On: 02/02/2016 07:58   Result Date: 02/02/2016 CLINICAL DATA:  Spinal stenosis. EXAM: LUMBAR SPINE - 2-3 VIEW COMPARISON:  CT 03/02/2014. FINDINGS: No acute bony abnormality identified. Diffuse multilevel degenerative change . Normal alignment and mineralization. IMPRESSION: Diffuse degenerative change.  No acute abnormality . Electronically Signed: By: Marcello Moores  Register On: 01/25/2016 10:20   Dg Spine Portable 1 View  Result Date: 02/04/2016 CLINICAL DATA:  Intraoperative localization EXAM: PORTABLE SPINE - 1 VIEW COMPARISON:  01/25/2016 FINDINGS: Posterior surgical instruments are directed at the L4-5 and L5-S1 levels. IMPRESSION: Intraoperative localization as above. Electronically Signed    By: Rolm Baptise M.D.   On: 02/04/2016 11:59   Dg Spine Portable 1 View  Result Date: 02/04/2016 CLINICAL DATA:  Lumbar decompression of L4-5 and L5-S1. EXAM: PORTABLE SPINE - 1 VIEW COMPARISON:  Radiographs of same day. FINDINGS: Single intraoperative cross-table lateral projection of the lumbar spine demonstrates surgical probes directed toward the posterior elements of L4 and L5. Surgical retractors are noted in the posterior soft tissues is well. IMPRESSION: Surgical localization as described above. Electronically Signed   By: Marijo Conception, M.D.   On: 02/04/2016 10:54   Dg Spine Portable 1 View  Result Date: 02/04/2016 CLINICAL DATA:  Decompression of L4-5 and L5-S1. EXAM: PORTABLE SPINE - 1 VIEW COMPARISON:  Radiographs of January 25, 2016. MRI of January 05, 2016. FINDINGS: Single intraoperative cross-table lateral projection of the lumbar spine demonstrates surgical probe directed toward the posterior spinous process of L3, with another surgical probe directed toward the posterior interspinous space of L4-5. IMPRESSION: Surgical localization as described above. Electronically Signed   By: Marijo Conception, M.D.   On: 02/04/2016 10:40    EKG: Orders placed or performed during the hospital encounter of 01/25/16  . EKG 12 lead  . EKG 12 lead     Hospital Course: Patient was admitted to [facility] and taken to the OR and underwent the above state procedure without complications.  Patient tolerated the procedure well and was later transferred to the recovery room and then to the orthopaedic floor for postoperative care.  They were given PO and IV analgesics for pain control following their surgery.  They were given 24 hours of postoperative antibiotics.   PT was consulted postop to assist with mobility and transfers.  The patient was allowed to be WBAT with therapy and was taught back precautions. Discharge planning was consulted to help with postop disposition and equipment  needs.  Patient had a good night on the evening of surgery and started to get up OOB with therapy on day one. Patient was seen in rounds and was ready to go home on day one.  They were given discharge instructions and dressing directions.  They were instructed on when to follow up in the office with Dr. Tonita Cong.   Diet: Regular diet Activity:WBAT; Lspine precautions Follow-up:in 10-14 days Disposition - Home Discharged Condition: good   Discharge Instructions    Call MD / Call 911    Complete by:  As directed    If you experience chest pain or shortness of breath, CALL 911 and be transported to the hospital emergency room.  If you develope a fever above 101 F, pus (white drainage) or increased drainage or redness at the wound, or calf pain, call your surgeon's office.   Constipation Prevention    Complete by:  As directed  Drink plenty of fluids.  Prune juice may be helpful.  You may use a stool softener, such as Colace (over the counter) 100 mg twice a day.  Use MiraLax (over the counter) for constipation as needed.   Diet - low sodium heart healthy    Complete by:  As directed    Increase activity slowly as tolerated    Complete by:  As directed      Allergies as of 02/05/2016   No Known Allergies     Medication List    STOP taking these medications   atorvastatin 10 MG tablet Commonly known as:  LIPITOR   HYDROcodone-acetaminophen 5-325 MG tablet Commonly known as:  NORCO   losartan 100 MG tablet Commonly known as:  COZAAR   predniSONE 20 MG tablet Commonly known as:  DELTASONE     TAKE these medications   docusate sodium 100 MG capsule Commonly known as:  COLACE Take 1 capsule (100 mg total) by mouth 2 (two) times daily as needed for mild constipation.   ibuprofen 200 MG tablet Commonly known as:  ADVIL,MOTRIN Take 4 tablets (800 mg total) by mouth every 6 (six) hours as needed for mild pain. May resume 5 days post-op What changed:  additional instructions     methocarbamol 500 MG tablet Commonly known as:  ROBAXIN Take 1 tablet (500 mg total) by mouth every 6 (six) hours as needed for muscle spasms. What changed:  when to take this  reasons to take this   oxyCODONE-acetaminophen 10-325 MG tablet Commonly known as:  PERCOCET Take 1-2 tablets by mouth every 4 (four) hours as needed for pain (severe).   polyethylene glycol packet Commonly known as:  MIRALAX / GLYCOLAX Take 17 g by mouth daily.      Follow-up Information    BEANE,JEFFREY C, MD Follow up in 2 week(s).   Specialty:  Orthopedic Surgery Contact information: [address] [place] [address] [phone]        Johnn Hai, MD In 2 weeks.   Specialty:  Orthopedic Surgery Contact information: [address] [place] [address] [phone]           Signed: Lacie Draft, PA-C Orthopaedic Surgery 02/05/2016, 8:05 AM

## 2016-02-27 IMAGING — US US ABDOMEN COMPLETE
1 series · 14 of 25 positions shown · non-contrast
Comparison: CT, 03/02/2014

CLINICAL DATA: Right upper quadrant abdominal pain. Abdominal mass.

EXAM:
ULTRASOUND ABDOMEN COMPLETE

[Series 1: us abdomen complete · 0.30mm/px · 14 of 99 slices shown]
[im 1/99]
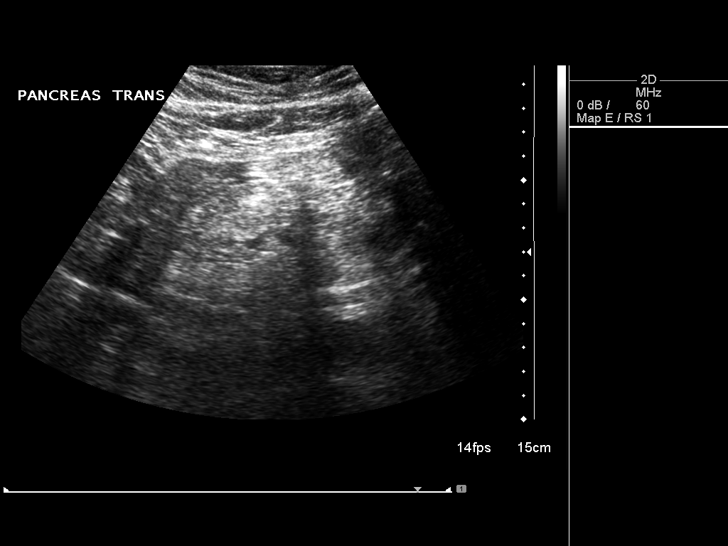
[im 9/99]
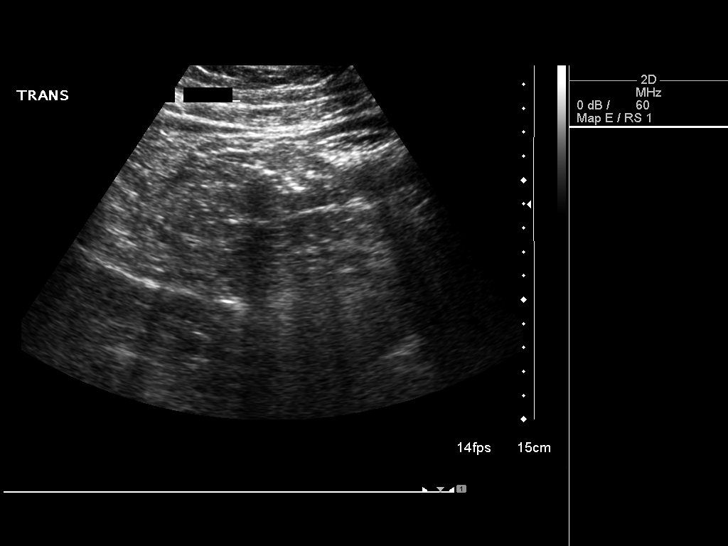
[im 17/99]
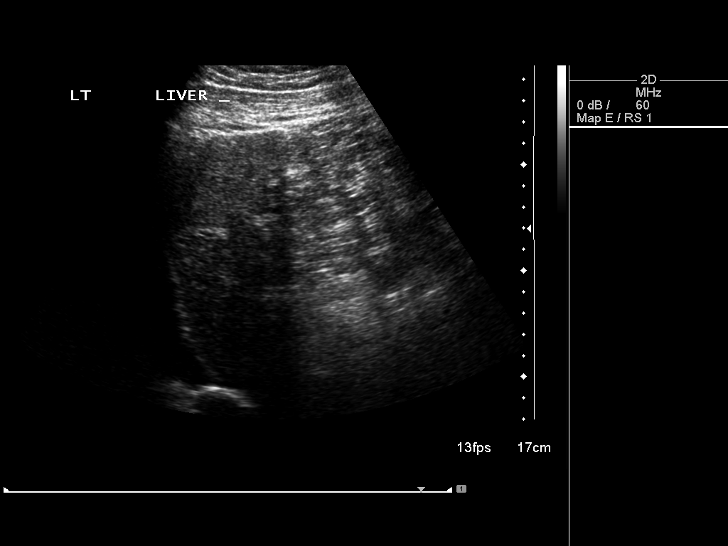
[im 25/99]
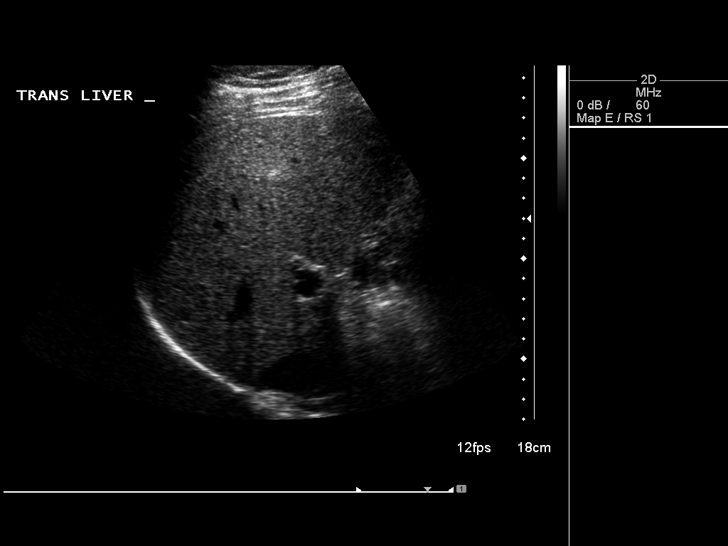
[im 33/99]
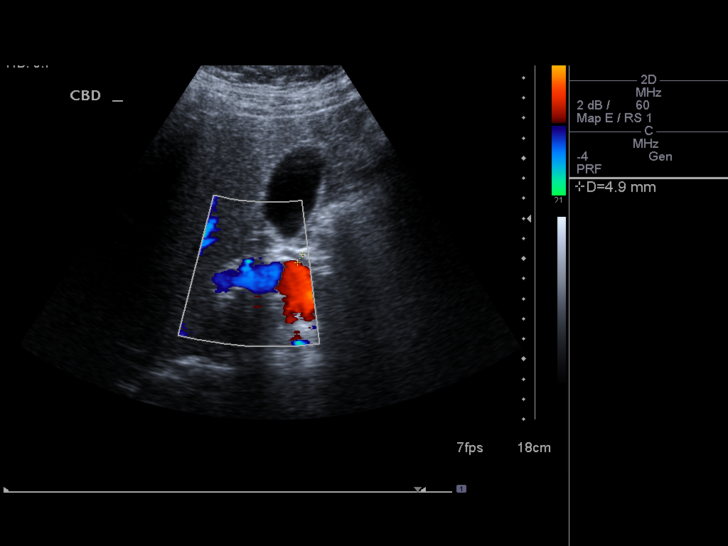
[im 37/99]
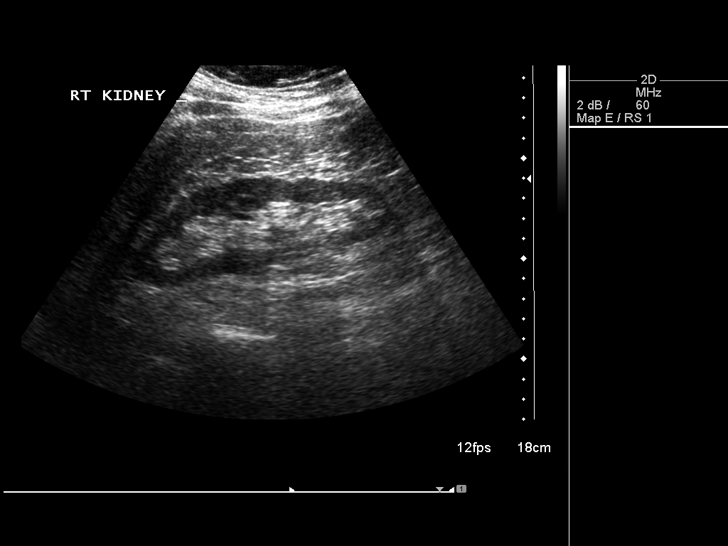
[im 45/99]
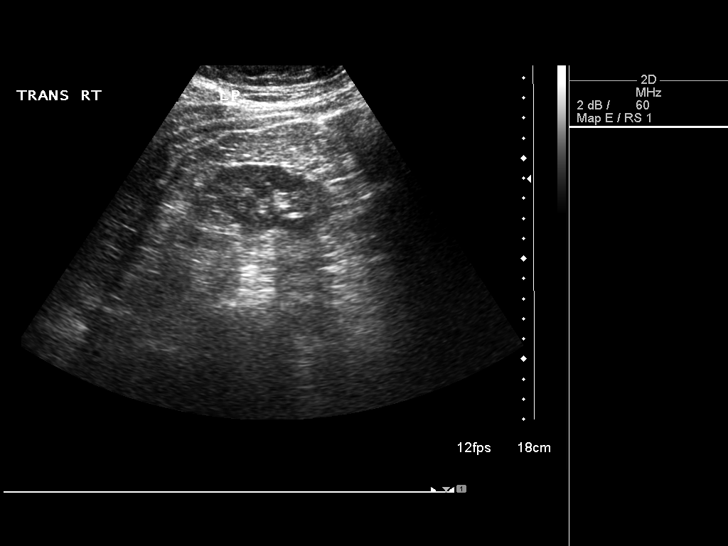
[im 54/99]
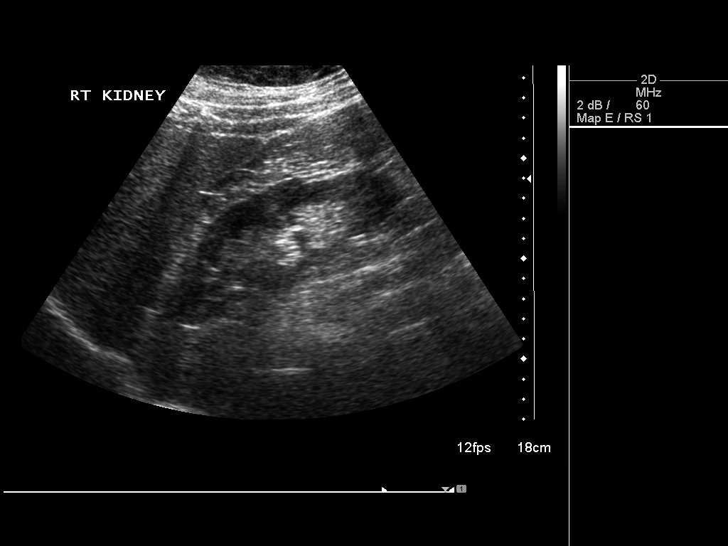
[im 62/99]
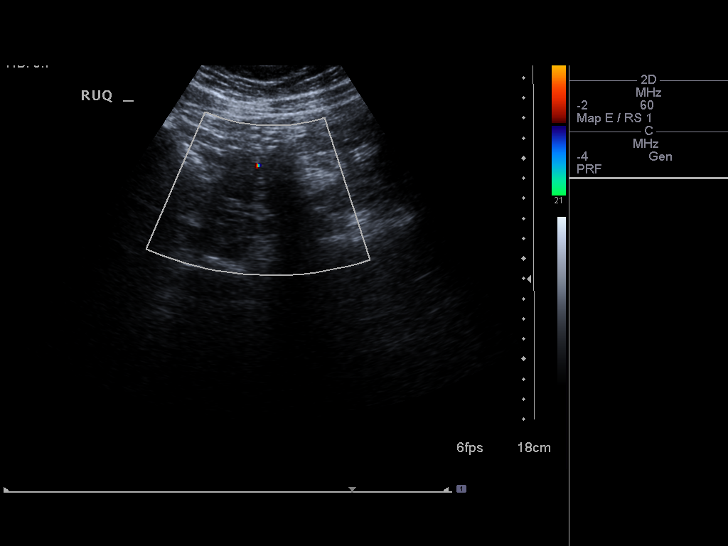
[im 66/99]
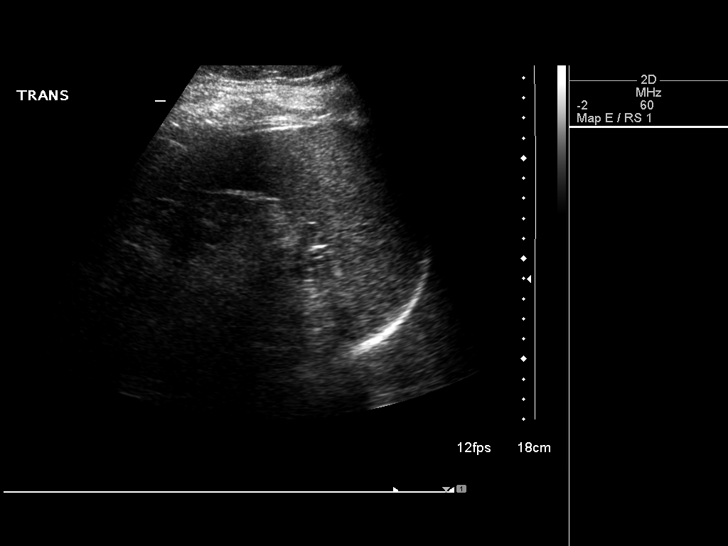
[im 74/99]
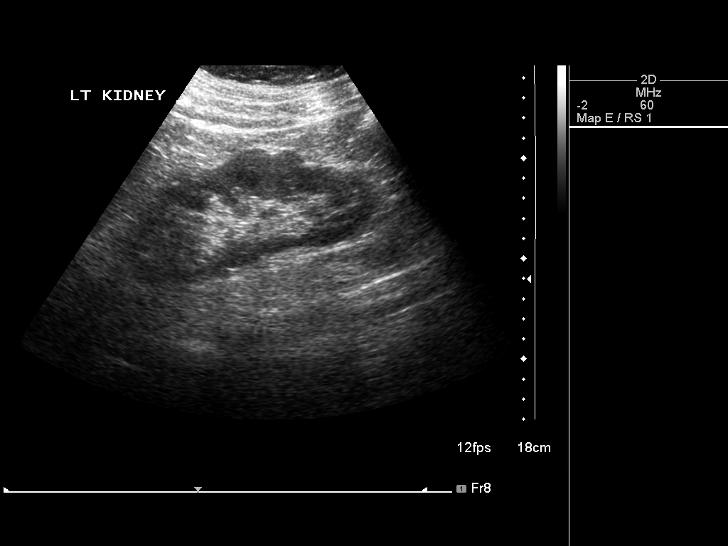
[im 82/99]
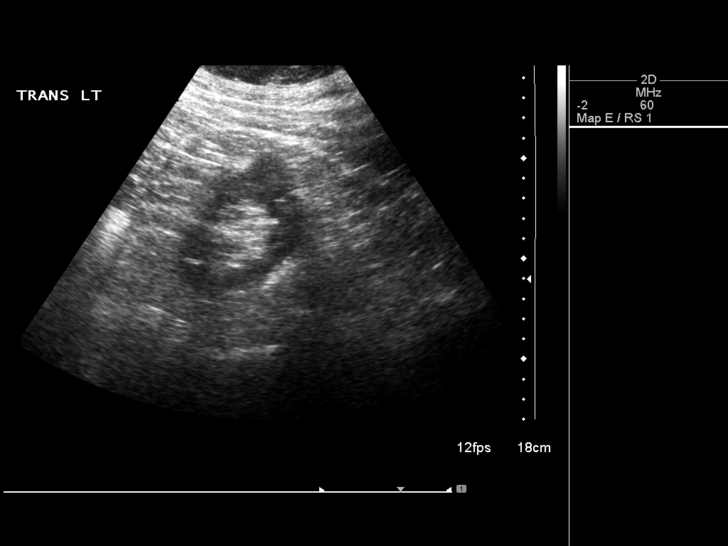
[im 90/99]
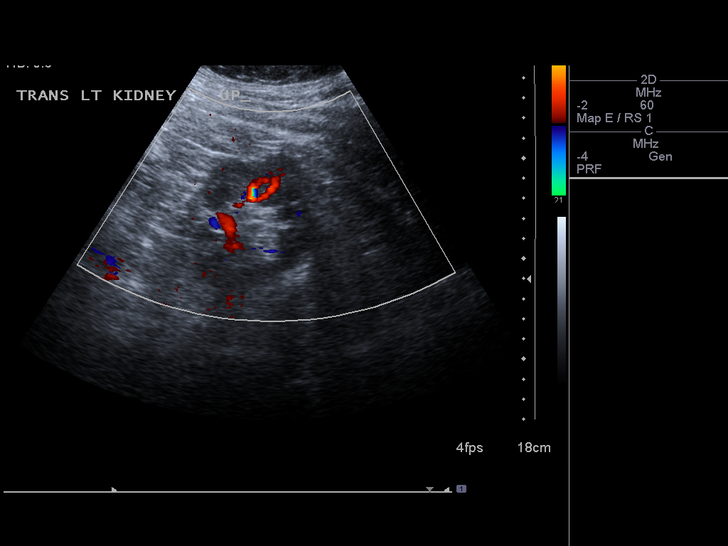
[im 99/99]
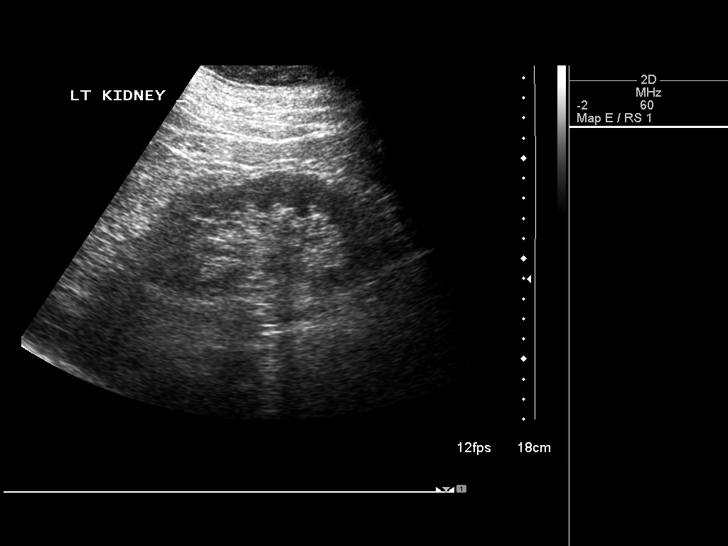

[14 of 25 positions shown; findings below may reference images not displayed]

FINDINGS: Gallbladder: No gallstones or wall thickening visualized. No
sonographic Murphy sign noted.

Common bile duct: Diameter: 4.9 mm

Liver: No focal lesion identified. Within normal limits in
parenchymal echogenicity.

IVC: No abnormality visualized.

Pancreas: Not visualized.

Spleen: Size and appearance within normal limits.

Right Kidney: Length: 13.5 cm. Normal parenchymal echogenicity.
Exophytic midpole cyst measuring 3 cm. No solid masses. No
hydronephrosis. Intrarenal calculi are noted.

Left Kidney: Length: 13.1 cm. Normal parenchymal echogenicity.
Lobular contour. No discrete mass or stone. No hydronephrosis.

Abdominal aorta: No aneurysm visualized.

Other findings: Patient's palpable knot in the right upper quadrant
overlies an area of bowel. No mass is seen.
IMPRESSION: 1. No abdominal mass.
2. No acute findings.  Normal gallbladder.  No bile duct dilation.
3. Pancreas not well seen.
4. Intrarenal stones in the right kidney. Right renal cysts. No
hydronephrosis.

## 2017-10-02 ENCOUNTER — Ambulatory Visit: Payer: Self-pay | Admitting: *Deleted

## 2017-10-02 NOTE — Telephone Encounter (Signed)
Wife initially called in for him but put him on the phone at my request. I've had a headache for 2-3 days.   I checked my BP over the weekend and noted it was a little high.     I was on medication a few years ago but my BP was fine so they took me off of it.  His last visit was in 2017 with Dr. Katrinka BlazingSmith. I scheduled him with Dr. Neva SeatGreene for 3:20 on 10/03/17.  I went over the s/s to look for to go to the ED: Dizziness, visual changes, weakness on one side of his body, headache become severe, speech changes.   He verbalized understanding and was agreeable to this plan.  Reason for Disposition . [1] Systolic BP  >= 130 OR Diastolic >= 80 AND [2] not taking BP medications  Answer Assessment - Initial Assessment Questions 1. BLOOD PRESSURE: "What is the blood pressure?" "Did you take at least two measurements 5 minutes apart?"     Over the weekend.  CHecked twice Saturday.   I've had a headache all weekend.   166/96.   Yesterday.     Last night 138/96.    2. ONSET: "When did you take your blood pressure?"     Yesterday.    I'm not at home now to check it. 3. HOW: "How did you obtain the blood pressure?" (e.g., visiting nurse, automatic home BP monitor)     Automatic BP machine. 4. HISTORY: "Do you have a history of high blood pressure?"     Yes.    I'm not on medication now.   My BP was fine so they took me off of it. 5. MEDICATIONS: "Are you taking any medications for blood pressure?" "Have you missed any doses recently?"     Not now 6. OTHER SYMPTOMS: "Do you have any symptoms?" (e.g., headache, chest pain, blurred vision, difficulty breathing, weakness)     Headache for 2-3 days.   It comes and goes. 7. PREGNANCY: "Is there any chance you are pregnant?" "When was your last menstrual period?"     N/A  Protocols used: HIGH BLOOD PRESSURE-A-AH

## 2017-10-03 ENCOUNTER — Other Ambulatory Visit: Payer: Self-pay

## 2017-10-03 ENCOUNTER — Ambulatory Visit (INDEPENDENT_AMBULATORY_CARE_PROVIDER_SITE_OTHER): Payer: Managed Care, Other (non HMO) | Admitting: Family Medicine

## 2017-10-03 ENCOUNTER — Encounter: Payer: Self-pay | Admitting: Family Medicine

## 2017-10-03 VITALS — BP 144/92 | HR 83 | Temp 98.3°F | Ht 75.0 in | Wt 297.4 lb

## 2017-10-03 DIAGNOSIS — I10 Essential (primary) hypertension: Secondary | ICD-10-CM

## 2017-10-03 DIAGNOSIS — R51 Headache: Secondary | ICD-10-CM | POA: Diagnosis not present

## 2017-10-03 DIAGNOSIS — R519 Headache, unspecified: Secondary | ICD-10-CM

## 2017-10-03 MED ORDER — LOSARTAN POTASSIUM 100 MG PO TABS: 100.0000 mg | ORAL_TABLET | Freq: Every day | ORAL | 0 refills | Status: DC

## 2017-10-03 NOTE — Progress Notes (Signed)
Subjective:    Patient ID: Parker Saunders, male    DOB: 1965-09-30, 52 y.o.   MRN: [id number]  HPI Parker Saunders is a 52 y.o. male Presents today for: Chief Complaint  Patient presents with  . Hypertension  . Headache    off and on for 2-3 days    Hypertension: BP Readings from Last 3 Encounters:  10/03/17 (!) 144/92  02/05/16 125/65  01/25/16 (!) 141/89   Lab Results  Component Value Date   CREATININE 0.88 01/25/2016   Wt Readings from Last 3 Encounters:  10/03/17 297 lb 6.4 oz (134.9 kg)  02/04/16 270 lb 3.2 oz (122.6 kg)  01/25/16 270 lb 3.2 oz (122.6 kg)   History of hypertension.  Last office visit in 2017 with Dr. Katrinka Blazing.  Telephone note reviewed yesterday.  Headache for a few days blood pressure was a little elevated over the weekend.  Previously had been on losartan 100mg  QD when seen by Dr. Katrinka Blazing in March 2017.  Plan for six-month follow-up for physical at that time.  As above his weight has increased approximately 27 pounds since December 2017. Off meds for 1-2 years. Tolerated losartan ok. Not fasting today - ate 4 hours ago.   Hyperlipidemia:  Lab Results  Component Value Date   CHOL 203 (H) 04/23/2015   HDL 34 (L) 04/23/2015   LDLCALC 131 (H) 04/23/2015   TRIG 191 (H) 04/23/2015   CHOLHDL 6.0 (H) 04/23/2015   Lab Results  Component Value Date   ALT 33 04/23/2015   AST 24 04/23/2015   ALKPHOS 78 04/23/2015   BILITOT 0.6 04/23/2015  had recommended lovastatin 10mg  qhs 0 unsure if he took it. Not fasting today - plan to repeat bloodwork in 2 weeks.   Home BP's 165/99 this morning. 170/100 at home this weekend when had headache. Typical HA as when off BP med in past. No new symptoms.  No slurred speech, weakness, chest pains/dyspnea, blurry vision.   Patient Active Problem List   Diagnosis Date Noted  . Spinal stenosis of lumbar region 02/04/2016  . Unspecified essential hypertension 01/25/2013   Past Medical History:  Diagnosis Date  .  Hypertension   . Renal stone   . Rotator cuff disorder    Past Surgical History:  Procedure Laterality Date  . BACK SURGERY  2005  . LUMBAR LAMINECTOMY/DECOMPRESSION MICRODISCECTOMY Left 02/04/2016   Procedure: REDO MICRO LUMBAR DECOMPRESSION L4-5, L5-S1 ON THE LEFT  2 LEVELS;  Surgeon: [name], MD;  Location: [facility];  Service: Orthopedics;  Laterality: Left;  . SHOULDER SURGERY Right 2014  . SPINE SURGERY  02/07/2005   [facility]   No Known Allergies Prior to Admission medications   Not on File   Social History   Socioeconomic History  . Marital status: Married    Spouse name: Not on file  . Number of children: 2  . Years of education: Not on file  . Highest education level: Not on file  Occupational History  . Occupation: unemployed    Comment: since 2015  Social Needs  . Financial resource strain: Not on file  . Food insecurity:    Worry: Not on file    Inability: Not on file  . Transportation needs:    Medical: Not on file    Non-medical: Not on file  Tobacco Use  . Smoking status: Never Smoker  . Smokeless tobacco: Never Used  Substance and Sexual Activity  . Alcohol use: No  . Drug use: No  .  Sexual activity: Yes  Lifestyle  . Physical activity:    Days per week: Not on file    Minutes per session: Not on file  . Stress: Not on file  Relationships  . Social connections:    Talks on phone: Not on file    Gets together: Not on file    Attends religious service: Not on file    Active member of club or organization: Not on file    Attends meetings of clubs or organizations: Not on file    Relationship status: Not on file  . Intimate partner violence:    Fear of current or ex partner: Not on file    Emotionally abused: Not on file    Physically abused: Not on file    Forced sexual activity: Not on file  Other Topics Concern  . Not on file  Social History Narrative   Marital status: married      Children: 2 daughters      Lives: with wife, 2  daughters      Employment: unemployed after termination in 2015     Tobacco: none      Alcohol: none      Exercise:  Walking 4-5 miles daily    Review of Systems  Constitutional: Negative for fatigue and unexpected weight change.  Eyes: Negative for visual disturbance.  Respiratory: Negative for cough, chest tightness and shortness of breath.   Cardiovascular: Negative for chest pain, palpitations and leg swelling.  Gastrointestinal: Negative for abdominal pain and blood in stool.  Neurological: Positive for headaches (mild). Negative for dizziness, tremors, speech difficulty, weakness and light-headedness.        Objective:   Physical Exam  Constitutional: He is oriented to person, place, and time. He appears well-developed and well-nourished.  HENT:  Head: Normocephalic and atraumatic.  Eyes: Pupils are equal, round, and reactive to light. EOM are normal. Right eye exhibits no nystagmus. Left eye exhibits no nystagmus.  Neck: No JVD present. Carotid bruit is not present.  Cardiovascular: Normal rate, regular rhythm and normal heart sounds.  No murmur heard. Pulmonary/Chest: Effort normal and breath sounds normal. He has no rales.  Musculoskeletal: He exhibits no edema.  Neurological: He is alert and oriented to person, place, and time. He has normal strength. No sensory deficit. He displays a negative Romberg sign. Coordination and gait normal.  Nonfocal, no pronator drift.   Skin: Skin is warm and dry.  Psychiatric: He has a normal mood and affect.  Vitals reviewed.  Vitals:   10/03/17 1516 10/03/17 1518  BP: (!) 160/93 (!) 144/92  Pulse: 83   Temp: 98.3 F (36.8 C)   TempSrc: Oral   SpO2: 97%   Weight: 297 lb 6.4 oz (134.9 kg)   Height: 6\' 3"  (1.905 m)         Assessment & Plan:    Parker Saunders is a 52 y.o. male Essential hypertension - Plan: losartan (COZAAR) 100 MG tablet, Basic metabolic panel  Nonintractable headache, unspecified chronicity pattern,  unspecified headache type Headache likely related to untreated hypertension.  Nonfocal neurologic exam, similar symptoms as in past with elevated readings.    -Restart losartan 100 mg daily, check a BMP as nonfasting.  Recheck in 2 weeks with likely repeat renal function testing at that time with fasting lipid panel.  Hyperlipidemia noted previously, not currently on medications.  RTC/ER precautions given if new/worsening symptoms  Meds ordered this encounter  Medications  . losartan (COZAAR) 100 MG tablet  Sig: Take 1 tablet (100 mg total) by mouth daily.    Dispense:  90 tablet    Refill:  0   Patient Instructions   Start losartan at previous dose of 100 mg once a day.  I will check some basic electrolytes today, but follow-up with me in 2 weeks for repeat evaluation and fasting lab work.  Fasting should be 8 hours prior.  If any worsening headache or new/worsening symptoms, proceed to the emergency room.  Thank you for coming in today.  Hypertension Hypertension, commonly called high blood pressure, is when the force of blood pumping through the arteries is too strong. The arteries are the blood vessels that carry blood from the heart throughout the body. Hypertension forces the heart to work harder to pump blood and may cause arteries to become narrow or stiff. Having untreated or uncontrolled hypertension can cause heart attacks, strokes, kidney disease, and other problems. A blood pressure reading consists of a higher number over a lower number. Ideally, your blood pressure should be below 120/80. The first ("top") number is called the systolic pressure. It is a measure of the pressure in your arteries as your heart beats. The second ("bottom") number is called the diastolic pressure. It is a measure of the pressure in your arteries as the heart relaxes. What are the causes? The cause of this condition is not known. What increases the risk? Some risk factors for high blood pressure are  under your control. Others are not. Factors you can change  Smoking.  Having type 2 diabetes mellitus, high cholesterol, or both.  Not getting enough exercise or physical activity.  Being overweight.  Having too much fat, sugar, calories, or salt (sodium) in your diet.  Drinking too much alcohol. Factors that are difficult or impossible to change  Having chronic kidney disease.  Having a family history of high blood pressure.  Age. Risk increases with age.  Race. You may be at higher risk if you are African-American.  Gender. Men are at higher risk than women before age 27. After age 19, women are at higher risk than men.  Having obstructive sleep apnea.  Stress. What are the signs or symptoms? Extremely high blood pressure (hypertensive crisis) may cause:  Headache.  Anxiety.  Shortness of breath.  Nosebleed.  Nausea and vomiting.  Severe chest pain.  Jerky movements you cannot control (seizures).  How is this diagnosed? This condition is diagnosed by measuring your blood pressure while you are seated, with your arm resting on a surface. The cuff of the blood pressure monitor will be placed directly against the skin of your upper arm at the level of your heart. It should be measured at least twice using the same arm. Certain conditions can cause a difference in blood pressure between your right and left arms. Certain factors can cause blood pressure readings to be lower or higher than normal (elevated) for a short period of time:  When your blood pressure is higher when you are in a health care provider's office than when you are at home, this is called white coat hypertension. Most people with this condition do not need medicines.  When your blood pressure is higher at home than when you are in a health care provider's office, this is called masked hypertension. Most people with this condition may need medicines to control blood pressure.  If you have a high  blood pressure reading during one visit or you have normal blood pressure with other  risk factors:  You may be asked to return on a different day to have your blood pressure checked again.  You may be asked to monitor your blood pressure at home for 1 week or longer.  If you are diagnosed with hypertension, you may have other blood or imaging tests to help your health care provider understand your overall risk for other conditions. How is this treated? This condition is treated by making healthy lifestyle changes, such as eating healthy foods, exercising more, and reducing your alcohol intake. Your health care provider may prescribe medicine if lifestyle changes are not enough to get your blood pressure under control, and if:  Your systolic blood pressure is above 130.  Your diastolic blood pressure is above 80.  Your personal target blood pressure may vary depending on your medical conditions, your age, and other factors. Follow these instructions at home: Eating and drinking  Eat a diet that is high in fiber and potassium, and low in sodium, added sugar, and fat. An example eating plan is called the DASH (Dietary Approaches to Stop Hypertension) diet. To eat this way: ? Eat plenty of fresh fruits and vegetables. Try to fill half of your plate at each meal with fruits and vegetables. ? Eat whole grains, such as whole wheat pasta, brown rice, or whole grain bread. Fill about one quarter of your plate with whole grains. ? Eat or drink low-fat dairy products, such as skim milk or low-fat yogurt. ? Avoid fatty cuts of meat, processed or cured meats, and poultry with skin. Fill about one quarter of your plate with lean proteins, such as fish, chicken without skin, beans, eggs, and tofu. ? Avoid premade and processed foods. These tend to be higher in sodium, added sugar, and fat.  Reduce your daily sodium intake. Most people with hypertension should eat less than 1,500 mg of sodium a  day.  Limit alcohol intake to no more than 1 drink a day for nonpregnant women and 2 drinks a day for men. One drink equals 12 oz of beer, 5 oz of wine, or 1 oz of hard liquor. Lifestyle  Work with your health care provider to maintain a healthy body weight or to lose weight. Ask what an ideal weight is for you.  Get at least 30 minutes of exercise that causes your heart to beat faster (aerobic exercise) most days of the week. Activities may include walking, swimming, or biking.  Include exercise to strengthen your muscles (resistance exercise), such as pilates or lifting weights, as part of your weekly exercise routine. Try to do these types of exercises for 30 minutes at least 3 days a week.  Do not use any products that contain nicotine or tobacco, such as cigarettes and e-cigarettes. If you need help quitting, ask your health care provider.  Monitor your blood pressure at home as told by your health care provider.  Keep all follow-up visits as told by your health care provider. This is important. Medicines  Take over-the-counter and prescription medicines only as told by your health care provider. Follow directions carefully. Blood pressure medicines must be taken as prescribed.  Do not skip doses of blood pressure medicine. Doing this puts you at risk for problems and can make the medicine less effective.  Ask your health care provider about side effects or reactions to medicines that you should watch for. Contact a health care provider if:  You think you are having a reaction to a medicine you are taking.  You have headaches that keep coming back (recurring).  You feel dizzy.  You have swelling in your ankles.  You have trouble with your vision. Get help right away if:  You develop a severe headache or confusion.  You have unusual weakness or numbness.  You feel faint.  You have severe pain in your chest or abdomen.  You vomit repeatedly.  You have trouble  breathing. Summary  Hypertension is when the force of blood pumping through your arteries is too strong. If this condition is not controlled, it may put you at risk for serious complications.  Your personal target blood pressure may vary depending on your medical conditions, your age, and other factors. For most people, a normal blood pressure is less than 120/80.  Hypertension is treated with lifestyle changes, medicines, or a combination of both. Lifestyle changes include weight loss, eating a healthy, low-sodium diet, exercising more, and limiting alcohol. This information is not intended to replace advice given to you by your health care provider. Make sure you discuss any questions you have with your health care provider. Document Released: 01/24/2005 Document Revised: 12/23/2015 Document Reviewed: 12/23/2015 Elsevier Interactive Patient Education  2018 [employer]  How to Take Your Blood Pressure You can take your blood pressure at home with a machine. You may need to check your blood pressure at home:  To check if you have high blood pressure (hypertension).  To check your blood pressure over time.  To make sure your blood pressure medicine is working.  Supplies needed: You will need a blood pressure machine, or monitor. You can buy one at a drugstore or online. When choosing one:  Choose one with an arm cuff.  Choose one that wraps around your upper arm. Only one finger should fit between your arm and the cuff.  Do not choose one that measures your blood pressure from your wrist or finger.  Your doctor can suggest a monitor. How to prepare Avoid these things for 30 minutes before checking your blood pressure:  Drinking caffeine.  Drinking alcohol.  Eating.  Smoking.  Exercising.  Five minutes before checking your blood pressure:  Pee.  Sit in a dining chair. Avoid sitting in a soft couch or armchair.  Be quiet. Do not talk.  How to take your blood  pressure Follow the instructions that came with your machine. If you have a digital blood pressure monitor, these may be the instructions: 1. Sit up straight. 2. Place your feet on the floor. Do not cross your ankles or legs. 3. Rest your left arm at the level of your heart. You may rest it on a table, desk, or chair. 4. Pull up your shirt sleeve. 5. Wrap the blood pressure cuff around the upper part of your left arm. The cuff should be 1 inch (2.5 cm) above your elbow. It is best to wrap the cuff around bare skin. 6. Fit the cuff snugly around your arm. You should be able to place only one finger between the cuff and your arm. 7. Put the cord inside the groove of your elbow. 8. Press the power button. 9. Sit quietly while the cuff fills with air and loses air. 10. Write down the numbers on the screen. 11. Wait 2-3 minutes and then repeat steps 1-10.  What do the numbers mean? Two numbers make up your blood pressure. The first number is called systolic pressure. The second is called diastolic pressure. An example of a blood pressure reading is "120  over 80" (or 120/80). If you are an adult and do not have a medical condition, use this guide to find out if your blood pressure is normal: Normal  First number: below 120.  Second number: below 80. Elevated  First number: 120-129.  Second number: below 80. Hypertension stage 1  First number: 130-139.  Second number: 80-89. Hypertension stage 2  First number: 140 or above.  Second number: 90 or above. Your blood pressure is above normal even if only the top or bottom number is above normal. Follow these instructions at home:  Check your blood pressure as often as your doctor tells you to.  Take your monitor to your next doctor's appointment. Your doctor will: ? Make sure you are using it correctly. ? Make sure it is working right.  Make sure you understand what your blood pressure numbers should be.  Tell your doctor if your  medicines are causing side effects. Contact a doctor if:  Your blood pressure keeps being high. Get help right away if:  Your first blood pressure number is higher than 180.  Your second blood pressure number is higher than 120. This information is not intended to replace advice given to you by your health care provider. Make sure you discuss any questions you have with your health care provider. Document Released: 01/07/2008 Document Revised: 12/23/2015 Document Reviewed: 07/03/2015 Elsevier Interactive Patient Education  [employer].   If you have lab work done today you will be contacted with your lab results within the next 2 weeks.  If you have not heard from [place] then please contact us. The fastest way to get your results is to register for My Chart.   IF you received an x-ray today, you will receive an invoice from [facility]. Please contact [facility] at [phone] with questions or concerns regarding your invoice.   IF you received labwork today, you will receive an invoice from [place]. Please contact [facility] at [phone] with questions or concerns regarding your invoice.   Our billing staff will not be able to assist you with questions regarding bills from these companies.  You will be contacted with the lab results as soon as they are available. The fastest way to get your results is to activate your My Chart account. Instructions are located on the last page of this paperwork. If you have not heard from [place] regarding the results in 2 weeks, please contact this office.       Signed,   Meredith Staggers, MD [facility].  10/03/17 4:03 PM

## 2017-10-03 NOTE — Patient Instructions (Addendum)
Start losartan at previous dose of 100 mg once a day.  I will check some basic electrolytes today, but follow-up with me in 2 weeks for repeat evaluation and fasting lab work.  Fasting should be 8 hours prior.  If any worsening headache or new/worsening symptoms, proceed to the emergency room.  Thank you for coming in today.  Hypertension Hypertension, commonly called high blood pressure, is when the force of blood pumping through the arteries is too strong. The arteries are the blood vessels that carry blood from the heart throughout the body. Hypertension forces the heart to work harder to pump blood and may cause arteries to become narrow or stiff. Having untreated or uncontrolled hypertension can cause heart attacks, strokes, kidney disease, and other problems. A blood pressure reading consists of a higher number over a lower number. Ideally, your blood pressure should be below 120/80. The first ("top") number is called the systolic pressure. It is a measure of the pressure in your arteries as your heart beats. The second ("bottom") number is called the diastolic pressure. It is a measure of the pressure in your arteries as the heart relaxes. What are the causes? The cause of this condition is not known. What increases the risk? Some risk factors for high blood pressure are under your control. Others are not. Factors you can change  Smoking.  Having type 2 diabetes mellitus, high cholesterol, or both.  Not getting enough exercise or physical activity.  Being overweight.  Having too much fat, sugar, calories, or salt (sodium) in your diet.  Drinking too much alcohol. Factors that are difficult or impossible to change  Having chronic kidney disease.  Having a family history of high blood pressure.  Age. Risk increases with age.  Race. You may be at higher risk if you are African-American.  Gender. Men are at higher risk than women before age 33. After age 33, women are at higher  risk than men.  Having obstructive sleep apnea.  Stress. What are the signs or symptoms? Extremely high blood pressure (hypertensive crisis) may cause:  Headache.  Anxiety.  Shortness of breath.  Nosebleed.  Nausea and vomiting.  Severe chest pain.  Jerky movements you cannot control (seizures).  How is this diagnosed? This condition is diagnosed by measuring your blood pressure while you are seated, with your arm resting on a surface. The cuff of the blood pressure monitor will be placed directly against the skin of your upper arm at the level of your heart. It should be measured at least twice using the same arm. Certain conditions can cause a difference in blood pressure between your right and left arms. Certain factors can cause blood pressure readings to be lower or higher than normal (elevated) for a short period of time:  When your blood pressure is higher when you are in a health care provider's office than when you are at home, this is called white coat hypertension. Most people with this condition do not need medicines.  When your blood pressure is higher at home than when you are in a health care provider's office, this is called masked hypertension. Most people with this condition may need medicines to control blood pressure.  If you have a high blood pressure reading during one visit or you have normal blood pressure with other risk factors:  You may be asked to return on a different day to have your blood pressure checked again.  You may be asked to monitor your blood pressure  at home for 1 week or longer.  If you are diagnosed with hypertension, you may have other blood or imaging tests to help your health care provider understand your overall risk for other conditions. How is this treated? This condition is treated by making healthy lifestyle changes, such as eating healthy foods, exercising more, and reducing your alcohol intake. Your health care provider may  prescribe medicine if lifestyle changes are not enough to get your blood pressure under control, and if:  Your systolic blood pressure is above 130.  Your diastolic blood pressure is above 80.  Your personal target blood pressure may vary depending on your medical conditions, your age, and other factors. Follow these instructions at home: Eating and drinking  Eat a diet that is high in fiber and potassium, and low in sodium, added sugar, and fat. An example eating plan is called the DASH (Dietary Approaches to Stop Hypertension) diet. To eat this way: ? Eat plenty of fresh fruits and vegetables. Try to fill half of your plate at each meal with fruits and vegetables. ? Eat whole grains, such as whole wheat pasta, brown rice, or whole grain bread. Fill about one quarter of your plate with whole grains. ? Eat or drink low-fat dairy products, such as skim milk or low-fat yogurt. ? Avoid fatty cuts of meat, processed or cured meats, and poultry with skin. Fill about one quarter of your plate with lean proteins, such as fish, chicken without skin, beans, eggs, and tofu. ? Avoid premade and processed foods. These tend to be higher in sodium, added sugar, and fat.  Reduce your daily sodium intake. Most people with hypertension should eat less than 1,500 mg of sodium a day.  Limit alcohol intake to no more than 1 drink a day for nonpregnant women and 2 drinks a day for men. One drink equals 12 oz of beer, 5 oz of wine, or 1 oz of hard liquor. Lifestyle  Work with your health care provider to maintain a healthy body weight or to lose weight. Ask what an ideal weight is for you.  Get at least 30 minutes of exercise that causes your heart to beat faster (aerobic exercise) most days of the week. Activities may include walking, swimming, or biking.  Include exercise to strengthen your muscles (resistance exercise), such as pilates or lifting weights, as part of your weekly exercise routine. Try to do  these types of exercises for 30 minutes at least 3 days a week.  Do not use any products that contain nicotine or tobacco, such as cigarettes and e-cigarettes. If you need help quitting, ask your health care provider.  Monitor your blood pressure at home as told by your health care provider.  Keep all follow-up visits as told by your health care provider. This is important. Medicines  Take over-the-counter and prescription medicines only as told by your health care provider. Follow directions carefully. Blood pressure medicines must be taken as prescribed.  Do not skip doses of blood pressure medicine. Doing this puts you at risk for problems and can make the medicine less effective.  Ask your health care provider about side effects or reactions to medicines that you should watch for. Contact a health care provider if:  You think you are having a reaction to a medicine you are taking.  You have headaches that keep coming back (recurring).  You feel dizzy.  You have swelling in your ankles.  You have trouble with your vision. Get help right  away if:  You develop a severe headache or confusion.  You have unusual weakness or numbness.  You feel faint.  You have severe pain in your chest or abdomen.  You vomit repeatedly.  You have trouble breathing. Summary  Hypertension is when the force of blood pumping through your arteries is too strong. If this condition is not controlled, it may put you at risk for serious complications.  Your personal target blood pressure may vary depending on your medical conditions, your age, and other factors. For most people, a normal blood pressure is less than 120/80.  Hypertension is treated with lifestyle changes, medicines, or a combination of both. Lifestyle changes include weight loss, eating a healthy, low-sodium diet, exercising more, and limiting alcohol. This information is not intended to replace advice given to you by your health  care provider. Make sure you discuss any questions you have with your health care provider. Document Released: 01/24/2005 Document Revised: 12/23/2015 Document Reviewed: 12/23/2015 Elsevier Interactive Patient Education  2018 [employer].  How to Take Your Blood Pressure You can take your blood pressure at home with a machine. You may need to check your blood pressure at home:  To check if you have high blood pressure (hypertension).  To check your blood pressure over time.  To make sure your blood pressure medicine is working.  Supplies needed: You will need a blood pressure machine, or monitor. You can buy one at a drugstore or online. When choosing one:  Choose one with an arm cuff.  Choose one that wraps around your upper arm. Only one finger should fit between your arm and the cuff.  Do not choose one that measures your blood pressure from your wrist or finger.  Your doctor can suggest a monitor. How to prepare Avoid these things for 30 minutes before checking your blood pressure:  Drinking caffeine.  Drinking alcohol.  Eating.  Smoking.  Exercising.  Five minutes before checking your blood pressure:  Pee.  Sit in a dining chair. Avoid sitting in a soft couch or armchair.  Be quiet. Do not talk.  How to take your blood pressure Follow the instructions that came with your machine. If you have a digital blood pressure monitor, these may be the instructions: 1. Sit up straight. 2. Place your feet on the floor. Do not cross your ankles or legs. 3. Rest your left arm at the level of your heart. You may rest it on a table, desk, or chair. 4. Pull up your shirt sleeve. 5. Wrap the blood pressure cuff around the upper part of your left arm. The cuff should be 1 inch (2.5 cm) above your elbow. It is best to wrap the cuff around bare skin. 6. Fit the cuff snugly around your arm. You should be able to place only one finger between the cuff and your arm. 7. Put the cord  inside the groove of your elbow. 8. Press the power button. 9. Sit quietly while the cuff fills with air and loses air. 10. Write down the numbers on the screen. 11. Wait 2-3 minutes and then repeat steps 1-10.  What do the numbers mean? Two numbers make up your blood pressure. The first number is called systolic pressure. The second is called diastolic pressure. An example of a blood pressure reading is "120 over 80" (or 120/80). If you are an adult and do not have a medical condition, use this guide to find out if your blood pressure is normal: Normal  First number: below 120.  Second number: below 80. Elevated  First number: 120-129.  Second number: below 80. Hypertension stage 1  First number: 130-139.  Second number: 80-89. Hypertension stage 2  First number: 140 or above.  Second number: 90 or above. Your blood pressure is above normal even if only the top or bottom number is above normal. Follow these instructions at home:  Check your blood pressure as often as your doctor tells you to.  Take your monitor to your next doctor's appointment. Your doctor will: ? Make sure you are using it correctly. ? Make sure it is working right.  Make sure you understand what your blood pressure numbers should be.  Tell your doctor if your medicines are causing side effects. Contact a doctor if:  Your blood pressure keeps being high. Get help right away if:  Your first blood pressure number is higher than 180.  Your second blood pressure number is higher than 120. This information is not intended to replace advice given to you by your health care provider. Make sure you discuss any questions you have with your health care provider. Document Released: 01/07/2008 Document Revised: 12/23/2015 Document Reviewed: 07/03/2015 Elsevier Interactive Patient Education  [employer].   If you have lab work done today you will be contacted with your lab results within the next 2  weeks.  If you have not heard from [place] then please contact us. The fastest way to get your results is to register for My Chart.   IF you received an x-ray today, you will receive an invoice from [facility]. Please contact [facility] at [phone] with questions or concerns regarding your invoice.   IF you received labwork today, you will receive an invoice from [facility]. Please contact [facility] at [phone] with questions or concerns regarding your invoice.   Our billing staff will not be able to assist you with questions regarding bills from these companies.  You will be contacted with the lab results as soon as they are available. The fastest way to get your results is to activate your My Chart account. Instructions are located on the last page of this paperwork. If you have not heard from [place] regarding the results in 2 weeks, please contact this office.

## 2017-10-04 LAB — BASIC METABOLIC PANEL
BUN / CREAT RATIO: 16 (ref 9–20)
BUN: 14 mg/dL (ref 6–24)
CHLORIDE: 104 mmol/L (ref 96–106)
CO2: 24 mmol/L (ref 20–29)
Calcium: 9.5 mg/dL (ref 8.7–10.2)
Creatinine, Ser: 0.88 mg/dL (ref 0.76–1.27)
GFR calc non Af Amer: 98 mL/min/{1.73_m2} (ref >59)
GFR, EST AFRICAN AMERICAN: 113 mL/min/{1.73_m2} (ref >59)
GLUCOSE: 80 mg/dL (ref 65–99)
Potassium: 4.8 mmol/L (ref 3.5–5.2)
SODIUM: 142 mmol/L (ref 134–144)

## 2017-10-17 ENCOUNTER — Other Ambulatory Visit: Payer: Self-pay

## 2017-10-17 ENCOUNTER — Ambulatory Visit: Payer: Self-pay | Admitting: Family Medicine

## 2017-10-17 ENCOUNTER — Encounter: Payer: Self-pay | Admitting: Family Medicine

## 2017-10-17 VITALS — BP 144/98 | HR 83 | Temp 97.8°F | Ht 75.0 in | Wt 298.0 lb

## 2017-10-17 DIAGNOSIS — E785 Hyperlipidemia, unspecified: Secondary | ICD-10-CM

## 2017-10-17 DIAGNOSIS — I10 Essential (primary) hypertension: Secondary | ICD-10-CM

## 2017-10-17 LAB — COMPREHENSIVE METABOLIC PANEL
A/G RATIO: 1.7 (ref 1.2–2.2)
ALK PHOS: 81 IU/L (ref 39–117)
ALT: 30 IU/L (ref 0–44)
AST: 24 IU/L (ref 0–40)
Albumin: 4.5 g/dL (ref 3.5–5.5)
BUN/Creatinine Ratio: 15 (ref 9–20)
BUN: 17 mg/dL (ref 6–24)
Bilirubin Total: 0.5 mg/dL (ref 0.0–1.2)
CALCIUM: 9.5 mg/dL (ref 8.7–10.2)
CO2: 20 mmol/L (ref 20–29)
Chloride: 103 mmol/L (ref 96–106)
Creatinine, Ser: 1.1 mg/dL (ref 0.76–1.27)
GFR calc Af Amer: 88 mL/min/{1.73_m2} (ref >59)
GFR, EST NON AFRICAN AMERICAN: 76 mL/min/{1.73_m2} (ref >59)
GLOBULIN, TOTAL: 2.7 g/dL (ref 1.5–4.5)
Glucose: 86 mg/dL (ref 65–99)
Potassium: 4.3 mmol/L (ref 3.5–5.2)
Sodium: 137 mmol/L (ref 134–144)
Total Protein: 7.2 g/dL (ref 6.0–8.5)

## 2017-10-17 LAB — LIPID PANEL
CHOLESTEROL TOTAL: 216 mg/dL — AB (ref 100–199)
Chol/HDL Ratio: 7 ratio — ABNORMAL HIGH (ref 0.0–5.0)
HDL: 31 mg/dL — AB (ref >39)
LDL Calculated: 142 mg/dL — ABNORMAL HIGH (ref 0–99)
TRIGLYCERIDES: 217 mg/dL — AB (ref 0–149)
VLDL Cholesterol Cal: 43 mg/dL — ABNORMAL HIGH (ref 5–40)

## 2017-10-17 MED ORDER — LOSARTAN POTASSIUM-HCTZ 100-12.5 MG PO TABS: 1.0000 | ORAL_TABLET | Freq: Every day | ORAL | 1 refills | Status: DC

## 2017-10-17 NOTE — Progress Notes (Signed)
Subjective:  By signing my name below, I, Essence Howell, attest that this documentation has been prepared under the direction and in the presence of Shade Flood, MD Electronically Signed: Charline Bills, ED Scribe 10/17/2017 at 11:12 AM.   Patient ID: Parker Saunders, male    DOB: Jan 09, 1966, 52 y.o.   MRN: [id number]  Chief Complaint  Patient presents with  . Hypertension    f/u plus blood work    HPI Parker Saunders is a 52 y.o. male who presents to Primary Care at [facility] for f/u. Last seen 8/27. Had been off meds x 1-2 yrs for HTN, prev on losartan 100 mg qd. Restarted losartan 100 mg qd. Here for fasting labs. BMP normal at last OV. Did have hyperlipidemia when checked in 2017. Pt is fasting at this time.  HTN Lab Results  Component Value Date   CREATININE 0.88 10/03/2017   BP Readings from Last 3 Encounters:  10/17/17 (!) 144/98  10/03/17 (!) 144/92  02/05/16 125/65  Pt has been on meds x 2 wks. He did have a mild HA this morning but denies changes in HA. Home BP readings 140s/high 80s-90. Denies side-effects. States he has been walking 1-2 miles for ~1 hr every morning. Denies alcohol or tobacco use.  Patient Active Problem List   Diagnosis Date Noted  . Spinal stenosis of lumbar region 02/04/2016  . Unspecified essential hypertension 01/25/2013   Past Medical History:  Diagnosis Date  . Hypertension   . Renal stone   . Rotator cuff disorder    Past Surgical History:  Procedure Laterality Date  . BACK SURGERY  2005  . LUMBAR LAMINECTOMY/DECOMPRESSION MICRODISCECTOMY Left 02/04/2016   Procedure: REDO MICRO LUMBAR DECOMPRESSION L4-5, L5-S1 ON THE LEFT  2 LEVELS;  Surgeon: [name], MD;  Location: [facility];  Service: Orthopedics;  Laterality: Left;  . SHOULDER SURGERY Right 2014  . SPINE SURGERY  02/07/2005   [facility]   No Known Allergies Prior to Admission medications   Medication Sig Start Date End Date Taking? Authorizing Provider  losartan  (COZAAR) 100 MG tablet Take 1 tablet (100 mg total) by mouth daily. 10/03/17   Shade Flood, MD   Social History   Socioeconomic History  . Marital status: Married    Spouse name: Not on file  . Number of children: 2  . Years of education: Not on file  . Highest education level: Not on file  Occupational History  . Occupation: unemployed    Comment: since 2015  Social Needs  . Financial resource strain: Not on file  . Food insecurity:    Worry: Not on file    Inability: Not on file  . Transportation needs:    Medical: Not on file    Non-medical: Not on file  Tobacco Use  . Smoking status: Never Smoker  . Smokeless tobacco: Never Used  Substance and Sexual Activity  . Alcohol use: No  . Drug use: No  . Sexual activity: Yes  Lifestyle  . Physical activity:    Days per week: Not on file    Minutes per session: Not on file  . Stress: Not on file  Relationships  . Social connections:    Talks on phone: Not on file    Gets together: Not on file    Attends religious service: Not on file    Active member of club or organization: Not on file    Attends meetings of clubs or organizations: Not on file  Relationship status: Not on file  . Intimate partner violence:    Fear of current or ex partner: Not on file    Emotionally abused: Not on file    Physically abused: Not on file    Forced sexual activity: Not on file  Other Topics Concern  . Not on file  Social History Narrative   Marital status: married      Children: 2 daughters      Lives: with wife, 2 daughters      Employment: unemployed after termination in 2015     Tobacco: none      Alcohol: none      Exercise:  Walking 4-5 miles daily   Review of Systems  Constitutional: Negative for fatigue and unexpected weight change.  Eyes: Negative for visual disturbance.  Respiratory: Negative for cough, chest tightness and shortness of breath.   Cardiovascular: Negative for chest pain, palpitations and leg  swelling.  Gastrointestinal: Negative for abdominal pain and blood in stool.  Neurological: Positive for headaches (mild, intermittent). Negative for dizziness and light-headedness.      Objective:   Physical Exam  Constitutional: He is oriented to person, place, and time. He appears well-developed and well-nourished.  HENT:  Head: Normocephalic and atraumatic.  Eyes: Pupils are equal, round, and reactive to light. EOM are normal.  Neck: No JVD present. Carotid bruit is not present.  Cardiovascular: Normal rate, regular rhythm and normal heart sounds.  No murmur heard. Pulmonary/Chest: Effort normal and breath sounds normal. He has no rales.  Musculoskeletal: He exhibits no edema.  Neurological: He is alert and oriented to person, place, and time.  Skin: Skin is warm and dry.  Psychiatric: He has a normal mood and affect.  Vitals reviewed.  Vitals:   10/17/17 1046 10/17/17 1047  BP: (!) 147/89 (!) 144/98  Pulse: 83   Temp: (!) 97.5 F (36.4 C) 97.8 F (36.6 C)  TempSrc: Oral Oral  SpO2: 97%   Weight: 298 lb (135.2 kg)   Height: 6\' 3"  (1.905 m)       Assessment & Plan:    Parker Saunders is a 52 y.o. male Essential hypertension - Plan: losartan-hydrochlorothiazide (HYZAAR) 100-12.5 MG tablet, Comprehensive metabolic panel  Hyperlipidemia, unspecified hyperlipidemia type - Plan: Comprehensive metabolic panel, Lipid panel   -Still elevated, add HCTZ 12.5 mg, continue losartan 100 mg.  Potential side effects discussed.   -Repeat creatinine today, along with lipid screening as elevated previously.  Recheck 3 months  Meds ordered this encounter  Medications  . losartan-hydrochlorothiazide (HYZAAR) 100-12.5 MG tablet    Sig: Take 1 tablet by mouth daily.    Dispense:  90 tablet    Refill:  1   Patient Instructions     Blood pressure still running a little bit high.  New medication has a diuretic/water pill.  That should be sufficient for control.  I will let you know  your lab results within the next 2 weeks, but please follow-up in 3 months for recheck blood pressure.  Thank you for coming in today.   Hypertension Hypertension, commonly called high blood pressure, is when the force of blood pumping through the arteries is too strong. The arteries are the blood vessels that carry blood from the heart throughout the body. Hypertension forces the heart to work harder to pump blood and may cause arteries to become narrow or stiff. Having untreated or uncontrolled hypertension can cause heart attacks, strokes, kidney disease, and other problems. A blood pressure reading  consists of a higher number over a lower number. Ideally, your blood pressure should be below 120/80. The first ("top") number is called the systolic pressure. It is a measure of the pressure in your arteries as your heart beats. The second ("bottom") number is called the diastolic pressure. It is a measure of the pressure in your arteries as the heart relaxes. What are the causes? The cause of this condition is not known. What increases the risk? Some risk factors for high blood pressure are under your control. Others are not. Factors you can change  Smoking.  Having type 2 diabetes mellitus, high cholesterol, or both.  Not getting enough exercise or physical activity.  Being overweight.  Having too much fat, sugar, calories, or salt (sodium) in your diet.  Drinking too much alcohol. Factors that are difficult or impossible to change  Having chronic kidney disease.  Having a family history of high blood pressure.  Age. Risk increases with age.  Race. You may be at higher risk if you are African-American.  Gender. Men are at higher risk than women before age 81. After age 17, women are at higher risk than men.  Having obstructive sleep apnea.  Stress. What are the signs or symptoms? Extremely high blood pressure (hypertensive crisis) may  cause:  Headache.  Anxiety.  Shortness of breath.  Nosebleed.  Nausea and vomiting.  Severe chest pain.  Jerky movements you cannot control (seizures).  How is this diagnosed? This condition is diagnosed by measuring your blood pressure while you are seated, with your arm resting on a surface. The cuff of the blood pressure monitor will be placed directly against the skin of your upper arm at the level of your heart. It should be measured at least twice using the same arm. Certain conditions can cause a difference in blood pressure between your right and left arms. Certain factors can cause blood pressure readings to be lower or higher than normal (elevated) for a short period of time:  When your blood pressure is higher when you are in a health care provider's office than when you are at home, this is called white coat hypertension. Most people with this condition do not need medicines.  When your blood pressure is higher at home than when you are in a health care provider's office, this is called masked hypertension. Most people with this condition may need medicines to control blood pressure.  If you have a high blood pressure reading during one visit or you have normal blood pressure with other risk factors:  You may be asked to return on a different day to have your blood pressure checked again.  You may be asked to monitor your blood pressure at home for 1 week or longer.  If you are diagnosed with hypertension, you may have other blood or imaging tests to help your health care provider understand your overall risk for other conditions. How is this treated? This condition is treated by making healthy lifestyle changes, such as eating healthy foods, exercising more, and reducing your alcohol intake. Your health care provider may prescribe medicine if lifestyle changes are not enough to get your blood pressure under control, and if:  Your systolic blood pressure is above  130.  Your diastolic blood pressure is above 80.  Your personal target blood pressure may vary depending on your medical conditions, your age, and other factors. Follow these instructions at home: Eating and drinking  Eat a diet that is high in fiber  and potassium, and low in sodium, added sugar, and fat. An example eating plan is called the DASH (Dietary Approaches to Stop Hypertension) diet. To eat this way: ? Eat plenty of fresh fruits and vegetables. Try to fill half of your plate at each meal with fruits and vegetables. ? Eat whole grains, such as whole wheat pasta, brown rice, or whole grain bread. Fill about one quarter of your plate with whole grains. ? Eat or drink low-fat dairy products, such as skim milk or low-fat yogurt. ? Avoid fatty cuts of meat, processed or cured meats, and poultry with skin. Fill about one quarter of your plate with lean proteins, such as fish, chicken without skin, beans, eggs, and tofu. ? Avoid premade and processed foods. These tend to be higher in sodium, added sugar, and fat.  Reduce your daily sodium intake. Most people with hypertension should eat less than 1,500 mg of sodium a day.  Limit alcohol intake to no more than 1 drink a day for nonpregnant women and 2 drinks a day for men. One drink equals 12 oz of beer, 5 oz of wine, or 1 oz of hard liquor. Lifestyle  Work with your health care provider to maintain a healthy body weight or to lose weight. Ask what an ideal weight is for you.  Get at least 30 minutes of exercise that causes your heart to beat faster (aerobic exercise) most days of the week. Activities may include walking, swimming, or biking.  Include exercise to strengthen your muscles (resistance exercise), such as pilates or lifting weights, as part of your weekly exercise routine. Try to do these types of exercises for 30 minutes at least 3 days a week.  Do not use any products that contain nicotine or tobacco, such as cigarettes and  e-cigarettes. If you need help quitting, ask your health care provider.  Monitor your blood pressure at home as told by your health care provider.  Keep all follow-up visits as told by your health care provider. This is important. Medicines  Take over-the-counter and prescription medicines only as told by your health care provider. Follow directions carefully. Blood pressure medicines must be taken as prescribed.  Do not skip doses of blood pressure medicine. Doing this puts you at risk for problems and can make the medicine less effective.  Ask your health care provider about side effects or reactions to medicines that you should watch for. Contact a health care provider if:  You think you are having a reaction to a medicine you are taking.  You have headaches that keep coming back (recurring).  You feel dizzy.  You have swelling in your ankles.  You have trouble with your vision. Get help right away if:  You develop a severe headache or confusion.  You have unusual weakness or numbness.  You feel faint.  You have severe pain in your chest or abdomen.  You vomit repeatedly.  You have trouble breathing. Summary  Hypertension is when the force of blood pumping through your arteries is too strong. If this condition is not controlled, it may put you at risk for serious complications.  Your personal target blood pressure may vary depending on your medical conditions, your age, and other factors. For most people, a normal blood pressure is less than 120/80.  Hypertension is treated with lifestyle changes, medicines, or a combination of both. Lifestyle changes include weight loss, eating a healthy, low-sodium diet, exercising more, and limiting alcohol. This information is not intended to  replace advice given to you by your health care provider. Make sure you discuss any questions you have with your health care provider. Document Released: 01/24/2005 Document Revised: 12/23/2015  Document Reviewed: 12/23/2015 Elsevier Interactive Patient Education  [employer].   If you have lab work done today you will be contacted with your lab results within the next 2 weeks.  If you have not heard from [place] then please contact us. The fastest way to get your results is to register for My Chart.   IF you received an x-ray today, you will receive an invoice from [facility]. Please contact [facility] at [phone] with questions or concerns regarding your invoice.   IF you received labwork today, you will receive an invoice from [place]. Please contact [facility] at [phone] with questions or concerns regarding your invoice.   Our billing staff will not be able to assist you with questions regarding bills from these companies.  You will be contacted with the lab results as soon as they are available. The fastest way to get your results is to activate your My Chart account. Instructions are located on the last page of this paperwork. If you have not heard from [place] regarding the results in 2 weeks, please contact this office.       I personally performed the services described in this documentation, which was scribed in my presence. The recorded information has been reviewed and considered for accuracy and completeness, addended by me as needed, and agree with information above.  Signed,   Meredith Staggers, MD Primary Care at [facility].  10/17/17 11:25 AM

## 2017-10-17 NOTE — Patient Instructions (Addendum)
Blood pressure still running a little bit high.  New medication has a diuretic/water pill.  That should be sufficient for control.  I will let you know your lab results within the next 2 weeks, but please follow-up in 3 months for recheck blood pressure.  Thank you for coming in today.   Hypertension Hypertension, commonly called high blood pressure, is when the force of blood pumping through the arteries is too strong. The arteries are the blood vessels that carry blood from the heart throughout the body. Hypertension forces the heart to work harder to pump blood and may cause arteries to become narrow or stiff. Having untreated or uncontrolled hypertension can cause heart attacks, strokes, kidney disease, and other problems. A blood pressure reading consists of a higher number over a lower number. Ideally, your blood pressure should be below 120/80. The first ("top") number is called the systolic pressure. It is a measure of the pressure in your arteries as your heart beats. The second ("bottom") number is called the diastolic pressure. It is a measure of the pressure in your arteries as the heart relaxes. What are the causes? The cause of this condition is not known. What increases the risk? Some risk factors for high blood pressure are under your control. Others are not. Factors you can change  Smoking.  Having type 2 diabetes mellitus, high cholesterol, or both.  Not getting enough exercise or physical activity.  Being overweight.  Having too much fat, sugar, calories, or salt (sodium) in your diet.  Drinking too much alcohol. Factors that are difficult or impossible to change  Having chronic kidney disease.  Having a family history of high blood pressure.  Age. Risk increases with age.  Race. You may be at higher risk if you are African-American.  Gender. Men are at higher risk than women before age 69. After age 67, women are at higher risk than men.  Having obstructive  sleep apnea.  Stress. What are the signs or symptoms? Extremely high blood pressure (hypertensive crisis) may cause:  Headache.  Anxiety.  Shortness of breath.  Nosebleed.  Nausea and vomiting.  Severe chest pain.  Jerky movements you cannot control (seizures).  How is this diagnosed? This condition is diagnosed by measuring your blood pressure while you are seated, with your arm resting on a surface. The cuff of the blood pressure monitor will be placed directly against the skin of your upper arm at the level of your heart. It should be measured at least twice using the same arm. Certain conditions can cause a difference in blood pressure between your right and left arms. Certain factors can cause blood pressure readings to be lower or higher than normal (elevated) for a short period of time:  When your blood pressure is higher when you are in a health care provider's office than when you are at home, this is called white coat hypertension. Most people with this condition do not need medicines.  When your blood pressure is higher at home than when you are in a health care provider's office, this is called masked hypertension. Most people with this condition may need medicines to control blood pressure.  If you have a high blood pressure reading during one visit or you have normal blood pressure with other risk factors:  You may be asked to return on a different day to have your blood pressure checked again.  You may be asked to monitor your blood pressure at home for 1 week  or longer.  If you are diagnosed with hypertension, you may have other blood or imaging tests to help your health care provider understand your overall risk for other conditions. How is this treated? This condition is treated by making healthy lifestyle changes, such as eating healthy foods, exercising more, and reducing your alcohol intake. Your health care provider may prescribe medicine if lifestyle changes  are not enough to get your blood pressure under control, and if:  Your systolic blood pressure is above 130.  Your diastolic blood pressure is above 80.  Your personal target blood pressure may vary depending on your medical conditions, your age, and other factors. Follow these instructions at home: Eating and drinking  Eat a diet that is high in fiber and potassium, and low in sodium, added sugar, and fat. An example eating plan is called the DASH (Dietary Approaches to Stop Hypertension) diet. To eat this way: ? Eat plenty of fresh fruits and vegetables. Try to fill half of your plate at each meal with fruits and vegetables. ? Eat whole grains, such as whole wheat pasta, brown rice, or whole grain bread. Fill about one quarter of your plate with whole grains. ? Eat or drink low-fat dairy products, such as skim milk or low-fat yogurt. ? Avoid fatty cuts of meat, processed or cured meats, and poultry with skin. Fill about one quarter of your plate with lean proteins, such as fish, chicken without skin, beans, eggs, and tofu. ? Avoid premade and processed foods. These tend to be higher in sodium, added sugar, and fat.  Reduce your daily sodium intake. Most people with hypertension should eat less than 1,500 mg of sodium a day.  Limit alcohol intake to no more than 1 drink a day for nonpregnant women and 2 drinks a day for men. One drink equals 12 oz of beer, 5 oz of wine, or 1 oz of hard liquor. Lifestyle  Work with your health care provider to maintain a healthy body weight or to lose weight. Ask what an ideal weight is for you.  Get at least 30 minutes of exercise that causes your heart to beat faster (aerobic exercise) most days of the week. Activities may include walking, swimming, or biking.  Include exercise to strengthen your muscles (resistance exercise), such as pilates or lifting weights, as part of your weekly exercise routine. Try to do these types of exercises for 30 minutes at  least 3 days a week.  Do not use any products that contain nicotine or tobacco, such as cigarettes and e-cigarettes. If you need help quitting, ask your health care provider.  Monitor your blood pressure at home as told by your health care provider.  Keep all follow-up visits as told by your health care provider. This is important. Medicines  Take over-the-counter and prescription medicines only as told by your health care provider. Follow directions carefully. Blood pressure medicines must be taken as prescribed.  Do not skip doses of blood pressure medicine. Doing this puts you at risk for problems and can make the medicine less effective.  Ask your health care provider about side effects or reactions to medicines that you should watch for. Contact a health care provider if:  You think you are having a reaction to a medicine you are taking.  You have headaches that keep coming back (recurring).  You feel dizzy.  You have swelling in your ankles.  You have trouble with your vision. Get help right away if:  You develop  a severe headache or confusion.  You have unusual weakness or numbness.  You feel faint.  You have severe pain in your chest or abdomen.  You vomit repeatedly.  You have trouble breathing. Summary  Hypertension is when the force of blood pumping through your arteries is too strong. If this condition is not controlled, it may put you at risk for serious complications.  Your personal target blood pressure may vary depending on your medical conditions, your age, and other factors. For most people, a normal blood pressure is less than 120/80.  Hypertension is treated with lifestyle changes, medicines, or a combination of both. Lifestyle changes include weight loss, eating a healthy, low-sodium diet, exercising more, and limiting alcohol. This information is not intended to replace advice given to you by your health care provider. Make sure you discuss any  questions you have with your health care provider. Document Released: 01/24/2005 Document Revised: 12/23/2015 Document Reviewed: 12/23/2015 Elsevier Interactive Patient Education  [employer].   If you have lab work done today you will be contacted with your lab results within the next 2 weeks.  If you have not heard from [place] then please contact us. The fastest way to get your results is to register for My Chart.   IF you received an x-ray today, you will receive an invoice from [facility]. Please contact [facility] at [phone] with questions or concerns regarding your invoice.   IF you received labwork today, you will receive an invoice from [place]. Please contact [facility] at [phone] with questions or concerns regarding your invoice.   Our billing staff will not be able to assist you with questions regarding bills from these companies.  You will be contacted with the lab results as soon as they are available. The fastest way to get your results is to activate your My Chart account. Instructions are located on the last page of this paperwork. If you have not heard from [place] regarding the results in 2 weeks, please contact this office.

## 2017-10-21 IMAGING — DX DG SPINE 1V PORT
1 series · 1 of 1 positions shown · non-contrast
Comparison: 01/25/2016

CLINICAL DATA: Intraoperative localization

EXAM:
PORTABLE SPINE - 1 VIEW

[l-spine lat]
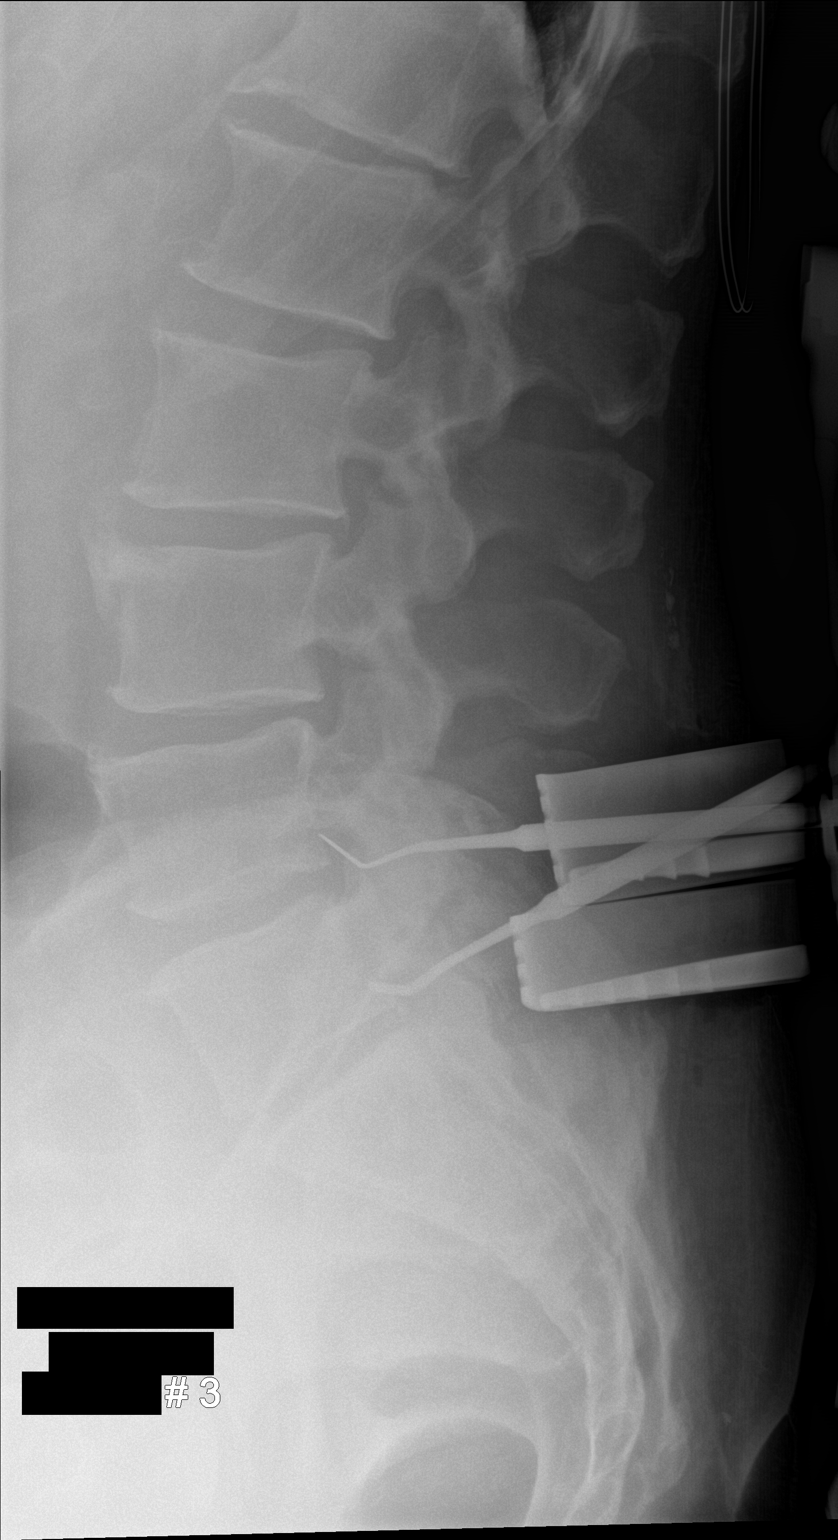

[1 of 1 positions shown; findings below may reference images not displayed]

FINDINGS: Posterior surgical instruments are directed at the L4-5 and L5-S1
levels.
IMPRESSION: Intraoperative localization as above.

## 2017-10-21 IMAGING — DX DG SPINE 1V PORT
1 series · 1 of 1 positions shown · non-contrast
Comparison: Radiographs of same day.

CLINICAL DATA: Lumbar decompression of L4-5 and L5-S1.

EXAM:
PORTABLE SPINE - 1 VIEW

[l-spine lat]
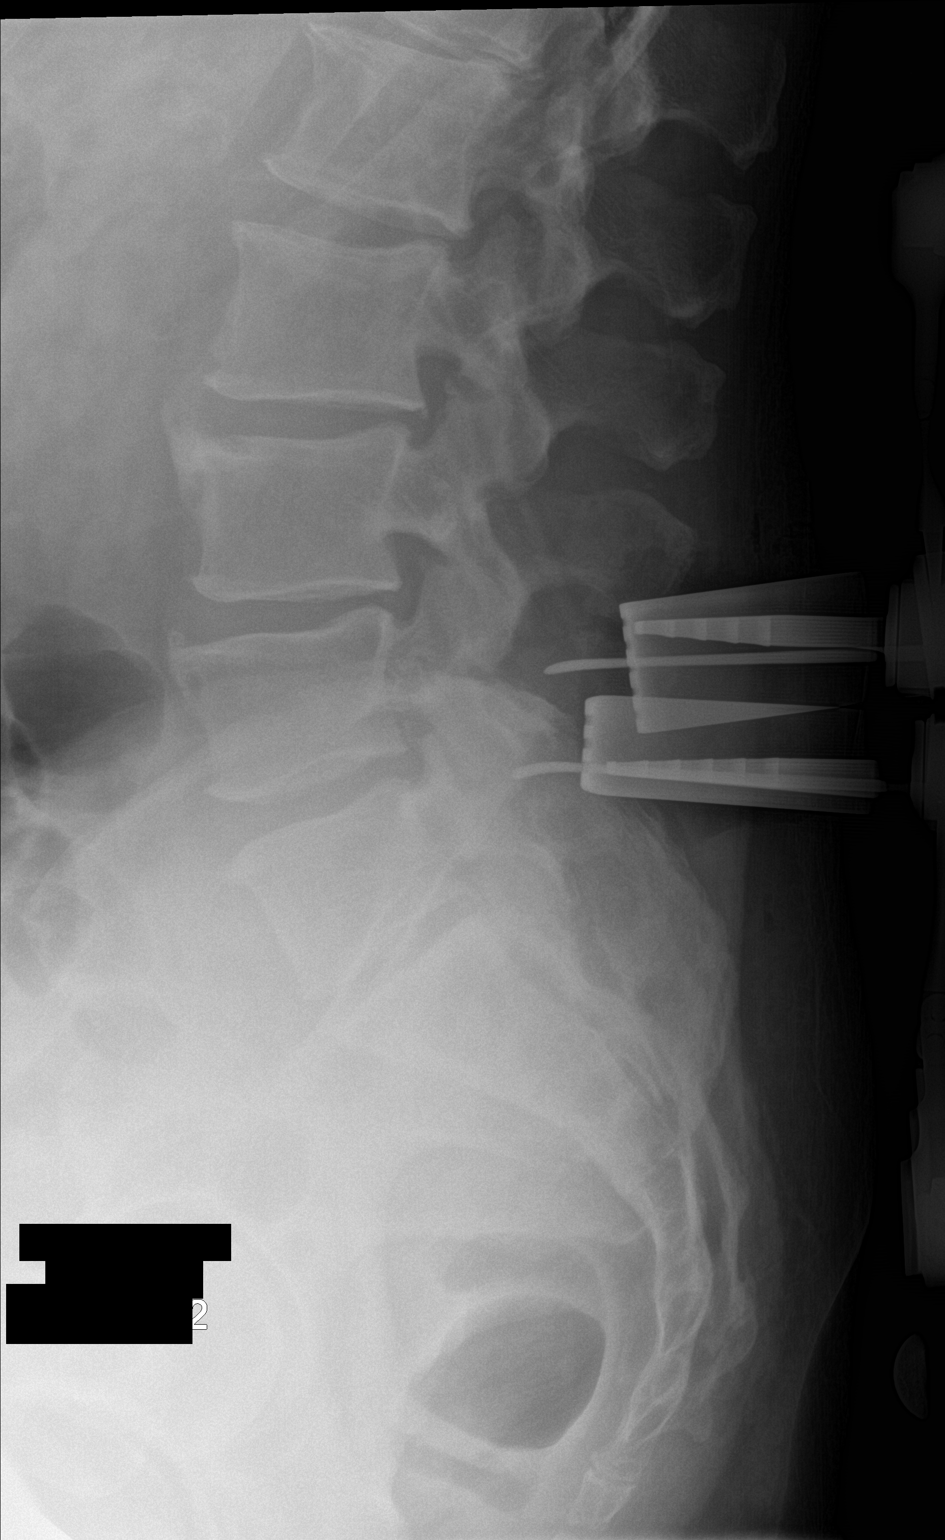

[1 of 1 positions shown; findings below may reference images not displayed]

FINDINGS: Single intraoperative cross-table lateral projection of the lumbar
spine demonstrates surgical probes directed toward the posterior
elements of L4 and L5. Surgical retractors are noted in the
posterior soft tissues is well.
IMPRESSION: Surgical localization as described above.

## 2017-10-30 ENCOUNTER — Telehealth: Payer: Self-pay | Admitting: Family Medicine

## 2017-10-30 DIAGNOSIS — E785 Hyperlipidemia, unspecified: Secondary | ICD-10-CM

## 2017-10-30 NOTE — Telephone Encounter (Signed)
Copied from CRM [id number]. Topic: General - Other >> Oct 30, 2017 11:43 AM Maye Hidesoley, Sarah wrote: Patient received letter regarding trying a new statin Rx, patient would like to proceed with new Rx. Patient states letter also states to follow up in 6 weeks for labs (requesting lab orders) please send new Rx to  [id number] - [place] - [address] [facility] [address] [phone] (Phone) [phone] (Fax)

## 2017-11-01 MED ORDER — ATORVASTATIN CALCIUM 10 MG PO TABS: 10.0000 mg | ORAL_TABLET | Freq: Every day | ORAL | 0 refills | Status: DC

## 2017-11-01 NOTE — Telephone Encounter (Signed)
Lipitor 10 mg daily ordered, lab order placed.  Recheck 6 weeks for labs

## 2017-11-01 NOTE — Addendum Note (Signed)
Addended by: Neva Seat, Amrita Radu R on: 11/01/2017 10:00 AM   Modules accepted: Orders

## 2017-12-15 ENCOUNTER — Encounter: Payer: Self-pay | Admitting: Family Medicine

## 2017-12-15 ENCOUNTER — Ambulatory Visit (INDEPENDENT_AMBULATORY_CARE_PROVIDER_SITE_OTHER): Payer: BLUE CROSS/BLUE SHIELD | Admitting: Family Medicine

## 2017-12-15 ENCOUNTER — Other Ambulatory Visit: Payer: Self-pay

## 2017-12-15 VITALS — BP 126/70 | HR 94 | Temp 97.5°F | Ht 75.0 in | Wt 297.8 lb

## 2017-12-15 DIAGNOSIS — I10 Essential (primary) hypertension: Secondary | ICD-10-CM | POA: Diagnosis not present

## 2017-12-15 DIAGNOSIS — E785 Hyperlipidemia, unspecified: Secondary | ICD-10-CM | POA: Diagnosis not present

## 2017-12-15 DIAGNOSIS — Z1211 Encounter for screening for malignant neoplasm of colon: Secondary | ICD-10-CM | POA: Diagnosis not present

## 2017-12-15 MED ORDER — ATORVASTATIN CALCIUM 10 MG PO TABS: 10.0000 mg | ORAL_TABLET | Freq: Every day | ORAL | 1 refills | Status: DC

## 2017-12-15 MED ORDER — LOSARTAN POTASSIUM-HCTZ 100-12.5 MG PO TABS: 1.0000 | ORAL_TABLET | Freq: Every day | ORAL | 1 refills | Status: DC

## 2017-12-15 NOTE — Progress Notes (Signed)
Subjective:  By signing my name below, I, Parker Saunders, attest that this documentation has been prepared under the direction and in the presence of Shade Flood, MD Electronically Signed: Charline Bills, ED Scribe 12/15/2017 at 2:09 PM.   Patient ID: Parker Saunders, male    DOB: [birth year], 52 y.o.   MRN: [id number]  Chief Complaint  Patient presents with  . Hyperlipidemia    not fasting    HPI Parker Saunders is a 52 y.o. male who presents to Primary Care at [facility] for f/u. Pt is not fasting today.  HTN Lab Results  Component Value Date   CREATININE 1.10 10/17/2017   BP Readings from Last 3 Encounters:  12/15/17 126/70  10/17/17 (!) 144/98  10/03/17 (!) 144/92  Continued losartan 100, added HCTZ 12.5. - Denies any side-effects.  Hyperlipidemia Lab Results  Component Value Date   CHOL 216 (H) 10/17/2017   HDL 31 (L) 10/17/2017   LDLCALC 142 (H) 10/17/2017   TRIG 217 (H) 10/17/2017   CHOLHDL 7.0 (H) 10/17/2017   Lab Results  Component Value Date   ALT 30 10/17/2017   AST 24 10/17/2017   ALKPHOS 81 10/17/2017   BILITOT 0.5 10/17/2017   The 10-year ASCVD risk score Denman George DC Jr., et al., 2013) is: 9.1%   Values used to calculate the score:     Age: 80 years     Sex: Male     Is Non-Hispanic African American: No     Diabetic: No     Tobacco smoker: No     Systolic Blood Pressure: 126 mmHg     Is BP treated: Yes     HDL Cholesterol: 31 mg/dL     Total Cholesterol: 216 mg/dL Started atorvastatin 10 mg on 9/25. Here for rpt testing. - Denies myalgias, arthralgias, any side-effects. Reports walking daily for exercise.  Patient Active Problem List   Diagnosis Date Noted  . Spinal stenosis of lumbar region 02/04/2016  . Unspecified essential hypertension 01/25/2013   Past Medical History:  Diagnosis Date  . Hypertension   . Renal stone   . Rotator cuff disorder    Past Surgical History:  Procedure Laterality Date  . BACK SURGERY  2005  . LUMBAR  LAMINECTOMY/DECOMPRESSION MICRODISCECTOMY Left 02/04/2016   Procedure: REDO MICRO LUMBAR DECOMPRESSION L4-5, L5-S1 ON THE LEFT  2 LEVELS;  Surgeon: [name], MD;  Location: [facility];  Service: Orthopedics;  Laterality: Left;  . SHOULDER SURGERY Right 2014  . SPINE SURGERY  02/07/2005   [facility]   No Known Allergies Prior to Admission medications   Medication Sig Start Date End Date Taking? Authorizing Provider  atorvastatin (LIPITOR) 10 MG tablet Take 1 tablet (10 mg total) by mouth daily. 11/01/17   Shade Flood, MD  losartan (COZAAR) 100 MG tablet Take 1 tablet (100 mg total) by mouth daily. 10/03/17   Shade Flood, MD  losartan-hydrochlorothiazide (HYZAAR) 100-12.5 MG tablet Take 1 tablet by mouth daily. 10/17/17   Shade Flood, MD   Social History   Socioeconomic History  . Marital status: Married    Spouse name: Not on file  . Number of children: 2  . Years of education: Not on file  . Highest education level: Not on file  Occupational History  . Occupation: unemployed    Comment: since 2015  Social Needs  . Financial resource strain: Not on file  . Food insecurity:    Worry: Not on file    Inability:  Not on file  . Transportation needs:    Medical: Not on file    Non-medical: Not on file  Tobacco Use  . Smoking status: Never Smoker  . Smokeless tobacco: Never Used  Substance and Sexual Activity  . Alcohol use: No  . Drug use: No  . Sexual activity: Yes  Lifestyle  . Physical activity:    Days per week: Not on file    Minutes per session: Not on file  . Stress: Not on file  Relationships  . Social connections:    Talks on phone: Not on file    Gets together: Not on file    Attends religious service: Not on file    Active member of club or organization: Not on file    Attends meetings of clubs or organizations: Not on file    Relationship status: Not on file  . Intimate partner violence:    Fear of current or ex partner: Not on file     Emotionally abused: Not on file    Physically abused: Not on file    Forced sexual activity: Not on file  Other Topics Concern  . Not on file  Social History Narrative   Marital status: married      Children: 2 daughters      Lives: with wife, 2 daughters      Employment: unemployed after termination in 2015     Tobacco: none      Alcohol: none      Exercise:  Walking 4-5 miles daily   Review of Systems  Constitutional: Negative for fatigue and unexpected weight change.  Eyes: Negative for visual disturbance.  Respiratory: Negative for cough, chest tightness and shortness of breath.   Cardiovascular: Negative for chest pain, palpitations and leg swelling.  Gastrointestinal: Negative for abdominal pain and blood in stool.  Musculoskeletal: Negative for arthralgias and myalgias.  Neurological: Negative for dizziness, light-headedness and headaches.      Objective:   Physical Exam  Constitutional: He is oriented to person, place, and time. He appears well-developed and well-nourished.  HENT:  Head: Normocephalic and atraumatic.  Eyes: Pupils are equal, round, and reactive to light. EOM are normal.  Neck: No JVD present. Carotid bruit is not present.  Cardiovascular: Normal rate, regular rhythm and normal heart sounds.  No murmur heard. Pulmonary/Chest: Effort normal and breath sounds normal. He has no rales.  Musculoskeletal: He exhibits no edema.  Neurological: He is alert and oriented to person, place, and time.  Skin: Skin is warm and dry.  Psychiatric: He has a normal mood and affect.  Vitals reviewed.   Vitals:   12/15/17 1358 12/15/17 1359  BP: (!) 147/78 126/70  Pulse: 94   Temp: (!) 97.5 F (36.4 C)   TempSrc: Oral   SpO2: 98%   Weight: 297 lb 12.8 oz (135.1 kg)   Height: 6\' 3"  (1.905 m)       Assessment & Plan:   Parker Saunders is a 52 y.o. male Essential hypertension - Plan: Comprehensive metabolic panel, losartan-hydrochlorothiazide (HYZAAR) 100-12.5 MG  tablet  -Improved control on current regimen.  Continue same.  Recheck creatinine with fasting blood work.  Hyperlipidemia, unspecified hyperlipidemia type - Plan: Comprehensive metabolic panel, Lipid panel, atorvastatin (LIPITOR) 10 MG tablet  -Tolerating Lipitor, check fasting lipids and LFTs on upcoming fasting lab visit.  Special screening for malignant neoplasms, colon - Plan: Cologuard  -Options of colonoscopy or Cologuard were discussed with potential risks and benefits of both.  He chose to start with Cologuard.   Meds ordered this encounter  Medications  . atorvastatin (LIPITOR) 10 MG tablet    Sig: Take 1 tablet (10 mg total) by mouth daily.    Dispense:  90 tablet    Refill:  1  . losartan-hydrochlorothiazide (HYZAAR) 100-12.5 MG tablet    Sig: Take 1 tablet by mouth daily.    Dispense:  90 tablet    Refill:  1   Patient Instructions   Return for fasting lab work within the next week if possible.  No med changes for now.  Follow-up as scheduled.   I ordered Cologuard for colon cancer screening.   If you have lab work done today you will be contacted with your lab results within the next 2 weeks.  If you have not heard from [place] then please contact us. The fastest way to get your results is to register for My Chart.   IF you received an x-ray today, you will receive an invoice from [facility]. Please contact [facility] at [phone] with questions or concerns regarding your invoice.   IF you received labwork today, you will receive an invoice from [place]. Please contact [facility] at [phone] with questions or concerns regarding your invoice.   Our billing staff will not be able to assist you with questions regarding bills from these companies.  You will be contacted with the lab results as soon as they are available. The fastest way to get your results is to activate your My Chart account. Instructions are located on the last page of this  paperwork. If you have not heard from [place] regarding the results in 2 weeks, please contact this office.       I personally performed the services described in this documentation, which was scribed in my presence. The recorded information has been reviewed and considered for accuracy and completeness, addended by me as needed, and agree with information above.  Signed,   Meredith Staggers, MD Primary Care at [facility].  12/15/17 2:18 PM

## 2017-12-15 NOTE — Patient Instructions (Addendum)
Return for fasting lab work within the next week if possible.  No med changes for now.  Follow-up as scheduled.   I ordered Cologuard for colon cancer screening.   If you have lab work done today you will be contacted with your lab results within the next 2 weeks.  If you have not heard from [place] then please contact us. The fastest way to get your results is to register for My Chart.   IF you received an x-ray today, you will receive an invoice from [facility]. Please contact [facility] at [phone] with questions or concerns regarding your invoice.   IF you received labwork today, you will receive an invoice from [place]. Please contact [facility] at [phone] with questions or concerns regarding your invoice.   Our billing staff will not be able to assist you with questions regarding bills from these companies.  You will be contacted with the lab results as soon as they are available. The fastest way to get your results is to activate your My Chart account. Instructions are located on the last page of this paperwork. If you have not heard from [place] regarding the results in 2 weeks, please contact this office.

## 2017-12-22 ENCOUNTER — Ambulatory Visit (INDEPENDENT_AMBULATORY_CARE_PROVIDER_SITE_OTHER): Payer: BLUE CROSS/BLUE SHIELD | Admitting: Family Medicine

## 2017-12-22 DIAGNOSIS — E785 Hyperlipidemia, unspecified: Secondary | ICD-10-CM

## 2017-12-22 DIAGNOSIS — I10 Essential (primary) hypertension: Secondary | ICD-10-CM

## 2017-12-22 DIAGNOSIS — Z1211 Encounter for screening for malignant neoplasm of colon: Secondary | ICD-10-CM

## 2017-12-23 LAB — COMPREHENSIVE METABOLIC PANEL
A/G RATIO: 1.9 (ref 1.2–2.2)
ALT: 26 IU/L (ref 0–44)
AST: 18 IU/L (ref 0–40)
Albumin: 4.4 g/dL (ref 3.5–5.5)
Alkaline Phosphatase: 85 IU/L (ref 39–117)
BUN/Creatinine Ratio: 14 (ref 9–20)
BUN: 15 mg/dL (ref 6–24)
Bilirubin Total: 0.5 mg/dL (ref 0.0–1.2)
CALCIUM: 9.2 mg/dL (ref 8.7–10.2)
CO2: 22 mmol/L (ref 20–29)
CREATININE: 1.07 mg/dL (ref 0.76–1.27)
Chloride: 102 mmol/L (ref 96–106)
GFR calc Af Amer: 91 mL/min/{1.73_m2} (ref >59)
GFR, EST NON AFRICAN AMERICAN: 79 mL/min/{1.73_m2} (ref >59)
Globulin, Total: 2.3 g/dL (ref 1.5–4.5)
Glucose: 97 mg/dL (ref 65–99)
POTASSIUM: 4.1 mmol/L (ref 3.5–5.2)
Sodium: 140 mmol/L (ref 134–144)
TOTAL PROTEIN: 6.7 g/dL (ref 6.0–8.5)

## 2017-12-23 LAB — LIPID PANEL
Chol/HDL Ratio: 5 ratio (ref 0.0–5.0)
Cholesterol, Total: 150 mg/dL (ref 100–199)
HDL: 30 mg/dL — ABNORMAL LOW (ref >39)
LDL CALC: 96 mg/dL (ref 0–99)
Triglycerides: 119 mg/dL (ref 0–149)
VLDL Cholesterol Cal: 24 mg/dL (ref 5–40)

## 2017-12-23 LAB — HEPATIC FUNCTION PANEL: Bilirubin, Direct: 0.12 mg/dL (ref 0.00–0.40)

## 2018-01-02 LAB — COLOGUARD: Cologuard: NEGATIVE

## 2018-01-02 NOTE — Progress Notes (Signed)
3.6 °

## 2018-01-02 NOTE — Addendum Note (Signed)
Addended by: Baldwin CrownJOHNSON, SHAQUETTA D on: 01/02/2018 09:11 AM   Modules accepted: Orders

## 2018-01-05 ENCOUNTER — Encounter: Payer: Self-pay | Admitting: Family Medicine

## 2018-01-09 ENCOUNTER — Encounter: Payer: Self-pay | Admitting: *Deleted

## 2018-01-16 ENCOUNTER — Ambulatory Visit: Payer: BLUE CROSS/BLUE SHIELD | Admitting: Family Medicine

## 2018-01-16 ENCOUNTER — Encounter: Payer: Self-pay | Admitting: Family Medicine

## 2018-01-16 ENCOUNTER — Other Ambulatory Visit: Payer: Self-pay

## 2018-01-16 VITALS — BP 130/80 | HR 85 | Temp 98.4°F | Ht 75.0 in | Wt 292.2 lb

## 2018-01-16 DIAGNOSIS — E785 Hyperlipidemia, unspecified: Secondary | ICD-10-CM | POA: Diagnosis not present

## 2018-01-16 DIAGNOSIS — Z23 Encounter for immunization: Secondary | ICD-10-CM

## 2018-01-16 DIAGNOSIS — I10 Essential (primary) hypertension: Secondary | ICD-10-CM | POA: Diagnosis not present

## 2018-01-16 MED ORDER — LOSARTAN POTASSIUM-HCTZ 100-12.5 MG PO TABS: 1.0000 | ORAL_TABLET | Freq: Every day | ORAL | 1 refills | Status: DC

## 2018-01-16 MED ORDER — ATORVASTATIN CALCIUM 10 MG PO TABS: 10.0000 mg | ORAL_TABLET | Freq: Every day | ORAL | 1 refills | Status: DC

## 2018-01-16 NOTE — Progress Notes (Signed)
Subjective:    Patient ID: Parker Saunders, male    DOB: 20-Jun-1965, 52 y.o.   MRN: [id number]  HPI Yahmir Sokolov is a 52 y.o. male Presents today for: Chief Complaint  Patient presents with  . Hypertension    f/u  . Medication Refill    both medications   Hypertension: BP Readings from Last 3 Encounters:  01/16/18 130/80  12/15/17 126/70  10/17/17 (!) 144/98   Lab Results  Component Value Date   CREATININE 1.07 12/22/2017  He takes losartan 100/12.5 mg daily.  Last seen November 8.  Stable readings at that time. No new side effects.  Home readings 120-130/70-80   Hyperlipidemia:  Lab Results  Component Value Date   CHOL 150 12/22/2017   HDL 30 (L) 12/22/2017   LDLCALC 96 12/22/2017   TRIG 119 12/22/2017   CHOLHDL 5.0 12/22/2017   Lab Results  Component Value Date   ALT 26 12/22/2017   AST 18 12/22/2017   ALKPHOS 85 12/22/2017   BILITOT 0.5 12/22/2017  Tolerating Lipitor 10 mg daily at last visit.  Improved from 142-96 from 3 months ago to 3 weeks ago. HDL still low, but walking everyday. Not much fish in diet.   HM: Agrees to Tdap. Declines flu vaccine.  Cologuard negative recently - 01/01/18.   Review of Systems  Constitutional: Negative for fatigue and unexpected weight change.  Eyes: Negative for visual disturbance.  Respiratory: Negative for cough, chest tightness and shortness of breath.   Cardiovascular: Negative for chest pain, palpitations and leg swelling.  Gastrointestinal: Negative for abdominal pain and blood in stool.  Neurological: Negative for dizziness, light-headedness and headaches.   Patient Active Problem List   Diagnosis Date Noted  . Spinal stenosis of lumbar region 02/04/2016  . Unspecified essential hypertension 01/25/2013   Past Medical History:  Diagnosis Date  . Hypertension   . Renal stone   . Rotator cuff disorder    Past Surgical History:  Procedure Laterality Date  . BACK SURGERY  2005  . LUMBAR  LAMINECTOMY/DECOMPRESSION MICRODISCECTOMY Left 02/04/2016   Procedure: REDO MICRO LUMBAR DECOMPRESSION L4-5, L5-S1 ON THE LEFT  2 LEVELS;  Surgeon: [name], MD;  Location: [facility];  Service: Orthopedics;  Laterality: Left;  . SHOULDER SURGERY Right 2014  . SPINE SURGERY  02/07/2005   [facility]   No Known Allergies Prior to Admission medications   Medication Sig Start Date End Date Taking? Authorizing Provider  atorvastatin (LIPITOR) 10 MG tablet Take 1 tablet (10 mg total) by mouth daily. 12/15/17  Yes Shade Flood, MD  losartan-hydrochlorothiazide (HYZAAR) 100-12.5 MG tablet Take 1 tablet by mouth daily. 12/15/17  Yes Shade Flood, MD   Social History   Socioeconomic History  . Marital status: Married    Spouse name: Not on file  . Number of children: 2  . Years of education: Not on file  . Highest education level: Not on file  Occupational History  . Occupation: unemployed    Comment: since 2015  Social Needs  . Financial resource strain: Not on file  . Food insecurity:    Worry: Not on file    Inability: Not on file  . Transportation needs:    Medical: Not on file    Non-medical: Not on file  Tobacco Use  . Smoking status: Never Smoker  . Smokeless tobacco: Never Used  Substance and Sexual Activity  . Alcohol use: No  . Drug use: No  . Sexual activity: Yes  Lifestyle  . Physical activity:    Days per week: Not on file    Minutes per session: Not on file  . Stress: Not on file  Relationships  . Social connections:    Talks on phone: Not on file    Gets together: Not on file    Attends religious service: Not on file    Active member of club or organization: Not on file    Attends meetings of clubs or organizations: Not on file    Relationship status: Not on file  . Intimate partner violence:    Fear of current or ex partner: Not on file    Emotionally abused: Not on file    Physically abused: Not on file    Forced sexual activity: Not on file    Other Topics Concern  . Not on file  Social History Narrative   Marital status: married      Children: 2 daughters      Lives: with wife, 2 daughters      Employment: unemployed after termination in 2015     Tobacco: none      Alcohol: none      Exercise:  Walking 4-5 miles daily       Objective:   Physical Exam  Constitutional: He is oriented to person, place, and time. He appears well-developed and well-nourished.  HENT:  Head: Normocephalic and atraumatic.  Eyes: Pupils are equal, round, and reactive to light. EOM are normal.  Neck: No JVD present. Carotid bruit is not present.  Cardiovascular: Normal rate, regular rhythm and normal heart sounds.  No murmur heard. Pulmonary/Chest: Effort normal and breath sounds normal. He has no rales.  Musculoskeletal: He exhibits no edema.  Neurological: He is alert and oriented to person, place, and time.  Skin: Skin is warm and dry.  Psychiatric: He has a normal mood and affect.  Vitals reviewed.  Vitals:   01/16/18 0857  BP: 130/80  Pulse: 85  Temp: 98.4 F (36.9 C)  TempSrc: Oral  SpO2: 97%  Weight: 292 lb 3.2 oz (132.5 kg)  Height: 6\' 3"  (1.905 m)       Assessment & Plan:   Donevan Biller is a 52 y.o. male Hyperlipidemia, unspecified hyperlipidemia type - Plan: atorvastatin (LIPITOR) 10 MG tablet  -Tolerating current regimen, discussed exercise and fish in diet to help with HDL.  No changes for now, recheck 6 months  Need for Tdap vaccination - Plan: Tdap vaccine greater than or equal to 7yo IM  Essential hypertension - Plan: losartan-hydrochlorothiazide (HYZAAR) 100-12.5 MG tablet  -Stable readings in office at home, continue same dose of losartan HCT.  Health maintenance discussed, Tdap as above, declined flu vaccine.  Recent Cologuard was negative.   Meds ordered this encounter  Medications  . atorvastatin (LIPITOR) 10 MG tablet    Sig: Take 1 tablet (10 mg total) by mouth daily.    Dispense:  90 tablet     Refill:  1  . losartan-hydrochlorothiazide (HYZAAR) 100-12.5 MG tablet    Sig: Take 1 tablet by mouth daily.    Dispense:  90 tablet    Refill:  1   Patient Instructions       If you have lab work done today you will be contacted with your lab results within the next 2 weeks.  If you have not heard from [place] then please contact us. The fastest way to get your results is to register for My Chart.   IF you  received an x-ray today, you will receive an invoice from [facility]. Please contact [facility] at [phone] with questions or concerns regarding your invoice.   IF you received labwork today, you will receive an invoice from [facility]. Please contact [facility] at [phone] with questions or concerns regarding your invoice.   Our billing staff will not be able to assist you with questions regarding bills from these companies.  You will be contacted with the lab results as soon as they are available. The fastest way to get your results is to activate your My Chart account. Instructions are located on the last page of this paperwork. If you have not heard from us regarding the results in 2 weeks, please contact this office.       Signed,   Meredith StaggersJeffrey Anshika Pethtel, MD Primary Care at [facility].  01/16/18 9:54 AM

## 2018-01-16 NOTE — Patient Instructions (Signed)
° ° ° °  If you have lab work done today you will be contacted with your lab results within the next 2 weeks.  If you have not heard from us then please contact us. The fastest way to get your results is to register for My Chart. ° ° °IF you received an x-ray today, you will receive an invoice from [facility]. Please contact  Radiology at [phone] with questions or concerns regarding your invoice.  ° °IF you received labwork today, you will receive an invoice from [facility]. Please contact [facility] at [phone] with questions or concerns regarding your invoice.  ° °Our billing staff will not be able to assist you with questions regarding bills from these companies. ° °You will be contacted with the lab results as soon as they are available. The fastest way to get your results is to activate your My Chart account. Instructions are located on the last page of this paperwork. If you have not heard from us regarding the results in 2 weeks, please contact this office. °  ° ° ° °

## 2018-04-06 ENCOUNTER — Other Ambulatory Visit: Payer: Self-pay | Admitting: Family Medicine

## 2018-04-06 DIAGNOSIS — I10 Essential (primary) hypertension: Secondary | ICD-10-CM

## 2018-07-03 DIAGNOSIS — H524 Presbyopia: Secondary | ICD-10-CM | POA: Diagnosis not present

## 2018-07-03 DIAGNOSIS — Z0100 Encounter for examination of eyes and vision without abnormal findings: Secondary | ICD-10-CM | POA: Diagnosis not present

## 2018-07-18 ENCOUNTER — Encounter: Payer: Self-pay | Admitting: Family Medicine

## 2018-07-18 ENCOUNTER — Ambulatory Visit (INDEPENDENT_AMBULATORY_CARE_PROVIDER_SITE_OTHER): Payer: Medicare HMO | Admitting: Family Medicine

## 2018-07-18 ENCOUNTER — Other Ambulatory Visit: Payer: Self-pay

## 2018-07-18 VITALS — BP 140/79 | HR 91 | Temp 98.6°F | Resp 16 | Wt 298.0 lb

## 2018-07-18 DIAGNOSIS — E669 Obesity, unspecified: Secondary | ICD-10-CM | POA: Diagnosis not present

## 2018-07-18 DIAGNOSIS — Z6837 Body mass index (BMI) 37.0-37.9, adult: Secondary | ICD-10-CM | POA: Diagnosis not present

## 2018-07-18 DIAGNOSIS — I10 Essential (primary) hypertension: Secondary | ICD-10-CM

## 2018-07-18 DIAGNOSIS — L918 Other hypertrophic disorders of the skin: Secondary | ICD-10-CM | POA: Diagnosis not present

## 2018-07-18 DIAGNOSIS — E785 Hyperlipidemia, unspecified: Secondary | ICD-10-CM | POA: Diagnosis not present

## 2018-07-18 MED ORDER — ATORVASTATIN CALCIUM 10 MG PO TABS: 10.0000 mg | ORAL_TABLET | Freq: Every day | ORAL | 1 refills | Status: DC

## 2018-07-18 MED ORDER — LOSARTAN POTASSIUM-HCTZ 100-12.5 MG PO TABS: 1.0000 | ORAL_TABLET | Freq: Every day | ORAL | 1 refills | Status: DC

## 2018-07-18 NOTE — Patient Instructions (Addendum)
If you can adjust portion sizes of food for weight loss, then can remain on same dose of meds for now. If blood pressure remains over 140/90 - will need to adjust meds. Let me know.   Portion control should also help with weight, and can recheck those numbers in the next 3 to 6 months.  Area on thigh appears to be a benign skin tag.  That was not sent for biopsy at this time, but if any return of growth in that area, would want to perform a biopsy.  For wound, okay to apply pressure if it starts to bleed again.  Keep area clean, covered until healing.  Okay to apply Vaseline to area if needed but avoid other ointments or creams.  Return if any redness around the area, new odor or discharge, or  worsening pain.  Return to the clinic or go to the nearest emergency room if any of your symptoms worsen or new symptoms occur.    Skin Tag, Adult  A skin tag (acrochordon) is a soft, extra growth of skin. Most skin tags are flesh-colored and rarely bigger than a pencil eraser. They commonly form near areas where there are folds in the skin, such as the armpit or groin. Skin tags are not dangerous, and they do not spread from person to person (are not contagious). You may have one skin tag or several. Skin tags do not require treatment. However, your health care provider may recommend removal of a skin tag if it:  Gets irritated from clothing.  Bleeds.  Is visible and unsightly. Your health care provider can remove skin tags with a simple surgical procedure or a procedure that involves freezing the skin tag. Follow these instructions at home:  Watch for any changes in your skin tag. A normal skin tag does not require any other special care at home.  Take over-the-counter and prescription medicines only as told by your health care provider.  Keep all follow-up visits as told by your health care provider. This is important. Contact a health care provider if:  You have a skin tag that: ? Becomes  painful. ? Changes color. ? Bleeds. ? Swells.  You develop more skin tags. This information is not intended to replace advice given to you by your health care provider. Make sure you discuss any questions you have with your health care provider. Document Released: 02/08/2015 Document Revised: 09/20/2015 Document Reviewed: 02/08/2015 Elsevier Interactive Patient Education  [employer].      If you have lab work done today you will be contacted with your lab results within the next 2 weeks.  If you have not heard from [place] then please contact us. The fastest way to get your results is to register for My Chart.   IF you received an x-ray today, you will receive an invoice from [facility]. Please contact [facility] at [phone] with questions or concerns regarding your invoice.   IF you received labwork today, you will receive an invoice from [place]. Please contact [facility] at [phone] with questions or concerns regarding your invoice.   Our billing staff will not be able to assist you with questions regarding bills from these companies.  You will be contacted with the lab results as soon as they are available. The fastest way to get your results is to activate your My Chart account. Instructions are located on the last page of this paperwork. If you have not heard from [place] regarding the results in 2  weeks, please contact this office.

## 2018-07-18 NOTE — Progress Notes (Signed)
Subjective:    Patient ID: Parker Saunders, male    DOB: 07-11-1965, 53 y.o.   MRN: [id number]  HPI Parker Saunders is a 53 y.o. male Presents today for: Chief Complaint  Patient presents with  . Hypertension     Patient stated since being on these med for blood pressure his blood pressure has been running fine  . skin tag    Patient stated you have seen skin tag once before and want to see if we can do something about it today. Skin tag on the left inner thigh   Hypertension: BP Readings from Last 3 Encounters:  07/18/18 140/79  01/16/18 130/80  12/15/17 126/70   Lab Results  Component Value Date   CREATININE 1.07 12/22/2017  on losartan hct 100/12.5mg  qd.  Wt Readings from Last 3 Encounters:  07/18/18 298 lb (135.2 kg)  01/16/18 292 lb 3.2 oz (132.5 kg)  12/15/17 297 lb 12.8 oz (135.1 kg)   home readings around 140/70.  Walking most days. Morning and afternoon.  No fast food, sugar containing beverages. Min fried food.    Hyperlipidemia:  Lab Results  Component Value Date   CHOL 150 12/22/2017   HDL 30 (L) 12/22/2017   LDLCALC 96 12/22/2017   TRIG 119 12/22/2017   CHOLHDL 5.0 12/22/2017   Lab Results  Component Value Date   ALT 26 12/22/2017   AST 18 12/22/2017   ALKPHOS 85 12/22/2017   BILITOT 0.5 12/22/2017  lipitor 10mg  QD. No new myalgias or side effects. Fasting today.   Obesity: Body mass index is 37.25 kg/m..  Wt Readings from Last 3 Encounters:  07/18/18 298 lb (135.2 kg)  01/16/18 292 lb 3.2 oz (132.5 kg)  12/15/17 297 lb 12.8 oz (135.1 kg)  plans on portion control as above. Not interested in medical or surgical bariatric eval at this time.   Skin tag: Left inner thigh. Sometimes gets irritated on shorts, no other changes. Same size. No prior treatment.   Patient Active Problem List   Diagnosis Date Noted  . Spinal stenosis of lumbar region 02/04/2016  . Unspecified essential hypertension 01/25/2013   Past Medical History:  Diagnosis  Date  . Hypertension   . Renal stone   . Rotator cuff disorder    Past Surgical History:  Procedure Laterality Date  . BACK SURGERY  2005  . LUMBAR LAMINECTOMY/DECOMPRESSION MICRODISCECTOMY Left 02/04/2016   Procedure: REDO MICRO LUMBAR DECOMPRESSION L4-5, L5-S1 ON THE LEFT  2 LEVELS;  Surgeon: Susa Day, MD;  Location: [facility];  Service: Orthopedics;  Laterality: Left;  . SHOULDER SURGERY Right 2014  . SPINE SURGERY  02/07/2005   [facility]   No Known Allergies Prior to Admission medications   Medication Sig Start Date End Date Taking? Authorizing Provider  atorvastatin (LIPITOR) 10 MG tablet Take 1 tablet (10 mg total) by mouth daily. 01/16/18  Yes Wendie Agreste, MD  losartan-hydrochlorothiazide (HYZAAR) 100-12.5 MG tablet Take 1 tablet by mouth daily. 01/16/18  Yes Wendie Agreste, MD   Social History   Socioeconomic History  . Marital status: Married    Spouse name: Not on file  . Number of children: 2  . Years of education: Not on file  . Highest education level: Not on file  Occupational History  . Occupation: unemployed    Comment: since 2015  Social Needs  . Financial resource strain: Not on file  . Food insecurity:    Worry: Not on file    Inability:  Not on file  . Transportation needs:    Medical: Not on file    Non-medical: Not on file  Tobacco Use  . Smoking status: Never Smoker  . Smokeless tobacco: Never Used  Substance and Sexual Activity  . Alcohol use: No  . Drug use: No  . Sexual activity: Yes  Lifestyle  . Physical activity:    Days per week: Not on file    Minutes per session: Not on file  . Stress: Not on file  Relationships  . Social connections:    Talks on phone: Not on file    Gets together: Not on file    Attends religious service: Not on file    Active member of club or organization: Not on file    Attends meetings of clubs or organizations: Not on file    Relationship status: Not on file  . Intimate partner violence:     Fear of current or ex partner: Not on file    Emotionally abused: Not on file    Physically abused: Not on file    Forced sexual activity: Not on file  Other Topics Concern  . Not on file  Social History Narrative   Marital status: married      Children: 2 daughters      Lives: with wife, 2 daughters      Employment: unemployed after termination in 2015     Tobacco: none      Alcohol: none      Exercise:  Walking 4-5 miles daily     Review of Systems  Constitutional: Negative for fatigue and unexpected weight change.  Eyes: Negative for visual disturbance.  Respiratory: Negative for cough, chest tightness and shortness of breath.   Cardiovascular: Negative for chest pain, palpitations and leg swelling.  Gastrointestinal: Negative for abdominal pain and blood in stool.  Neurological: Negative for dizziness, light-headedness and headaches.       Objective:   Physical Exam Vitals signs reviewed.  Constitutional:      Appearance: He is well-developed. He is obese.  HENT:     Head: Normocephalic and atraumatic.  Eyes:     Pupils: Pupils are equal, round, and reactive to light.  Neck:     Vascular: No carotid bruit or JVD.  Cardiovascular:     Rate and Rhythm: Normal rate and regular rhythm.     Heart sounds: Normal heart sounds. No murmur.  Pulmonary:     Effort: Pulmonary effort is normal.     Breath sounds: Normal breath sounds. No rales.  Skin:    General: Skin is warm and dry.       Neurological:     Mental Status: He is alert and oriented to person, place, and time.    Vitals:   07/18/18 0919  BP: 140/79  Pulse: 91  Resp: 16  Temp: 98.6 F (37 C)  TempSrc: Oral  SpO2: 98%  Weight: 298 lb (135.2 kg)   Risks (including but not limited to bleeding and infection), benefits, and alternatives discussed for left thigh skin tak .  Verbal consent obtained after any questions were answered. Landmarks noted, and marked as needed. Area cleansed with Betadine x3,  ethyl chloride spray for topical anesthesia, followed by alcohol swab. Scissors used to cut base, pressure for hemostasis, then silver nitrate x 1.   No complications. Bandage applied.  RTC precautions discussed and bleeding control discussed. Wound care discussed.      Assessment & Plan:  Parker [name] is a 53 y.o. male Hyperlipidemia, unspecified hyperlipidemia type - Plan: Lipid Panel, atorvastatin (LIPITOR) 10 MG tablet  -Tolerating current regimen, continue same.  Labs pending  -Portion control and diet to help with weight loss.  Continue exercise, recheck labs in 6 months  Essential hypertension - Plan: Comprehensive metabolic panel, losartan-hydrochlorothiazide (HYZAAR) 100-12.5 MG tablet  -Borderline.  Anticipate will improve with some portion control, and weight loss.  No change in Hyzaar dose for now unless persistent elevated readings at home then option of increased HCTZ dose to 25 mg  Class 2 obesity with body mass index (BMI) of 37.0 to 37.9 in adult, unspecified obesity type, unspecified whether serious comorbidity present  -Options discussed for medical or surgical bariatric evaluation/treatment. He will adjust portions, continue exercise, repeat evaluation in 3 to 6 months  Skin tag  -Removed as above without complications.  Aftercare discussed with RTC precautions  Meds ordered this encounter  Medications  . losartan-hydrochlorothiazide (HYZAAR) 100-12.5 MG tablet    Sig: Take 1 tablet by mouth daily.    Dispense:  90 tablet    Refill:  1  . atorvastatin (LIPITOR) 10 MG tablet    Sig: Take 1 tablet (10 mg total) by mouth daily.    Dispense:  90 tablet    Refill:  1   Patient Instructions   If you can adjust portion sizes of food for weight loss, then can remain on same dose of meds for now. If blood pressure remains over 140/90 - will need to adjust meds. Let me know.   Portion control should also help with weight, and can recheck those numbers in the next 3  to 6 months.  Area on thigh appears to be a benign skin tag.  That was not sent for biopsy at this time, but if any return of growth in that area, would want to perform a biopsy.  For wound, okay to apply pressure if it starts to bleed again.  Keep area clean, covered until healing.  Okay to apply Vaseline to area if needed but avoid other ointments or creams.  Return if any redness around the area, new odor or discharge, or  worsening pain.  Return to the clinic or go to the nearest emergency room if any of your symptoms worsen or new symptoms occur.    Skin Tag, Adult  A skin tag (acrochordon) is a soft, extra growth of skin. Most skin tags are flesh-colored and rarely bigger than a pencil eraser. They commonly form near areas where there are folds in the skin, such as the armpit or groin. Skin tags are not dangerous, and they do not spread from person to person (are not contagious). You may have one skin tag or several. Skin tags do not require treatment. However, your health care provider may recommend removal of a skin tag if it:  Gets irritated from clothing.  Bleeds.  Is visible and unsightly. Your health care provider can remove skin tags with a simple surgical procedure or a procedure that involves freezing the skin tag. Follow these instructions at home:  Watch for any changes in your skin tag. A normal skin tag does not require any other special care at home.  Take over-the-counter and prescription medicines only as told by your health care provider.  Keep all follow-up visits as told by your health care provider. This is important. Contact a health care provider if:  You have a skin tag that: ? Becomes painful. ? Changes  color. ? Bleeds. ? Swells.  You develop more skin tags. This information is not intended to replace advice given to you by your health care provider. Make sure you discuss any questions you have with your health care provider. Document Released:  02/08/2015 Document Revised: 09/20/2015 Document Reviewed: 02/08/2015 Elsevier Interactive Patient Education  [employer]2019 Elsevier Inc.      If you have lab work done today you will be contacted with your lab results within the next 2 weeks.  If you have not heard from us then please contact us. The fastest way to get your results is to register for My Chart.   IF you received an x-ray today, you will receive an invoice from [facility]. Please contact [facility] at [phone] with questions or concerns regarding your invoice.   IF you received labwork today, you will receive an invoice from [facility]. Please contact [facility] at [phone] with questions or concerns regarding your invoice.   Our billing staff will not be able to assist you with questions regarding bills from these companies.  You will be contacted with the lab results as soon as they are available. The fastest way to get your results is to activate your My Chart account. Instructions are located on the last page of this paperwork. If you have not heard from us regarding the results in 2 weeks, please contact this office.       Signed,   Meredith StaggersJeffrey Arif Amendola, MD [facility].  07/19/18 12:12 PM

## 2018-07-19 LAB — COMPREHENSIVE METABOLIC PANEL
ALT: 28 IU/L (ref 0–44)
AST: 22 IU/L (ref 0–40)
Albumin/Globulin Ratio: 1.6 (ref 1.2–2.2)
Albumin: 4.3 g/dL (ref 3.8–4.9)
Alkaline Phosphatase: 83 IU/L (ref 39–117)
BUN/Creatinine Ratio: 14 (ref 9–20)
BUN: 15 mg/dL (ref 6–24)
Bilirubin Total: 0.4 mg/dL (ref 0.0–1.2)
CO2: 21 mmol/L (ref 20–29)
Calcium: 9.2 mg/dL (ref 8.7–10.2)
Chloride: 103 mmol/L (ref 96–106)
Creatinine, Ser: 1.09 mg/dL (ref 0.76–1.27)
GFR calc Af Amer: 88 mL/min/{1.73_m2} (ref >59)
GFR calc non Af Amer: 77 mL/min/{1.73_m2} (ref >59)
Globulin, Total: 2.7 g/dL (ref 1.5–4.5)
Glucose: 89 mg/dL (ref 65–99)
Potassium: 4.5 mmol/L (ref 3.5–5.2)
Sodium: 138 mmol/L (ref 134–144)
Total Protein: 7 g/dL (ref 6.0–8.5)

## 2018-07-19 LAB — LIPID PANEL
Chol/HDL Ratio: 5.8 ratio — ABNORMAL HIGH (ref 0.0–5.0)
Cholesterol, Total: 174 mg/dL (ref 100–199)
HDL: 30 mg/dL — ABNORMAL LOW (ref >39)
LDL Calculated: 105 mg/dL — ABNORMAL HIGH (ref 0–99)
Triglycerides: 194 mg/dL — ABNORMAL HIGH (ref 0–149)
VLDL Cholesterol Cal: 39 mg/dL (ref 5–40)

## 2018-09-20 ENCOUNTER — Encounter: Payer: Self-pay | Admitting: Radiology

## 2018-09-28 ENCOUNTER — Other Ambulatory Visit: Payer: Self-pay | Admitting: Family Medicine

## 2018-09-28 DIAGNOSIS — I10 Essential (primary) hypertension: Secondary | ICD-10-CM

## 2019-01-05 DIAGNOSIS — Z03818 Encounter for observation for suspected exposure to other biological agents ruled out: Secondary | ICD-10-CM | POA: Diagnosis not present

## 2019-01-17 ENCOUNTER — Ambulatory Visit: Payer: Medicare HMO | Admitting: Family Medicine

## 2019-02-11 ENCOUNTER — Ambulatory Visit (INDEPENDENT_AMBULATORY_CARE_PROVIDER_SITE_OTHER): Payer: Medicare HMO | Admitting: Family Medicine

## 2019-02-11 ENCOUNTER — Other Ambulatory Visit: Payer: Self-pay

## 2019-02-11 ENCOUNTER — Encounter: Payer: Self-pay | Admitting: Family Medicine

## 2019-02-11 VITALS — BP 136/90 | HR 97 | Temp 98.0°F | Resp 15 | Ht 75.0 in | Wt 291.0 lb

## 2019-02-11 DIAGNOSIS — I10 Essential (primary) hypertension: Secondary | ICD-10-CM | POA: Diagnosis not present

## 2019-02-11 DIAGNOSIS — R519 Headache, unspecified: Secondary | ICD-10-CM | POA: Diagnosis not present

## 2019-02-11 DIAGNOSIS — R61 Generalized hyperhidrosis: Secondary | ICD-10-CM

## 2019-02-11 DIAGNOSIS — E785 Hyperlipidemia, unspecified: Secondary | ICD-10-CM | POA: Diagnosis not present

## 2019-02-11 MED ORDER — LOSARTAN POTASSIUM-HCTZ 100-12.5 MG PO TABS: 1.0000 | ORAL_TABLET | Freq: Every day | ORAL | 1 refills | Status: DC

## 2019-02-11 MED ORDER — ATORVASTATIN CALCIUM 10 MG PO TABS: 10.0000 mg | ORAL_TABLET | Freq: Every day | ORAL | 1 refills | Status: DC

## 2019-02-11 NOTE — Patient Instructions (Addendum)
  With symptoms yesterday, I would recommend a covid test. Quarantine until that result is known. If headache and dizziness not improving in next 2-3 days, please schedule follow up telemed visit.  Return to the clinic or go to the nearest emergency room if any of your symptoms worsen or new symptoms occur.  No change in other meds for now.       If you have lab work done today you will be contacted with your lab results within the next 2 weeks.  If you have not heard from [place] then please contact us. The fastest way to get your results is to register for My Chart.   IF you received an x-ray today, you will receive an invoice from [facility]. Please contact [facility] at [phone] with questions or concerns regarding your invoice.   IF you received labwork today, you will receive an invoice from [place]. Please contact [facility] at [phone] with questions or concerns regarding your invoice.   Our billing staff will not be able to assist you with questions regarding bills from these companies.  You will be contacted with the lab results as soon as they are available. The fastest way to get your results is to activate your My Chart account. Instructions are located on the last page of this paperwork. If you have not heard from [place] regarding the results in 2 weeks, please contact this office.

## 2019-02-11 NOTE — Progress Notes (Signed)
Subjective:  Patient ID: Parker Saunders, male    DOB: 04/07/1965  Age: 54 y.o. MRN: [id number]  CC:  Chief Complaint  Patient presents with  . Follow-up    on pts BP and other conditions. pt has been having heachaches and some accasinal dizzieness per pt. worried it has something to do with his BP.    HPI Parker Saunders presents for   Hypertension: Losartan hydrochlorothiazide 100/12.5 mg daily.  Does report some recent headaches, dizziness at times. Headache past few days- comes and goes, both sides. No fever/chills. Some sweating yesterday when working around the house - more than usual.  no change in taste/smell. No recent sick contacts (daughters and wife with positive covid test 11/27 - his was negative).   No N/V/D. No chest pain/tightness. No dyspnea/cough.  Eating ok.  No new myalgias.  No vision changes, no weakness.. Took ibuprofen and excedrin - min change. Few episodes of dizziness yesterday - lightheaded. Not today - just HA.   Borderline readings in June of last year, plan for some diet changes, portion control to help with weight loss.  Weight has decreased 7 pounds - increased walking and cutting back on portions, more salad, less fried foods.  Home readings 116/70.   Has not taken meds yet today.  Wt Readings from Last 3 Encounters:  02/11/19 291 lb (132 kg)  07/18/18 298 lb (135.2 kg)  01/16/18 292 lb 3.2 oz (132.5 kg)    Home readings: BP Readings from Last 3 Encounters:  02/11/19 136/90  07/18/18 140/79  01/16/18 130/80   Lab Results  Component Value Date   CREATININE 1.09 07/18/2018   Hyperlipidemia: Lipitor 10 mg daily.  No new myalgias.  Lab Results  Component Value Date   CHOL 174 07/18/2018   HDL 30 (L) 07/18/2018   LDLCALC 105 (H) 07/18/2018   TRIG 194 (H) 07/18/2018   CHOLHDL 5.8 (H) 07/18/2018   Lab Results  Component Value Date   ALT 28 07/18/2018   AST 22 07/18/2018   ALKPHOS 83 07/18/2018   BILITOT 0.4 07/18/2018         History Patient Active Problem List   Diagnosis Date Noted  . Spinal stenosis of lumbar region 02/04/2016  . Unspecified essential hypertension 01/25/2013   Past Medical History:  Diagnosis Date  . Hypertension   . Renal stone   . Rotator cuff disorder    Past Surgical History:  Procedure Laterality Date  . BACK SURGERY  2005  . LUMBAR LAMINECTOMY/DECOMPRESSION MICRODISCECTOMY Left 02/04/2016   Procedure: REDO MICRO LUMBAR DECOMPRESSION L4-5, L5-S1 ON THE LEFT  2 LEVELS;  Surgeon: [name], MD;  Location: [facility];  Service: Orthopedics;  Laterality: Left;  . SHOULDER SURGERY Right 2014  . SPINE SURGERY  02/07/2005   [facility]   No Known Allergies Prior to Admission medications   Medication Sig Start Date End Date Taking? Authorizing Provider  atorvastatin (LIPITOR) 10 MG tablet Take 1 tablet (10 mg total) by mouth daily. 07/18/18  Yes Shade Flood, MD  losartan-hydrochlorothiazide (HYZAAR) 100-12.5 MG tablet Take 1 tablet by mouth daily. 07/18/18  Yes Shade Flood, MD   Social History   Socioeconomic History  . Marital status: Married    Spouse name: Not on file  . Number of children: 2  . Years of education: Not on file  . Highest education level: Not on file  Occupational History  . Occupation: unemployed    Comment: since 2015  Tobacco Use  .  Smoking status: Never Smoker  . Smokeless tobacco: Never Used  Substance and Sexual Activity  . Alcohol use: No  . Drug use: No  . Sexual activity: Yes  Other Topics Concern  . Not on file  Social History Narrative   Marital status: married      Children: 2 daughters      Lives: with wife, 2 daughters      Employment: unemployed after termination in 2015     Tobacco: none      Alcohol: none      Exercise:  Walking 4-5 miles daily   Social Determinants of Health   Financial Resource Strain:   . Difficulty of Paying Living Expenses: Not on file  Food Insecurity:   . Worried About  Programme researcher, broadcasting/film/video in the Last Year: Not on file  . Ran Out of Food in the Last Year: Not on file  Transportation Needs:   . Lack of Transportation (Medical): Not on file  . Lack of Transportation (Non-Medical): Not on file  Physical Activity:   . Days of Exercise per Week: Not on file  . Minutes of Exercise per Session: Not on file  Stress:   . Feeling of Stress : Not on file  Social Connections:   . Frequency of Communication with Friends and Family: Not on file  . Frequency of Social Gatherings with Friends and Family: Not on file  . Attends Religious Services: Not on file  . Active Member of Clubs or Organizations: Not on file  . Attends Banker Meetings: Not on file  . Marital Status: Not on file  Intimate Partner Violence:   . Fear of Current or Ex-Partner: Not on file  . Emotionally Abused: Not on file  . Physically Abused: Not on file  . Sexually Abused: Not on file    Review of Systems  Constitutional: Positive for diaphoresis. Negative for chills, fatigue and unexpected weight change.  Eyes: Negative for visual disturbance.  Respiratory: Negative for cough, chest tightness and shortness of breath.   Cardiovascular: Negative for chest pain, palpitations and leg swelling.  Gastrointestinal: Negative for abdominal pain and blood in stool.  Neurological: Positive for headaches. Negative for dizziness and light-headedness.   Per HPI.     Objective:   Vitals:   02/11/19 1102 02/11/19 1107  BP: (!) 149/84 136/90  Pulse: 97   Resp: 15   Temp: 98 F (36.7 C)   TempSrc: Temporal   SpO2: 97%   Weight: 291 lb (132 kg)   Height: 6\' 3"  (1.905 m)      Physical Exam Vitals reviewed.  Constitutional:      General: He is not in acute distress.    Appearance: He is well-developed. He is not ill-appearing, toxic-appearing or diaphoretic.  HENT:     Head: Normocephalic and atraumatic.  Eyes:     Pupils: Pupils are equal, round, and reactive to light.   Neck:     Vascular: No carotid bruit or JVD.  Cardiovascular:     Rate and Rhythm: Normal rate and regular rhythm.     Heart sounds: Normal heart sounds. No murmur.  Pulmonary:     Effort: Pulmonary effort is normal. No respiratory distress.     Breath sounds: Normal breath sounds. No wheezing or rales.  Skin:    General: Skin is warm and dry.  Neurological:     General: No focal deficit present.     Mental Status: He is alert and  oriented to person, place, and time.     Motor: No weakness.     Gait: Gait normal.      26 min visit - greater than 50% counseling.   Assessment & Plan:  Parker Saunders is a 54 y.o. male . Essential hypertension - Plan: Comprehensive metabolic panel, losartan-hydrochlorothiazide (HYZAAR) 100-12.5 MG tablet  -  Stable, tolerating current regimen. Medications refilled. Labs pending as above.   Hyperlipidemia, unspecified hyperlipidemia type - Plan: Lipid panel, atorvastatin (LIPITOR) 10 MG tablet  -  Stable, tolerating current regimen. Medications refilled. Labs pending as above.   Acute nonintractable headache, unspecified headache type - Plan: Novel Coronavirus, NAA ([facility]) Sweating increase - Plan: Novel Coronavirus, NAA ([facility])   -3-4 day history of HA, with sweating dizziness yesterday, only persistent HA today. check covid testing, quarantine until results, and sx care, RTC/ER precautions given.    Meds ordered this encounter  Medications  . losartan-hydrochlorothiazide (HYZAAR) 100-12.5 MG tablet    Sig: Take 1 tablet by mouth daily.    Dispense:  90 tablet    Refill:  1  . atorvastatin (LIPITOR) 10 MG tablet    Sig: Take 1 tablet (10 mg total) by mouth daily.    Dispense:  90 tablet    Refill:  1   Patient Instructions    With symptoms yesterday, I would recommend a covid test. Quarantine until that result is known. If headache and dizziness not improving in next 2-3 days, please schedule follow up telemed visit.  Return to the  clinic or go to the nearest emergency room if any of your symptoms worsen or new symptoms occur.  No change in other meds for now.       If you have lab work done today you will be contacted with your lab results within the next 2 weeks.  If you have not heard from [place] then please contact us. The fastest way to get your results is to register for My Chart.   IF you received an x-ray today, you will receive an invoice from [facility]. Please contact [facility] at [phone] with questions or concerns regarding your invoice.   IF you received labwork today, you will receive an invoice from [place]. Please contact [facility] at [phone] with questions or concerns regarding your invoice.   Our billing staff will not be able to assist you with questions regarding bills from these companies.  You will be contacted with the lab results as soon as they are available. The fastest way to get your results is to activate your My Chart account. Instructions are located on the last page of this paperwork. If you have not heard from [place] regarding the results in 2 weeks, please contact this office.         Signed, Merri Ray, MD [facility]

## 2019-02-11 NOTE — Progress Notes (Signed)
11

## 2019-02-12 LAB — COMPREHENSIVE METABOLIC PANEL
ALT: 26 IU/L (ref 0–44)
AST: 19 IU/L (ref 0–40)
Albumin/Globulin Ratio: 1.7 (ref 1.2–2.2)
Albumin: 4.7 g/dL (ref 3.8–4.9)
Alkaline Phosphatase: 96 IU/L (ref 39–117)
BUN/Creatinine Ratio: 12 (ref 9–20)
BUN: 13 mg/dL (ref 6–24)
Bilirubin Total: 0.4 mg/dL (ref 0.0–1.2)
CO2: 23 mmol/L (ref 20–29)
Calcium: 9.8 mg/dL (ref 8.7–10.2)
Chloride: 104 mmol/L (ref 96–106)
Creatinine, Ser: 1.1 mg/dL (ref 0.76–1.27)
GFR calc Af Amer: 88 mL/min/{1.73_m2} (ref >59)
GFR calc non Af Amer: 76 mL/min/{1.73_m2} (ref >59)
Globulin, Total: 2.8 g/dL (ref 1.5–4.5)
Glucose: 93 mg/dL (ref 65–99)
Potassium: 4.6 mmol/L (ref 3.5–5.2)
Sodium: 142 mmol/L (ref 134–144)
Total Protein: 7.5 g/dL (ref 6.0–8.5)

## 2019-02-12 LAB — LIPID PANEL
Chol/HDL Ratio: 5.4 ratio — ABNORMAL HIGH (ref 0.0–5.0)
Cholesterol, Total: 183 mg/dL (ref 100–199)
HDL: 34 mg/dL — ABNORMAL LOW (ref >39)
LDL Chol Calc (NIH): 111 mg/dL — ABNORMAL HIGH (ref 0–99)
Triglycerides: 215 mg/dL — ABNORMAL HIGH (ref 0–149)
VLDL Cholesterol Cal: 38 mg/dL (ref 5–40)

## 2019-02-12 LAB — NOVEL CORONAVIRUS, NAA: SARS-CoV-2, NAA: NOT DETECTED

## 2019-03-25 ENCOUNTER — Encounter: Payer: Self-pay | Admitting: Family Medicine

## 2019-03-25 ENCOUNTER — Ambulatory Visit (INDEPENDENT_AMBULATORY_CARE_PROVIDER_SITE_OTHER): Payer: Medicare HMO | Admitting: Family Medicine

## 2019-03-25 ENCOUNTER — Other Ambulatory Visit: Payer: Self-pay

## 2019-03-25 VITALS — BP 126/84 | HR 84 | Temp 98.1°F | Ht 75.0 in | Wt 295.0 lb

## 2019-03-25 DIAGNOSIS — I10 Essential (primary) hypertension: Secondary | ICD-10-CM

## 2019-03-25 DIAGNOSIS — E785 Hyperlipidemia, unspecified: Secondary | ICD-10-CM | POA: Diagnosis not present

## 2019-03-25 MED ORDER — ATORVASTATIN CALCIUM 20 MG PO TABS: 20.0000 mg | ORAL_TABLET | Freq: Every day | ORAL | 1 refills | Status: DC

## 2019-03-25 NOTE — Progress Notes (Signed)
Subjective:  Patient ID: Parker Saunders, male    DOB: March 02, 1965  Age: 54 y.o. MRN: [id number]  CC:  Chief Complaint  Patient presents with  . Follow-up    on hypertension, headaches, and dizzieness. pt is no longer having the headaches or the dizzieness. pt checks his bp daily and pt states it has been normal. no physical symptoms of hypertension. medicaitions are working well with no side effects.    HPI [name] presents for   Hypertension: Follow-up from January 4.  Some headache, dizziness  at that time.  The same regimen.  Covid testing was obtained which was negative.  Since last visit dizziness and headache have resolved.  Continue to take Hyzaar, Lipitor without difficulty.  Feels well now.  No new side effects of meds.  Home readings: BP Readings from Last 3 Encounters:  03/25/19 126/84  02/11/19 136/90  07/18/18 140/79   Lab Results  Component Value Date   CREATININE 1.10 02/11/2019   Hyperlipidemia: lipitor 10mg  qd.  Lab Results  Component Value Date   CHOL 183 02/11/2019   HDL 34 (L) 02/11/2019   LDLCALC 111 (H) 02/11/2019   TRIG 215 (H) 02/11/2019   CHOLHDL 5.4 (H) 02/11/2019   Lab Results  Component Value Date   ALT 26 02/11/2019   AST 19 02/11/2019   ALKPHOS 96 02/11/2019   BILITOT 0.4 02/11/2019      History Patient Active Problem List   Diagnosis Date Noted  . Spinal stenosis of lumbar region 02/04/2016  . Unspecified essential hypertension 01/25/2013   Past Medical History:  Diagnosis Date  . Hypertension   . Renal stone   . Rotator cuff disorder    Past Surgical History:  Procedure Laterality Date  . BACK SURGERY  2005  . LUMBAR LAMINECTOMY/DECOMPRESSION MICRODISCECTOMY Left 02/04/2016   Procedure: REDO MICRO LUMBAR DECOMPRESSION L4-5, L5-S1 ON THE LEFT  2 LEVELS;  Surgeon: Susa Day, MD;  Location: [facility];  Service: Orthopedics;  Laterality: Left;  . SHOULDER SURGERY Right 2014  . SPINE SURGERY  02/07/2005   [facility]   No Known Allergies Prior to Admission medications   Medication Sig Start Date End Date Taking? Authorizing Provider  atorvastatin (LIPITOR) 10 MG tablet Take 1 tablet (10 mg total) by mouth daily. 02/11/19  Yes Wendie Agreste, MD  losartan-hydrochlorothiazide (HYZAAR) 100-12.5 MG tablet Take 1 tablet by mouth daily. 02/11/19  Yes Wendie Agreste, MD   Social History   Socioeconomic History  . Marital status: Married    Spouse name: Not on file  . Number of children: 2  . Years of education: Not on file  . Highest education level: Not on file  Occupational History  . Occupation: unemployed    Comment: since 2015  Tobacco Use  . Smoking status: Never Smoker  . Smokeless tobacco: Never Used  Substance and Sexual Activity  . Alcohol use: No  . Drug use: No  . Sexual activity: Yes  Other Topics Concern  . Not on file  Social History Narrative   Marital status: married      Children: 2 daughters      Lives: with wife, 2 daughters      Employment: unemployed after termination in 2015     Tobacco: none      Alcohol: none      Exercise:  Walking 4-5 miles daily   Social Determinants of Health   Financial Resource Strain:   . Difficulty of Paying Living  Expenses: Not on file  Food Insecurity:   . Worried About Programme researcher, broadcasting/film/video in the Last Year: Not on file  . Ran Out of Food in the Last Year: Not on file  Transportation Needs:   . Lack of Transportation (Medical): Not on file  . Lack of Transportation (Non-Medical): Not on file  Physical Activity:   . Days of Exercise per Week: Not on file  . Minutes of Exercise per Session: Not on file  Stress:   . Feeling of Stress : Not on file  Social Connections:   . Frequency of Communication with Friends and Family: Not on file  . Frequency of Social Gatherings with Friends and Family: Not on file  . Attends Religious Services: Not on file  . Active Member of Clubs or Organizations: Not on file  . Attends Tax inspector Meetings: Not on file  . Marital Status: Not on file  Intimate Partner Violence:   . Fear of Current or Ex-Partner: Not on file  . Emotionally Abused: Not on file  . Physically Abused: Not on file  . Sexually Abused: Not on file    Review of Systems  Constitutional: Negative for fatigue and unexpected weight change.  Eyes: Negative for visual disturbance.  Respiratory: Negative for cough, chest tightness and shortness of breath.   Cardiovascular: Negative for chest pain, palpitations and leg swelling.  Gastrointestinal: Negative for abdominal pain and blood in stool.  Neurological: Negative for dizziness, light-headedness and headaches.     Objective:   Vitals:   03/25/19 1017  BP: 126/84  Pulse: 84  Temp: 98.1 F (36.7 C)  TempSrc: Temporal  SpO2: 97%  Weight: 295 lb (133.8 kg)  Height: 6\' 3"  (1.905 m)     Physical Exam Vitals reviewed.  Constitutional:      Appearance: He is well-developed.  HENT:     Head: Normocephalic and atraumatic.  Eyes:     Pupils: Pupils are equal, round, and reactive to light.  Neck:     Vascular: No carotid bruit or JVD.  Cardiovascular:     Rate and Rhythm: Normal rate and regular rhythm.     Heart sounds: Normal heart sounds. No murmur.  Pulmonary:     Effort: Pulmonary effort is normal.     Breath sounds: Normal breath sounds. No rales.  Skin:    General: Skin is warm and dry.  Neurological:     Mental Status: He is alert and oriented to person, place, and time.        Assessment & Plan:  Parker Saunders is a 54 y.o. male . Hyperlipidemia, unspecified hyperlipidemia type - Plan: Lipid panel, atorvastatin (LIPITOR) 20 MG tablet  - try higher dose lipitor - 20mg . Potential side effects discussed. Lab visit 6 weeks, OV 6 months.   Essential hypertension - Plan: Comprehensive metabolic panel  - stable. Prior HA, dizziness resolved. No changes, rtc precautions.   Meds ordered this encounter  Medications  .  atorvastatin (LIPITOR) 20 MG tablet    Sig: Take 1 tablet (20 mg total) by mouth daily.    Dispense:  90 tablet    Refill:  1   Patient Instructions    Try taking 2 Lipitor daily - if tolerated - can fill new Rx - 20mg  once per day. No BP med changes for now  Lab visit in 6 weeks, office visit in 6 months.     If you have lab work done today you will be  contacted with your lab results within the next 2 weeks.  If you have not heard from [place] then please contact us. The fastest way to get your results is to register for My Chart.   IF you received an x-ray today, you will receive an invoice from [facility]. Please contact [facility] at [phone] with questions or concerns regarding your invoice.   IF you received labwork today, you will receive an invoice from [facility]. Please contact [facility] at [phone] with questions or concerns regarding your invoice.   Our billing staff will not be able to assist you with questions regarding bills from these companies.  You will be contacted with the lab results as soon as they are available. The fastest way to get your results is to activate your My Chart account. Instructions are located on the last page of this paperwork. If you have not heard from [place] regarding the results in 2 weeks, please contact this office.         Signed, Merri Ray, MD [facility]

## 2019-03-25 NOTE — Patient Instructions (Addendum)
  Try taking 2 Lipitor daily - if tolerated - can fill new Rx - 20mg  once per day. No BP med changes for now  Lab visit in 6 weeks, office visit in 6 months.     If you have lab work done today you will be contacted with your lab results within the next 2 weeks.  If you have not heard from then please contact us. The fastest way to get your results is to register for My Chart.   IF you received an x-ray today, you will receive an invoice from [facility]. Please contact [facility] at [phone] with questions or concerns regarding your invoice.   IF you received labwork today, you will receive an invoice from [place] [facility]. Please contact [facility] at [phone] with questions or concerns regarding your invoice.   Our billing staff will not be able to assist you with questions regarding bills from these companies.  You will be contacted with the lab results as soon as they are available. The fastest way to get your results is to activate your My Chart account. Instructions are located on the last page of this paperwork. If you have not heard from [phone] regarding the results in 2 weeks, please contact this office.

## 2019-05-06 ENCOUNTER — Other Ambulatory Visit: Payer: Self-pay

## 2019-05-06 ENCOUNTER — Ambulatory Visit: Payer: Medicare HMO

## 2019-05-06 DIAGNOSIS — I10 Essential (primary) hypertension: Secondary | ICD-10-CM | POA: Diagnosis not present

## 2019-05-06 DIAGNOSIS — E785 Hyperlipidemia, unspecified: Secondary | ICD-10-CM | POA: Diagnosis not present

## 2019-05-07 LAB — COMPREHENSIVE METABOLIC PANEL
ALT: 25 IU/L (ref 0–44)
AST: 18 IU/L (ref 0–40)
Albumin/Globulin Ratio: 1.7 (ref 1.2–2.2)
Albumin: 4.4 g/dL (ref 3.8–4.9)
Alkaline Phosphatase: 94 IU/L (ref 39–117)
BUN/Creatinine Ratio: 14 (ref 9–20)
BUN: 15 mg/dL (ref 6–24)
Bilirubin Total: 0.5 mg/dL (ref 0.0–1.2)
CO2: 21 mmol/L (ref 20–29)
Calcium: 9.2 mg/dL (ref 8.7–10.2)
Chloride: 105 mmol/L (ref 96–106)
Creatinine, Ser: 1.06 mg/dL (ref 0.76–1.27)
GFR calc Af Amer: 92 mL/min/{1.73_m2} (ref >59)
GFR calc non Af Amer: 79 mL/min/{1.73_m2} (ref >59)
Globulin, Total: 2.6 g/dL (ref 1.5–4.5)
Glucose: 101 mg/dL — ABNORMAL HIGH (ref 65–99)
Potassium: 4 mmol/L (ref 3.5–5.2)
Sodium: 140 mmol/L (ref 134–144)
Total Protein: 7 g/dL (ref 6.0–8.5)

## 2019-05-07 LAB — LIPID PANEL
Chol/HDL Ratio: 4.5 ratio (ref 0.0–5.0)
Cholesterol, Total: 145 mg/dL (ref 100–199)
HDL: 32 mg/dL — ABNORMAL LOW (ref >39)
LDL Chol Calc (NIH): 88 mg/dL (ref 0–99)
Triglycerides: 143 mg/dL (ref 0–149)
VLDL Cholesterol Cal: 25 mg/dL (ref 5–40)

## 2019-05-13 ENCOUNTER — Encounter: Payer: Self-pay | Admitting: Radiology

## 2019-05-22 ENCOUNTER — Telehealth: Payer: Self-pay | Admitting: *Deleted

## 2019-05-22 NOTE — Telephone Encounter (Signed)
Schedule AWV.  

## 2019-05-22 NOTE — Telephone Encounter (Signed)
Pt called back regarding this issue.  

## 2019-05-23 ENCOUNTER — Telehealth: Payer: Self-pay | Admitting: *Deleted

## 2019-05-23 NOTE — Telephone Encounter (Signed)
Returning call to schedule AWV 

## 2019-06-03 ENCOUNTER — Ambulatory Visit (INDEPENDENT_AMBULATORY_CARE_PROVIDER_SITE_OTHER): Payer: Medicare HMO | Admitting: Family Medicine

## 2019-06-03 VITALS — BP 126/84 | Ht 75.0 in | Wt 295.0 lb

## 2019-06-03 DIAGNOSIS — Z0000 Encounter for general adult medical examination without abnormal findings: Secondary | ICD-10-CM

## 2019-06-03 NOTE — Progress Notes (Signed)
Presents today for [employer] Visit   Date of last exam: 03/25/2019  I connected with  Parker Saunders on 06/03/19 by a telephone telemedicine application and verified that I am speaking with the correct person using two identifiers.   I discussed the limitations of evaluation and management by telemedicine. The patient expressed understanding and agreed to proceed.   Patient Care Team: Shade Flood, MD as PCP - General (Family Medicine)   Other items to address today:   Discussed Eye /Dental Discussed immunizations Follow scheduled Dr. Neva Seat 8/18 @9 :00am   Other Screening: Last screening for diabetes: 05/06/2019 Last lipid screening: 05-06-2019  ADVANCE DIRECTIVES: Discussed: yes On File: no Materials Provided: yes  Immunization status:  Immunization History  Administered Date(s) Administered  . Tdap 01/16/2018     Health Maintenance Due  Topic Date Due  . COVID-19 Vaccine (1) Never done     Functional Status Survey: Is the patient deaf or have difficulty hearing?: No Does the patient have difficulty seeing, even when wearing glasses/contacts?: No Does the patient have difficulty concentrating, remembering, or making decisions?: No Does the patient have difficulty walking or climbing stairs?: No Does the patient have difficulty dressing or bathing?: No Does the patient have difficulty doing errands alone such as visiting a doctor's office or shopping?: No   6CIT Screen 06/03/2019  What Year? 0 points  What month? 0 points  What time? 0 points  Count back from 20 0 points  Months in reverse 0 points  Repeat phrase 0 points  Total Score 0        Clinical Support from 06/03/2019 in [facility]  AUDIT-C Score  0       Home Environment:   Lives in a one story home No trouble climbing stairs No scattered rugs No grab bars Adequate lighting/ no clutter   Patient Active Problem List   Diagnosis Date Noted  .  Spinal stenosis of lumbar region 02/04/2016  . Unspecified essential hypertension 01/25/2013     Past Medical History:  Diagnosis Date  . Hypertension   . Renal stone   . Rotator cuff disorder      Past Surgical History:  Procedure Laterality Date  . BACK SURGERY  2005  . LUMBAR LAMINECTOMY/DECOMPRESSION MICRODISCECTOMY Left 02/04/2016   Procedure: REDO MICRO LUMBAR DECOMPRESSION L4-5, L5-S1 ON THE LEFT  2 LEVELS;  Surgeon: 02/06/2016, MD;  Location: [facility];  Service: Orthopedics;  Laterality: Left;  . SHOULDER SURGERY Right 2014  . SPINE SURGERY  02/07/2005   [facility]     Family History  Problem Relation Age of Onset  . Hyperlipidemia Mother   . Hypertension Mother   . COPD Father   . Hyperlipidemia Father   . Hypertension Father      Social History   Socioeconomic History  . Marital status: Married    Spouse name: Not on file  . Number of children: 2  . Years of education: Not on file  . Highest education level: Not on file  Occupational History  . Occupation: unemployed    Comment: since 2015  Tobacco Use  . Smoking status: Never Smoker  . Smokeless tobacco: Never Used  Substance and Sexual Activity  . Alcohol use: No  . Drug use: No  . Sexual activity: Yes  Other Topics Concern  . Not on file  Social History Narrative   Marital status: married      Children: 2 daughters  Lives: with wife, 2 daughters      Employment: unemployed after termination in 2015     Tobacco: none      Alcohol: none      Exercise:  Walking 4-5 miles daily   Social Determinants of Health   Financial Resource Strain:   . Difficulty of Paying Living Expenses:   Food Insecurity:   . Worried About Charity fundraiser in the Last Year:   . Arboriculturist in the Last Year:   Transportation Needs:   . Film/video editor (Medical):   [name] Lack of Transportation (Non-Medical):   Physical Activity:   . Days of Exercise per Week:   . Minutes of Exercise per  Session:   Stress:   . Feeling of Stress :   Social Connections:   . Frequency of Communication with Friends and Family:   . Frequency of Social Gatherings with Friends and Family:   . Attends Religious Services:   . Active Member of Clubs or Organizations:   . Attends Archivist Meetings:   [name] Marital Status:   Intimate Partner Violence:   . Fear of Current or Ex-Partner:   . Emotionally Abused:   [name] Physically Abused:   . Sexually Abused:      No Known Allergies   Prior to Admission medications   Medication Sig Start Date End Date Taking? Authorizing Provider  atorvastatin (LIPITOR) 20 MG tablet Take 1 tablet (20 mg total) by mouth daily. 03/25/19  Yes Wendie Agreste, MD  losartan-hydrochlorothiazide (HYZAAR) 100-12.5 MG tablet Take 1 tablet by mouth daily. 02/11/19  Yes Wendie Agreste, MD     Depression screen [facility] 2/9 06/03/2019 03/25/2019 02/11/2019 07/18/2018 01/16/2018  Decreased Interest 0 0 0 0 0  Down, Depressed, Hopeless 0 0 0 0 0  PHQ - 2 Score 0 0 0 0 0     Fall Risk  06/03/2019 03/25/2019 02/11/2019 07/18/2018 01/16/2018  Falls in the past year? 0 0 0 0 0  Number falls in past yr: 0 0 0 0 -  Injury with Fall? 0 0 0 0 -  Follow up Falls evaluation completed;Education provided - Falls evaluation completed Falls evaluation completed -      PHYSICAL EXAM: BP 126/84 Comment: TAKEN FROM A PREVIOUS VISIT  Ht 6\' 3"  (1.905 m)   Wt 295 lb (133.8 kg)   BMI 36.87 kg/m    Wt Readings from Last 3 Encounters:  06/03/19 295 lb (133.8 kg)  03/25/19 295 lb (133.8 kg)  02/11/19 291 lb (132 kg)       Education/Counseling provided regarding diet and exercise, prevention of chronic diseases, smoking/tobacco cessation, if applicable, and reviewed "Covered Medicare Preventive Services."

## 2019-06-03 NOTE — Patient Instructions (Addendum)
Thank you for taking time to come for your Medicare Wellness Visit. I appreciate your ongoing commitment to your health goals. Please review the following plan we discussed and let me know if I can assist you in the future.  Parker Kennedy LPN  Preventive Care 55-54 Years Old, Male Preventive care refers to lifestyle choices and visits with your health care provider that can promote health and wellness. This includes:  A yearly physical exam. This is also called an annual well check.  Regular dental and eye exams.  Immunizations.  Screening for certain conditions.  Healthy lifestyle choices, such as eating a healthy diet, getting regular exercise, not using drugs or products that contain nicotine and tobacco, and limiting alcohol use. What can I expect for my preventive care visit? Physical exam Your health care provider will check:  Height and weight. These may be used to calculate body mass index (BMI), which is a measurement that tells if you are at a healthy weight.  Heart rate and blood pressure.  Your skin for abnormal spots. Counseling Your health care provider may ask you questions about:  Alcohol, tobacco, and drug use.  Emotional well-being.  Home and relationship well-being.  Sexual activity.  Eating habits.  Work and work Statistician. What immunizations do I need?  Influenza (flu) vaccine  This is recommended every year. Tetanus, diphtheria, and pertussis (Tdap) vaccine  You may need a Td booster every 10 years. Varicella (chickenpox) vaccine  You may need this vaccine if you have not already been vaccinated. Zoster (shingles) vaccine  You may need this after age 55. Measles, mumps, and rubella (MMR) vaccine  You may need at least one dose of MMR if you were born in 1957 or later. You may also need a second dose. Pneumococcal conjugate (PCV13) vaccine  You may need this if you have certain conditions and were not previously vaccinated. Pneumococcal  polysaccharide (PPSV23) vaccine  You may need one or two doses if you smoke cigarettes or if you have certain conditions. Meningococcal conjugate (MenACWY) vaccine  You may need this if you have certain conditions. Hepatitis A vaccine  You may need this if you have certain conditions or if you travel or work in places where you may be exposed to hepatitis A. Hepatitis B vaccine  You may need this if you have certain conditions or if you travel or work in places where you may be exposed to hepatitis B. Haemophilus influenzae type b (Hib) vaccine  You may need this if you have certain risk factors. Human papillomavirus (HPV) vaccine  If recommended by your health care provider, you may need three doses over 6 months. You may receive vaccines as individual doses or as more than one vaccine together in one shot (combination vaccines). Talk with your health care provider about the risks and benefits of combination vaccines. What tests do I need? Blood tests  Lipid and cholesterol levels. These may be checked every 5 years, or more frequently if you are over 55 years old.  Hepatitis C test.  Hepatitis B test. Screening  Lung cancer screening. You may have this screening every year starting at age 55 if you have a 30-pack-year history of smoking and currently smoke or have quit within the past 15 years.  Prostate cancer screening. Recommendations will vary depending on your family history and other risks.  Colorectal cancer screening. All adults should have this screening starting at age 19 and continuing until age 55. Your health care provider may  recommend screening at age 55 if you are at increased risk. You will have tests every 1-10 years, depending on your results and the type of screening test.  Diabetes screening. This is done by checking your blood sugar (glucose) after you have not eaten for a while (fasting). You may have this done every 1-3 years.  Sexually transmitted  disease (STD) testing. Follow these instructions at home: Eating and drinking  Eat a diet that includes fresh fruits and vegetables, whole grains, lean protein, and low-fat dairy products.  Take vitamin and mineral supplements as recommended by your health care provider.  Do not drink alcohol if your health care provider tells you not to drink.  If you drink alcohol: ? Limit how much you have to 0-2 drinks a day. ? Be aware of how much alcohol is in your drink. In the [place] one drink equals one 12 oz bottle of beer (355 mL), one 5 oz glass of wine (148 mL), or one 1 oz glass of hard liquor (44 mL). Lifestyle  Take daily care of your teeth and gums.  Stay active. Exercise for at least 30 minutes on 5 or more days each week.  Do not use any products that contain nicotine or tobacco, such as cigarettes, e-cigarettes, and chewing tobacco. If you need help quitting, ask your health care provider.  If you are sexually active, practice safe sex. Use a condom or other form of protection to prevent STIs (sexually transmitted infections).  Talk with your health care provider about taking a low-dose aspirin every day starting at age 55. What's next?  Go to your health care provider once a year for a well check visit.  Ask your health care provider how often you should have your eyes and teeth checked.  Stay up to date on all vaccines. This information is not intended to replace advice given to you by your health care provider. Make sure you discuss any questions you have with your health care provider. Document Revised: 01/18/2018 Document Reviewed: 01/18/2018 Elsevier Patient Education  2020 [employer].

## 2019-07-11 DIAGNOSIS — H524 Presbyopia: Secondary | ICD-10-CM | POA: Diagnosis not present

## 2019-07-11 DIAGNOSIS — H04123 Dry eye syndrome of bilateral lacrimal glands: Secondary | ICD-10-CM | POA: Diagnosis not present

## 2019-09-19 ENCOUNTER — Other Ambulatory Visit: Payer: Self-pay | Admitting: Family Medicine

## 2019-09-19 DIAGNOSIS — E785 Hyperlipidemia, unspecified: Secondary | ICD-10-CM

## 2019-09-23 ENCOUNTER — Other Ambulatory Visit: Payer: Self-pay | Admitting: Family Medicine

## 2019-09-23 DIAGNOSIS — I10 Essential (primary) hypertension: Secondary | ICD-10-CM

## 2019-09-25 ENCOUNTER — Encounter: Payer: Self-pay | Admitting: Family Medicine

## 2019-09-25 ENCOUNTER — Other Ambulatory Visit: Payer: Self-pay

## 2019-09-25 ENCOUNTER — Ambulatory Visit (INDEPENDENT_AMBULATORY_CARE_PROVIDER_SITE_OTHER): Payer: Medicare HMO | Admitting: Family Medicine

## 2019-09-25 VITALS — BP 132/83 | HR 79 | Temp 98.0°F | Ht 75.0 in | Wt 291.0 lb

## 2019-09-25 DIAGNOSIS — E785 Hyperlipidemia, unspecified: Secondary | ICD-10-CM | POA: Diagnosis not present

## 2019-09-25 DIAGNOSIS — Z1159 Encounter for screening for other viral diseases: Secondary | ICD-10-CM | POA: Diagnosis not present

## 2019-09-25 DIAGNOSIS — I10 Essential (primary) hypertension: Secondary | ICD-10-CM

## 2019-09-25 DIAGNOSIS — Z6836 Body mass index (BMI) 36.0-36.9, adult: Secondary | ICD-10-CM

## 2019-09-25 DIAGNOSIS — Z7189 Other specified counseling: Secondary | ICD-10-CM

## 2019-09-25 DIAGNOSIS — Z7185 Encounter for immunization safety counseling: Secondary | ICD-10-CM

## 2019-09-25 MED ORDER — ATORVASTATIN CALCIUM 20 MG PO TABS: 20.0000 mg | ORAL_TABLET | Freq: Every day | ORAL | 2 refills | Status: DC

## 2019-09-25 MED ORDER — LOSARTAN POTASSIUM-HCTZ 100-12.5 MG PO TABS: 1.0000 | ORAL_TABLET | Freq: Every day | ORAL | 2 refills | Status: DC

## 2019-09-25 NOTE — Progress Notes (Signed)
Subjective:  Patient ID: Parker Saunders, male    DOB: 1965-07-23  Age: 54 y.o. MRN: [id number]  CC:  Chief Complaint  Patient presents with  . Follow-up    on hypertension and hyperlipidemia. Pt reports no issues with his BP since last OV. Pt checks his BP 2x a week. Pt states it runs normaly around 130's/60's. Pt reports no physical symptoms.    HPI Brave Dack presents for   Hypertension: hyzaar 100/12.5mg  QD.  Home readings:130/60's, no new side effects.  BMI 36 - walking for exercise - less with hot weather.   BP Readings from Last 3 Encounters:  09/25/19 132/83  06/03/19 126/84  03/25/19 126/84   Lab Results  Component Value Date   CREATININE 1.06 05/06/2019   Hyperlipidemia: lipitor 20mg  qd. Fasting today. No new side effects.myalgias.  Lab Results  Component Value Date   CHOL 145 05/06/2019   HDL 32 (L) 05/06/2019   LDLCALC 88 05/06/2019   TRIG 143 05/06/2019   CHOLHDL 4.5 05/06/2019   Lab Results  Component Value Date   ALT 25 05/06/2019   AST 18 05/06/2019   ALKPHOS 94 05/06/2019   BILITOT 0.5 05/06/2019   Agrees to Hep C test.   Has not received Covid vaccine, still considering. Concerns addressed.     History Patient Active Problem List   Diagnosis Date Noted  . Spinal stenosis of lumbar region 02/04/2016  . Unspecified essential hypertension 01/25/2013   Past Medical History:  Diagnosis Date  . Hypertension   . Renal stone   . Rotator cuff disorder    Past Surgical History:  Procedure Laterality Date  . BACK SURGERY  2005  . LUMBAR LAMINECTOMY/DECOMPRESSION MICRODISCECTOMY Left 02/04/2016   Procedure: REDO MICRO LUMBAR DECOMPRESSION L4-5, L5-S1 ON THE LEFT  2 LEVELS;  Surgeon: 02/06/2016, MD;  Location: [facility];  Service: Orthopedics;  Laterality: Left;  . SHOULDER SURGERY Right 2014  . SPINE SURGERY  02/07/2005   [facility]   No Known Allergies Prior to Admission medications   Medication Sig Start Date End Date Taking?  Authorizing Provider  atorvastatin (LIPITOR) 20 MG tablet TAKE 1 TABLET BY MOUTH DAILY 09/19/19   11/19/19, MD  losartan-hydrochlorothiazide [facility]) 100-12.5 MG tablet TAKE 1 TABLET BY MOUTH DAILY 09/23/19   09/25/19, MD   Social History   Socioeconomic History  . Marital status: Married    Spouse name: Not on file  . Number of children: 2  . Years of education: Not on file  . Highest education level: Not on file  Occupational History  . Occupation: unemployed    Comment: since 2015  Tobacco Use  . Smoking status: Never Smoker  . Smokeless tobacco: Never Used  Substance and Sexual Activity  . Alcohol use: No  . Drug use: No  . Sexual activity: Yes  Other Topics Concern  . Not on file  Social History Narrative   Marital status: married      Children: 2 daughters      Lives: with wife, 2 daughters      Employment: unemployed after termination in 2015     Tobacco: none      Alcohol: none      Exercise:  Walking 4-5 miles daily   Social Determinants of Health   Financial Resource Strain:   . Difficulty of Paying Living Expenses:   Food Insecurity:   . Worried About 2016 in the Last Year:   .  Ran Out of Food in the Last Year:   Transportation Needs:   . Freight forwarder (Medical):   [name] Lack of Transportation (Non-Medical):   Physical Activity:   . Days of Exercise per Week:   . Minutes of Exercise per Session:   Stress:   . Feeling of Stress :   Social Connections:   . Frequency of Communication with Friends and Family:   . Frequency of Social Gatherings with Friends and Family:   . Attends Religious Services:   . Active Member of Clubs or Organizations:   . Attends Banker Meetings:   [name] Marital Status:   Intimate Partner Violence:   . Fear of Current or Ex-Partner:   . Emotionally Abused:   [name] Physically Abused:   . Sexually Abused:     Review of Systems  Constitutional: Negative for fatigue and unexpected  weight change.  Eyes: Negative for visual disturbance.  Respiratory: Negative for cough, chest tightness and shortness of breath.   Cardiovascular: Negative for chest pain, palpitations and leg swelling.  Gastrointestinal: Negative for abdominal pain and blood in stool.  Neurological: Negative for dizziness, light-headedness and headaches.     Objective:   Vitals:   09/25/19 0912  BP: 132/83  Pulse: 79  Temp: 98 F (36.7 C)  TempSrc: Temporal  SpO2: 99%  Weight: 291 lb (132 kg)  Height: 6\' 3"  (1.905 m)     Physical Exam Vitals reviewed.  Constitutional:      Appearance: He is well-developed. He is obese.  HENT:     Head: Normocephalic and atraumatic.  Eyes:     Pupils: Pupils are equal, round, and reactive to light.  Neck:     Vascular: No carotid bruit or JVD.  Cardiovascular:     Rate and Rhythm: Normal rate and regular rhythm.     Heart sounds: Normal heart sounds. No murmur heard.   Pulmonary:     Effort: Pulmonary effort is normal.     Breath sounds: Normal breath sounds. No rales.  Skin:    General: Skin is warm and dry.  Neurological:     Mental Status: He is alert and oriented to person, place, and time.  Psychiatric:        Mood and Affect: Mood normal.        Behavior: Behavior normal.        Assessment & Plan:  Blayne Frankie is a 54 y.o. male . Hyperlipidemia, unspecified hyperlipidemia type - Plan: Lipid panel, Comprehensive metabolic panel, atorvastatin (LIPITOR) 20 MG tablet  -  Stable, tolerating current regimen. Medications refilled. Labs pending as above.   Essential hypertension - Plan: Lipid panel, Comprehensive metabolic panel, losartan-hydrochlorothiazide (HYZAAR) 100-12.5 MG tablet  -  Stable, tolerating current regimen. Medications refilled. Labs pending as above.   Need for hepatitis C screening test - Plan: Hepatitis C antibody  Vaccine counseling  - covid vaccine recommended, he is considering. All questions answered. 5 min  discussion.    BMI 36.0-36.9,adult  - walking for exercise. Plans more when cool, but is walking in am.     Meds ordered this encounter  Medications  . atorvastatin (LIPITOR) 20 MG tablet    Sig: Take 1 tablet (20 mg total) by mouth daily.    Dispense:  90 tablet    Refill:  2  . losartan-hydrochlorothiazide (HYZAAR) 100-12.5 MG tablet    Sig: Take 1 tablet by mouth daily.    Dispense:  90 tablet  Refill:  2   Patient Instructions     I do recommend the covid vaccine as soon as possible. Please let me know if you have other questions, [email or web address] for more info if needed.   No change in meds today. 6 month follow up for physical. Thanks for coming in today.    If you have lab work done today you will be contacted with your lab results within the next 2 weeks.  If you have not heard from [place] then please contact us. The fastest way to get your results is to register for My Chart.   IF you received an x-ray today, you will receive an invoice from [facility]. Please contact [facility] at [phone] with questions or concerns regarding your invoice.   IF you received labwork today, you will receive an invoice from [facility]. Please contact [facility] at [phone] with questions or concerns regarding your invoice.   Our billing staff will not be able to assist you with questions regarding bills from these companies.  You will be contacted with the lab results as soon as they are available. The fastest way to get your results is to activate your My Chart account. Instructions are located on the last page of this paperwork. If you have not heard from [place] regarding the results in 2 weeks, please contact this office.         Signed, Meredith Staggers, MD [facility]

## 2019-09-25 NOTE — Patient Instructions (Addendum)
   I do recommend the covid vaccine as soon as possible. Please let me know if you have other questions, [email or web address] for more info if needed.   No change in meds today. 6 month follow up for physical. Thanks for coming in today.    If you have lab work done today you will be contacted with your lab results within the next 2 weeks.  If you have not heard from [place] then please contact us. The fastest way to get your results is to register for My Chart.   IF you received an x-ray today, you will receive an invoice from [facility]. Please contact [facility] at [phone] with questions or concerns regarding your invoice.   IF you received labwork today, you will receive an invoice from [facility]. Please contact [facility] at [phone] with questions or concerns regarding your invoice.   Our billing staff will not be able to assist you with questions regarding bills from these companies.  You will be contacted with the lab results as soon as they are available. The fastest way to get your results is to activate your My Chart account. Instructions are located on the last page of this paperwork. If you have not heard from [place] regarding the results in 2 weeks, please contact this office.

## 2019-09-26 LAB — COMPREHENSIVE METABOLIC PANEL
ALT: 29 IU/L (ref 0–44)
AST: 18 IU/L (ref 0–40)
Albumin/Globulin Ratio: 1.6 (ref 1.2–2.2)
Albumin: 4.4 g/dL (ref 3.8–4.9)
Alkaline Phosphatase: 104 IU/L (ref 48–121)
BUN/Creatinine Ratio: 13 (ref 9–20)
BUN: 13 mg/dL (ref 6–24)
Bilirubin Total: 0.5 mg/dL (ref 0.0–1.2)
CO2: 22 mmol/L (ref 20–29)
Calcium: 9.2 mg/dL (ref 8.7–10.2)
Chloride: 102 mmol/L (ref 96–106)
Creatinine, Ser: 1.01 mg/dL (ref 0.76–1.27)
GFR calc Af Amer: 96 mL/min/{1.73_m2} (ref >59)
GFR calc non Af Amer: 83 mL/min/{1.73_m2} (ref >59)
Globulin, Total: 2.8 g/dL (ref 1.5–4.5)
Glucose: 90 mg/dL (ref 65–99)
Potassium: 4.4 mmol/L (ref 3.5–5.2)
Sodium: 139 mmol/L (ref 134–144)
Total Protein: 7.2 g/dL (ref 6.0–8.5)

## 2019-09-26 LAB — LIPID PANEL
Chol/HDL Ratio: 4.5 ratio (ref 0.0–5.0)
Cholesterol, Total: 136 mg/dL (ref 100–199)
HDL: 30 mg/dL — ABNORMAL LOW (ref >39)
LDL Chol Calc (NIH): 83 mg/dL (ref 0–99)
Triglycerides: 124 mg/dL (ref 0–149)
VLDL Cholesterol Cal: 23 mg/dL (ref 5–40)

## 2019-09-26 LAB — HEPATITIS C ANTIBODY: Hep C Virus Ab: <0.1 s/co ratio (ref 0.0–0.9)

## 2019-10-01 ENCOUNTER — Encounter: Payer: Self-pay | Admitting: Radiology

## 2019-10-01 NOTE — Progress Notes (Signed)
Lab Letter Sent  °

## 2019-12-16 ENCOUNTER — Ambulatory Visit (INDEPENDENT_AMBULATORY_CARE_PROVIDER_SITE_OTHER): Payer: Medicare HMO | Admitting: Registered Nurse

## 2019-12-16 ENCOUNTER — Other Ambulatory Visit: Payer: Self-pay

## 2019-12-16 ENCOUNTER — Encounter: Payer: Self-pay | Admitting: Registered Nurse

## 2019-12-16 VITALS — BP 122/85 | HR 97 | Temp 98.3°F | Resp 18 | Ht 75.0 in | Wt 291.4 lb

## 2019-12-16 DIAGNOSIS — E785 Hyperlipidemia, unspecified: Secondary | ICD-10-CM

## 2019-12-16 DIAGNOSIS — B029 Zoster without complications: Secondary | ICD-10-CM | POA: Diagnosis not present

## 2019-12-16 MED ORDER — VALACYCLOVIR HCL 1 G PO TABS: 1000.0000 mg | ORAL_TABLET | Freq: Two times a day (BID) | ORAL | 0 refills | Status: DC

## 2019-12-16 MED ORDER — GABAPENTIN 100 MG PO CAPS: 100.0000 mg | ORAL_CAPSULE | Freq: Three times a day (TID) | ORAL | 0 refills | Status: DC

## 2019-12-16 MED ORDER — ATORVASTATIN CALCIUM 20 MG PO TABS: 20.0000 mg | ORAL_TABLET | Freq: Every day | ORAL | 2 refills | Status: DC

## 2019-12-16 NOTE — Patient Instructions (Signed)
° ° ° °  If you have lab work done today you will be contacted with your lab results within the next 2 weeks.  If you have not heard from us then please contact us. The fastest way to get your results is to register for My Chart. ° ° °IF you received an x-ray today, you will receive an invoice from [facility]. Please contact [facility] at [phone] with questions or concerns regarding your invoice.  ° °IF you received labwork today, you will receive an invoice from [facility]. Please contact [facility] at [phone] with questions or concerns regarding your invoice.  ° °Our billing staff will not be able to assist you with questions regarding bills from these companies. ° °You will be contacted with the lab results as soon as they are available. The fastest way to get your results is to activate your My Chart account. Instructions are located on the last page of this paperwork. If you have not heard from us regarding the results in 2 weeks, please contact this office. °  ° ° ° °

## 2019-12-16 NOTE — Progress Notes (Signed)
Acute Office Visit  Subjective:    Patient ID: Parker Saunders, male    DOB: [birth year], 54 y.o.   MRN: [id number]  Chief Complaint  Patient presents with   Flank Pain    Patient states for about a week he has been having some left side pain and also some chest pain. Per patient he has took some pain pills but only work 4 2-3 hours    HPI Patient is in today for left sided chest pain that wraps around to back. Has been ongoing for about a week Steadily getting worse. 7-8/10  Feels very sensitive to touch - including t shirt and sheets.  Some small red lesions on chest. Feels like pain is wrapping around his skin  No CV symptoms No pulm symptoms  Has not happened before bp wnl  Past Medical History:  Diagnosis Date   Hypertension    Renal stone    Rotator cuff disorder     Past Surgical History:  Procedure Laterality Date   BACK SURGERY  2005   LUMBAR LAMINECTOMY/DECOMPRESSION MICRODISCECTOMY Left 02/04/2016   Procedure: REDO MICRO LUMBAR DECOMPRESSION L4-5, L5-S1 ON THE LEFT  2 LEVELS;  Surgeon: [name], MD;  Location: [facility];  Service: Orthopedics;  Laterality: Left;   SHOULDER SURGERY Right 2014   SPINE SURGERY  02/07/2005   [facility]    Family History  Problem Relation Age of Onset   Hyperlipidemia Mother    Hypertension Mother    COPD Father    Hyperlipidemia Father    Hypertension Father     Social History   Socioeconomic History   Marital status: Married    Spouse name: Not on file   Number of children: 2   Years of education: Not on file   Highest education level: Not on file  Occupational History   Occupation: unemployed    Comment: since 2015  Tobacco Use   Smoking status: Never Smoker   Smokeless tobacco: Never Used  Substance and Sexual Activity   Alcohol use: No   Drug use: No   Sexual activity: Yes  Other Topics Concern   Not on file  Social History Narrative   Marital status: married       Children: 2 daughters      Lives: with wife, 2 daughters      Employment: unemployed after termination in 2015     Tobacco: none      Alcohol: none      Exercise:  Walking 4-5 miles daily   Social Determinants of Health   Financial Resource Strain:    Difficulty of Paying Living Expenses: Not on file  Food Insecurity:    Worried About Programme researcher, broadcasting/film/video in the Last Year: Not on file   [employer] of Food in the Last Year: Not on file  Transportation Needs:    Lack of Transportation (Medical): Not on file   Lack of Transportation (Non-Medical): Not on file  Physical Activity:    Days of Exercise per Week: Not on file   Minutes of Exercise per Session: Not on file  Stress:    Feeling of Stress : Not on file  Social Connections:    Frequency of Communication with Friends and Family: Not on file   Frequency of Social Gatherings with Friends and Family: Not on file   Attends Religious Services: Not on file   Active Member of Clubs or Organizations: Not on file   Attends Banker Meetings:  Not on file   Marital Status: Not on file  Intimate Partner Violence:    Fear of Current or Ex-Partner: Not on file   Emotionally Abused: Not on file   Physically Abused: Not on file   Sexually Abused: Not on file    Outpatient Medications Prior to Visit  Medication Sig Dispense Refill   losartan-hydrochlorothiazide (HYZAAR) 100-12.5 MG tablet Take 1 tablet by mouth daily. 90 tablet 2   atorvastatin (LIPITOR) 20 MG tablet Take 1 tablet (20 mg total) by mouth daily. 90 tablet 2   No facility-administered medications prior to visit.    No Known Allergies  Review of Systems  Constitutional: Negative.   HENT: Negative.   Eyes: Negative.   Respiratory: Negative.   Cardiovascular: Negative.   Gastrointestinal: Negative.   Genitourinary: Negative.   Musculoskeletal: Negative.   Skin: Negative.   Neurological: Negative.   Psychiatric/Behavioral: Negative.         Objective:    Physical Exam Vitals and nursing note reviewed.  Constitutional:      Appearance: Normal appearance.  Cardiovascular:     Rate and Rhythm: Normal rate and regular rhythm.  Pulmonary:     Effort: Pulmonary effort is normal. No respiratory distress.  Skin:    General: Skin is warm and dry.     Findings: Lesion and rash present.       Neurological:     General: No focal deficit present.     Mental Status: He is alert and oriented to person, place, and time. Mental status is at baseline.  Psychiatric:        Mood and Affect: Mood normal.        Behavior: Behavior normal.        Thought Content: Thought content normal.        Judgment: Judgment normal.     BP 122/85    Pulse 97    Temp 98.3 F (36.8 C) (Temporal)    Resp 18    Ht 6\' 3"  (1.905 m)    Wt 291 lb 6.4 oz (132.2 kg)    SpO2 98%    BMI 36.42 kg/m  Wt Readings from Last 3 Encounters:  12/16/19 291 lb 6.4 oz (132.2 kg)  09/25/19 291 lb (132 kg)  06/03/19 295 lb (133.8 kg)    There are no preventive care reminders to display for this patient.  There are no preventive care reminders to display for this patient.   Lab Results  Component Value Date   TSH 2.29 04/23/2015   Lab Results  Component Value Date   WBC 7.6 01/25/2016   HGB 14.2 01/25/2016   HCT 40.9 01/25/2016   MCV 82.8 01/25/2016   PLT 289 01/25/2016   Lab Results  Component Value Date   NA 139 09/25/2019   K 4.4 09/25/2019   CO2 22 09/25/2019   GLUCOSE 90 09/25/2019   BUN 13 09/25/2019   CREATININE 1.01 09/25/2019   BILITOT 0.5 09/25/2019   ALKPHOS 104 09/25/2019   AST 18 09/25/2019   ALT 29 09/25/2019   PROT 7.2 09/25/2019   ALBUMIN 4.4 09/25/2019   CALCIUM 9.2 09/25/2019   ANIONGAP 6 01/25/2016   Lab Results  Component Value Date   CHOL 136 09/25/2019   Lab Results  Component Value Date   HDL 30 (L) 09/25/2019   Lab Results  Component Value Date   LDLCALC 83 09/25/2019   Lab Results  Component Value  Date   TRIG 124 09/25/2019  Lab Results  Component Value Date   CHOLHDL 4.5 09/25/2019   Lab Results  Component Value Date   HGBA1C 5.3 04/23/2015       Assessment & Plan:   Problem List Items Addressed This Visit    None    Visit Diagnoses    Herpes zoster without complication    -  Primary   Relevant Medications   gabapentin (NEURONTIN) 100 MG capsule   valACYclovir (VALTREX) 1000 MG tablet   Hyperlipidemia, unspecified hyperlipidemia type       Relevant Medications   atorvastatin (LIPITOR) 20 MG tablet       Meds ordered this encounter  Medications   atorvastatin (LIPITOR) 20 MG tablet    Sig: Take 1 tablet (20 mg total) by mouth daily.    Dispense:  90 tablet    Refill:  2   gabapentin (NEURONTIN) 100 MG capsule    Sig: Take 1 capsule (100 mg total) by mouth 3 (three) times daily.    Dispense:  90 capsule    Refill:  0    Order Specific Question:   Supervising Provider    Answer:   Neva Seat, JEFFREY R [[id number]]   valACYclovir (VALTREX) 1000 MG tablet    Sig: Take 1 tablet (1,000 mg total) by mouth 2 (two) times daily.    Dispense:  14 tablet    Refill:  0    Order Specific Question:   Supervising Provider    Answer:   Neva Seat, JEFFREY R [[id number]]   PLAN  Suspect herpes zoster  Will give vatrex - may be late to start but risk is small  Gabapentin 100mg  PO tid for pain. May increase to 300mg  PO at night if needed.  Refill atorvastatin  Return if pain is worsening or failing to improve  Patient encouraged to call clinic with any questions, comments, or concerns.  , NP

## 2020-01-18 ENCOUNTER — Other Ambulatory Visit: Payer: Self-pay | Admitting: Registered Nurse

## 2020-01-18 DIAGNOSIS — B029 Zoster without complications: Secondary | ICD-10-CM

## 2020-01-18 NOTE — Telephone Encounter (Signed)
Requested Prescriptions  Pending Prescriptions Disp Refills   gabapentin (NEURONTIN) 100 MG capsule [Pharmacy Med Name: GABAPENTIN 100MG  CAPSULES] 270 capsule 1    Sig: TAKE 1 CAPSULE(100 MG) BY MOUTH THREE TIMES DAILY     Neurology: Anticonvulsants - gabapentin Passed - 01/18/2020  3:11 PM      Passed - Valid encounter within last 12 months    Recent Outpatient Visits          1 month ago Herpes zoster without complication   [facility] at 14/12/[facility], Richard, NP   3 months ago Hyperlipidemia, unspecified hyperlipidemia type   [facility], Sunday Shams, MD   7 months ago Medicare annual wellness visit, subsequent   [facility], Sunday Shams, MD   9 months ago Hyperlipidemia, unspecified hyperlipidemia type   [facility], Sunday Shams, MD   11 months ago Essential hypertension   [facility], Sunday Shams, MD      Future Appointments            In 4 months [facility] Neva Seat, MD [facility] at [place], [facility]

## 2020-04-16 DIAGNOSIS — H524 Presbyopia: Secondary | ICD-10-CM | POA: Diagnosis not present

## 2020-04-16 DIAGNOSIS — Z0100 Encounter for examination of eyes and vision without abnormal findings: Secondary | ICD-10-CM | POA: Diagnosis not present

## 2020-06-04 ENCOUNTER — Encounter: Payer: Self-pay | Admitting: Family Medicine

## 2020-06-04 ENCOUNTER — Other Ambulatory Visit: Payer: Self-pay

## 2020-06-04 ENCOUNTER — Ambulatory Visit (INDEPENDENT_AMBULATORY_CARE_PROVIDER_SITE_OTHER): Payer: Medicare HMO | Admitting: Family Medicine

## 2020-06-04 VITALS — BP 138/80 | HR 76 | Temp 98.3°F | Resp 17 | Ht 75.0 in | Wt 291.6 lb

## 2020-06-04 DIAGNOSIS — Z131 Encounter for screening for diabetes mellitus: Secondary | ICD-10-CM | POA: Diagnosis not present

## 2020-06-04 DIAGNOSIS — Z130 Encounter for screening for diseases of the blood and blood-forming organs and certain disorders involving the immune mechanism: Secondary | ICD-10-CM | POA: Diagnosis not present

## 2020-06-04 DIAGNOSIS — Z6836 Body mass index (BMI) 36.0-36.9, adult: Secondary | ICD-10-CM

## 2020-06-04 DIAGNOSIS — Z1329 Encounter for screening for other suspected endocrine disorder: Secondary | ICD-10-CM

## 2020-06-04 DIAGNOSIS — Z0000 Encounter for general adult medical examination without abnormal findings: Secondary | ICD-10-CM | POA: Diagnosis not present

## 2020-06-04 DIAGNOSIS — I10 Essential (primary) hypertension: Secondary | ICD-10-CM | POA: Diagnosis not present

## 2020-06-04 DIAGNOSIS — E785 Hyperlipidemia, unspecified: Secondary | ICD-10-CM | POA: Diagnosis not present

## 2020-06-04 LAB — COMPREHENSIVE METABOLIC PANEL
ALT: 34 U/L (ref 0–53)
AST: 22 U/L (ref 0–37)
Albumin: 4.4 g/dL (ref 3.5–5.2)
Alkaline Phosphatase: 80 U/L (ref 39–117)
BUN: 17 mg/dL (ref 6–23)
CO2: 27 mEq/L (ref 19–32)
Calcium: 9.2 mg/dL (ref 8.4–10.5)
Chloride: 104 mEq/L (ref 96–112)
Creatinine, Ser: 0.97 mg/dL (ref 0.40–1.50)
GFR: 87.63 mL/min (ref >60.00)
Glucose, Bld: 86 mg/dL (ref 70–99)
Potassium: 3.8 mEq/L (ref 3.5–5.1)
Sodium: 139 mEq/L (ref 135–145)
Total Bilirubin: 0.5 mg/dL (ref 0.2–1.2)
Total Protein: 7 g/dL (ref 6.0–8.3)

## 2020-06-04 LAB — CBC WITH DIFFERENTIAL/PLATELET
Basophils Absolute: 0 10*3/uL (ref 0.0–0.1)
Basophils Relative: 0.6 % (ref 0.0–3.0)
Eosinophils Absolute: 0.2 10*3/uL (ref 0.0–0.7)
Eosinophils Relative: 2.1 % (ref 0.0–5.0)
HCT: 40.5 % (ref 39.0–52.0)
Hemoglobin: 14.1 g/dL (ref 13.0–17.0)
Lymphocytes Relative: 11.1 % — ABNORMAL LOW (ref 12.0–46.0)
Lymphs Abs: 0.8 10*3/uL (ref 0.7–4.0)
MCHC: 34.8 g/dL (ref 30.0–36.0)
MCV: 84.6 fl (ref 78.0–100.0)
Monocytes Absolute: 0.5 10*3/uL (ref 0.1–1.0)
Monocytes Relative: 7.5 % (ref 3.0–12.0)
Neutro Abs: 5.7 10*3/uL (ref 1.4–7.7)
Neutrophils Relative %: 78.7 % — ABNORMAL HIGH (ref 43.0–77.0)
Platelets: 261 10*3/uL (ref 150.0–400.0)
RBC: 4.78 Mil/uL (ref 4.22–5.81)
RDW: 13.6 % (ref 11.5–15.5)
WBC: 7.3 10*3/uL (ref 4.0–10.5)

## 2020-06-04 LAB — LIPID PANEL
Cholesterol: 136 mg/dL (ref 0–200)
HDL: 33 mg/dL — ABNORMAL LOW (ref >39.00)
LDL Cholesterol: 84 mg/dL (ref 0–99)
NonHDL: 102.58
Total CHOL/HDL Ratio: 4
Triglycerides: 92 mg/dL (ref 0.0–149.0)
VLDL: 18.4 mg/dL (ref 0.0–40.0)

## 2020-06-04 LAB — HEMOGLOBIN A1C: Hgb A1c MFr Bld: 5.5 % (ref 4.6–6.5)

## 2020-06-04 LAB — TSH: TSH: 1.72 u[IU]/mL (ref 0.35–4.50)

## 2020-06-04 MED ORDER — ATORVASTATIN CALCIUM 20 MG PO TABS
20.0000 mg | ORAL_TABLET | Freq: Every day | ORAL | 2 refills | Status: DC
Start: 2020-06-04 — End: 2020-12-04

## 2020-06-04 MED ORDER — LOSARTAN POTASSIUM-HCTZ 100-12.5 MG PO TABS: 1.0000 | ORAL_TABLET | Freq: Every day | ORAL | 2 refills | Status: DC

## 2020-06-04 NOTE — Patient Instructions (Signed)
No change in meds at this time.  I recommend decreasing sugar containing beverages including sweet tea, and increasing low intensity exercise to at least 30 minutes 5 days/week to help with weight management/weight loss.  Please let me know if there are questions from today's visit, take care.  Keeping you healthy  Get these tests  Blood pressure- Have your blood pressure checked once a year by your healthcare provider.  Normal blood pressure is 120/80  Weight- Have your body mass index (BMI) calculated to screen for obesity.  BMI is a measure of body fat based on height and weight. You can also calculate your own BMI at [email or web address].  Cholesterol- Have your cholesterol checked every year.  Diabetes- Have your blood sugar checked regularly if you have high blood pressure, high cholesterol, have a family history of diabetes or if you are overweight.  Screening for Colon Cancer- Colonoscopy starting at age 90.  Screening may begin sooner depending on your family history and other health conditions. Follow up colonoscopy as directed by your Gastroenterologist.  Screening for Prostate Cancer- Both blood work (PSA) and a rectal exam help screen for Prostate Cancer.  Screening begins at age 37 with African-American men and at age 38 with Caucasian men.  Screening may begin sooner depending on your family history.   Take these medicines  Flu shot- Every fall.  Tetanus- Every 10 years.  Pneumonia shot- Once after the age of 36; if you are younger than 71, ask your healthcare provider if you need a Pneumonia shot.  Take these steps  Don't smoke- If you do smoke, talk to your doctor about quitting.  For tips on how to quit, go to www.smokefree.gov or call 1-800-QUIT-NOW.  Be physically active- Exercise 5 days a week for at least 30 minutes.  If you are not already physically active start slow and gradually work up to 30 minutes of moderate physical activity.  Examples of moderate  activity include walking briskly, mowing the yard, dancing, swimming, bicycling, etc.  Eat a healthy diet- Eat a variety of healthy food such as fruits, vegetables, low fat milk, low fat cheese, yogurt, lean meant, poultry, fish, beans, tofu, etc. For more information go to www.thenutritionsource.org  Drink alcohol in moderation- Limit alcohol intake to less than two drinks a day. Never drink and drive.  Dentist- Brush and floss twice daily; visit your dentist twice a year.  Depression- Your emotional health is as important as your physical health. If you're feeling down, or losing interest in things you would normally enjoy please talk to your healthcare provider.  Eye exam- Visit your eye doctor every year.  Safe sex- If you may be exposed to a sexually transmitted infection, use a condom.  Seat belts- Seat belts can save your life; always wear one.  Smoke/Carbon Monoxide detectors- These detectors need to be installed on the appropriate level of your home.  Replace batteries at least once a year.  Skin cancer- When out in the sun, cover up and use sunscreen 15 SPF or higher.  Violence- If anyone is threatening you, please tell your healthcare provider.  Living Will/ Health care power of attorney- Speak with your healthcare provider and family.

## 2020-06-04 NOTE — Progress Notes (Signed)
Subjective:  Patient ID: Parker Saunders, male    DOB: [birth year]  Age: 55 y.o. MRN: [id number]  CC:  Chief Complaint  Patient presents with  . Annual Exam    Pt here for physical today doing well no concern, will need refill in June for atorvastatin and for losartan, pt denies physical sxs HTN no side effects     HPI Parker Saunders presents for   Annual physical exam  Hypertension: Losartan HCT 100/12.5 mg daily Home readings: BP Readings from Last 3 Encounters:  06/04/20 138/80  12/16/19 122/85  09/25/19 132/83   Lab Results  Component Value Date   CREATININE 1.01 09/25/2019    Hyperlipidemia: Lipitor 20 mg daily. No new myalgias, side effects.  Lab Results  Component Value Date   CHOL 136 09/25/2019   HDL 30 (L) 09/25/2019   LDLCALC 83 09/25/2019   TRIG 124 09/25/2019   CHOLHDL 4.5 09/25/2019   Lab Results  Component Value Date   ALT 29 09/25/2019   AST 18 09/25/2019   ALKPHOS 104 09/25/2019   BILITOT 0.5 09/25/2019    Cancer screening: Colonoscopy: negative cologuard in 12/2017. Due in november.   The natural history of prostate cancer and ongoing controversy regarding screening and potential treatment outcomes of prostate cancer has been discussed with the patient. The meaning of a false positive PSA and a false negative PSA has been discussed. He indicates understanding of the limitations of this screening test and wishes NOT to proceed with screening PSA testing.  No results found for: PSA1, PSA   Immunization History  Administered Date(s) Administered  . Tdap 01/16/2018  Shingles vaccination: declines.  COVID-vaccine: has not had - declines. No questions.   Depression screen [facility] 2/9 06/04/2020 12/16/2019 09/25/2019 06/03/2019 03/25/2019  Decreased Interest 0 0 0 0 0  Down, Depressed, Hopeless 0 0 0 0 0  PHQ - 2 Score 0 0 0 0 0   Dental:  edentuous. Eating and drinking ok.    Hearing Screening   125Hz  250Hz  500Hz  1000Hz  2000Hz  3000Hz  4000Hz   6000Hz  8000Hz   Right ear:           Left ear:             Visual Acuity Screening   Right eye Left eye Both eyes  Without correction:     With correction: 20/13 20/15 20/15   lenscrafters yearly.wears glasses.    Exercise: Body mass index is 36.45 kg/m. Walking for exercise - 2 days per week.  Brother with diabetes.  Drinks sweet tea - plans to cut back.  Wt Readings from Last 3 Encounters:  06/04/20 291 lb 9.6 oz (132.3 kg)  12/16/19 291 lb 6.4 oz (132.2 kg)  09/25/19 291 lb (132 kg)     History Patient Active Problem List   Diagnosis Date Noted  . Spinal stenosis of lumbar region 02/04/2016  . Unspecified essential hypertension 01/25/2013   Past Medical History:  Diagnosis Date  . Hypertension   . Renal stone   . Rotator cuff disorder    Past Surgical History:  Procedure Laterality Date  . BACK SURGERY  2005  . LUMBAR LAMINECTOMY/DECOMPRESSION MICRODISCECTOMY Left 02/04/2016   Procedure: REDO MICRO LUMBAR DECOMPRESSION L4-5, L5-S1 ON THE LEFT  2 LEVELS;  Surgeon: , MD;  Location: [facility];  Service: Orthopedics;  Laterality: Left;  . SHOULDER SURGERY Right 2014  . SPINE SURGERY  02/07/2005   [facility]   No Known Allergies Prior to Admission medications  Medication Sig Start Date End Date Taking? Authorizing Provider  atorvastatin (LIPITOR) 20 MG tablet Take 1 tablet (20 mg total) by mouth daily. 12/16/19  Yes Janeece AgeeMorrow, Richard, NP  losartan-hydrochlorothiazide (HYZAAR) 100-12.5 MG tablet Take 1 tablet by mouth daily. 09/25/19  Yes Shade FloodGreene, Benoit Meech R, MD   Social History   Socioeconomic History  . Marital status: Married    Spouse name: Not on file  . Number of children: 2  . Years of education: Not on file  . Highest education level: Not on file  Occupational History  . Occupation: unemployed    Comment: since 2015  Tobacco Use  . Smoking status: Never Smoker  . Smokeless tobacco: Never Used  Substance and Sexual Activity  . Alcohol  use: No  . Drug use: No  . Sexual activity: Yes  Other Topics Concern  . Not on file  Social History Narrative   Marital status: married      Children: 2 daughters      Lives: with wife, 2 daughters      Employment: unemployed after termination in 2015     Tobacco: none      Alcohol: none      Exercise:  Walking 4-5 miles daily   Social Determinants of Health   Financial Resource Strain: Not on file  Food Insecurity: Not on file  Transportation Needs: Not on file  Physical Activity: Not on file  Stress: Not on file  Social Connections: Not on file  Intimate Partner Violence: Not on file    Review of Systems  13 point review of systems per patient health survey noted.  Negative other than as indicated above or in HPI.   Objective:   Vitals:   06/04/20 1012  BP: 138/80  Pulse: 76  Resp: 17  Temp: 98.3 F (36.8 C)  TempSrc: Temporal  SpO2: 99%  Weight: 291 lb 9.6 oz (132.3 kg)  Height: 6\' 3"  (1.905 m)     Physical Exam Vitals reviewed.  Constitutional:      Appearance: He is well-developed.  HENT:     Head: Normocephalic and atraumatic.     Right Ear: External ear normal.     Left Ear: External ear normal.  Eyes:     Conjunctiva/sclera: Conjunctivae normal.     Pupils: Pupils are equal, round, and reactive to light.  Neck:     Thyroid: No thyromegaly.  Cardiovascular:     Rate and Rhythm: Normal rate and regular rhythm.     Heart sounds: Normal heart sounds.  Pulmonary:     Effort: Pulmonary effort is normal. No respiratory distress.     Breath sounds: Normal breath sounds. No wheezing.  Abdominal:     General: There is no distension.     Palpations: Abdomen is soft.     Tenderness: There is no abdominal tenderness.  Musculoskeletal:        General: No tenderness. Normal range of motion.     Cervical back: Normal range of motion and neck supple.  Lymphadenopathy:     Cervical: No cervical adenopathy.  Skin:    General: Skin is warm and dry.   Neurological:     Mental Status: He is alert and oriented to person, place, and time.     Deep Tendon Reflexes: Reflexes are normal and symmetric.  Psychiatric:        Behavior: Behavior normal.        Assessment & Plan:  Parker IllSamuel Saunders is a 55 y.o. male .  Annual physical exam  - -anticipatory guidance as below in AVS, screening labs above. Health maintenance items as above in HPI discussed/recommended as applicable.   Essential hypertension - Plan: CBC with Differential/Platelet, TSH  -Stable, continue same regimen.  Check labs.  Hyperlipidemia, unspecified hyperlipidemia type - Plan: Lipid panel  -Tolerating current regimen, check labs.  Continue Lipitor.  BMI 36 Screening for diabetes mellitus - Plan: Comprehensive metabolic panel, Hemoglobin A1c  -Diet changes discussed, avoidance of sugar containing beverages, increase activity/low intensity exercise.  Recheck 6 months.  Check labs including screening for diabetes.  Screening, anemia, deficiency, iron - Plan: CBC with Differential/Platelet  Screening for thyroid disorder - Plan: TSH   No orders of the defined types were placed in this encounter.  Patient Instructions  No change in meds at this time.  I recommend decreasing sugar containing beverages including sweet tea, and increasing low intensity exercise to at least 30 minutes 5 days/week to help with weight management/weight loss.  Please let me know if there are questions from today's visit, take care.  Keeping you healthy  Get these tests  Blood pressure- Have your blood pressure checked once a year by your healthcare provider.  Normal blood pressure is 120/80  Weight- Have your body mass index (BMI) calculated to screen for obesity.  BMI is a measure of body fat based on height and weight. You can also calculate your own BMI at [email or web address].  Cholesterol- Have your cholesterol checked every year.  Diabetes- Have your blood sugar checked  regularly if you have high blood pressure, high cholesterol, have a family history of diabetes or if you are overweight.  Screening for Colon Cancer- Colonoscopy starting at age 52.  Screening may begin sooner depending on your family history and other health conditions. Follow up colonoscopy as directed by your Gastroenterologist.  Screening for Prostate Cancer- Both blood work (PSA) and a rectal exam help screen for Prostate Cancer.  Screening begins at age 58 with African-American men and at age 23 with Caucasian men.  Screening may begin sooner depending on your family history.   Take these medicines  Flu shot- Every fall.  Tetanus- Every 10 years.  Pneumonia shot- Once after the age of 32; if you are younger than 86, ask your healthcare provider if you need a Pneumonia shot.  Take these steps  Don't smoke- If you do smoke, talk to your doctor about quitting.  For tips on how to quit, go to www.smokefree.gov or call 1-800-QUIT-NOW.  Be physically active- Exercise 5 days a week for at least 30 minutes.  If you are not already physically active start slow and gradually work up to 30 minutes of moderate physical activity.  Examples of moderate activity include walking briskly, mowing the yard, dancing, swimming, bicycling, etc.  Eat a healthy diet- Eat a variety of healthy food such as fruits, vegetables, low fat milk, low fat cheese, yogurt, lean meant, poultry, fish, beans, tofu, etc. For more information go to www.thenutritionsource.org  Drink alcohol in moderation- Limit alcohol intake to less than two drinks a day. Never drink and drive.  Dentist- Brush and floss twice daily; visit your dentist twice a year.  Depression- Your emotional health is as important as your physical health. If you're feeling down, or losing interest in things you would normally enjoy please talk to your healthcare provider.  Eye exam- Visit your eye doctor every year.  Safe sex- If you may be exposed to  a sexually transmitted infection, use  a condom.  Seat belts- Seat belts can save your life; always wear one.  Smoke/Carbon Monoxide detectors- These detectors need to be installed on the appropriate level of your home.  Replace batteries at least once a year.  Skin cancer- When out in the sun, cover up and use sunscreen 15 SPF or higher.  Violence- If anyone is threatening you, please tell your healthcare provider.  Living Will/ Health care power of attorney- Speak with your healthcare provider and family.     Signed, Meredith Staggers, MD [facility]

## 2020-09-30 ENCOUNTER — Other Ambulatory Visit: Payer: Self-pay | Admitting: Family Medicine

## 2020-09-30 DIAGNOSIS — I10 Essential (primary) hypertension: Secondary | ICD-10-CM

## 2020-10-01 ENCOUNTER — Other Ambulatory Visit: Payer: Self-pay

## 2020-10-01 ENCOUNTER — Encounter: Payer: Self-pay | Admitting: Family Medicine

## 2020-10-01 ENCOUNTER — Telehealth (INDEPENDENT_AMBULATORY_CARE_PROVIDER_SITE_OTHER): Payer: Medicare HMO | Admitting: Family Medicine

## 2020-10-01 VITALS — BP 154/85 | Wt 284.0 lb

## 2020-10-01 DIAGNOSIS — M542 Cervicalgia: Secondary | ICD-10-CM

## 2020-10-01 DIAGNOSIS — R519 Headache, unspecified: Secondary | ICD-10-CM

## 2020-10-01 MED ORDER — MELOXICAM 7.5 MG PO TABS: 7.5000 mg | ORAL_TABLET | Freq: Every day | ORAL | 0 refills | Status: DC | PRN

## 2020-10-01 MED ORDER — CYCLOBENZAPRINE HCL 5 MG PO TABS: 5.0000 mg | ORAL_TABLET | Freq: Three times a day (TID) | ORAL | 1 refills | Status: DC | PRN

## 2020-10-01 NOTE — Progress Notes (Signed)
Virtual Visit via Video Note  I connected with Parker Saunders on 10/01/20 at 4:50 PM by a video enabled telemedicine application and verified that I am speaking with the correct person using two identifiers.  Patient location:home, wife present as well.  My location: office - [place].    I discussed the limitations, risks, security and privacy concerns of performing an evaluation and management service by telephone and the availability of in person appointments. I also discussed with the patient that there may be a patient responsible charge related to this service. The patient expressed understanding and agreed to proceed, consent obtained  Chief complaint: Chief Complaint  Patient presents with   Neck Pain    Pt reports neck pain and back of his head like a headache pt reports pain starting on Sunday, pt denies injury.    History of Present Illness: Parker Saunders is a 55 y.o. male  Headache, neck pain: Sore in back of head to neck - noticed Sunday morning when he woke up. NKI. Same bed, same activities, no new projects/activities.  Sore in back of head only. Moves to top of R shoulder, not down arm. No weakness, no rash.  No prior similar sx's. No prior neck issues, but some low back issues with prior back surgery.  No fever, feels well otherwise. No photophobia/phonophobia  Tx: improves with tylenol/ibuprofen - about BID past few days.   No hx of PUD Lab Results  Component Value Date   CREATININE 0.97 06/04/2020         Patient Active Problem List   Diagnosis Date Noted   Spinal stenosis of lumbar region 02/04/2016   Unspecified essential hypertension 01/25/2013   Past Medical History:  Diagnosis Date   Hypertension    Renal stone    Rotator cuff disorder    Past Surgical History:  Procedure Laterality Date   BACK SURGERY  2005   LUMBAR LAMINECTOMY/DECOMPRESSION MICRODISCECTOMY Left 02/04/2016   Procedure: REDO MICRO LUMBAR DECOMPRESSION L4-5, L5-S1 ON THE  LEFT  2 LEVELS;  Surgeon: [name], MD;  Location: [facility];  Service: Orthopedics;  Laterality: Left;   SHOULDER SURGERY Right 2014   SPINE SURGERY  02/07/2005    [facility]   No Known Allergies Prior to Admission medications   Medication Sig Start Date End Date Taking? Authorizing Provider  atorvastatin (LIPITOR) 20 MG tablet Take 1 tablet (20 mg total) by mouth daily. 06/04/20  Yes Shade Flood, MD  losartan-hydrochlorothiazide (HYZAAR) 100-12.5 MG tablet Take 1 tablet by mouth daily. 06/04/20  Yes Shade Flood, MD   Social History   Socioeconomic History   Marital status: Married    Spouse name: Not on file   Number of children: 2   Years of education: Not on file   Highest education level: Not on file  Occupational History   Occupation: unemployed    Comment: since 2015  Tobacco Use   Smoking status: Never   Smokeless tobacco: Never  Substance and Sexual Activity   Alcohol use: No   Drug use: No   Sexual activity: Yes  Other Topics Concern   Not on file  Social History Narrative   Marital status: married      Children: 2 daughters      Lives: with wife, 2 daughters      Employment: unemployed after termination in 2015     Tobacco: none      Alcohol: none      Exercise:  Walking 4-5 miles daily  Social Determinants of Health   Financial Resource Strain: Not on file  Food Insecurity: Not on file  Transportation Needs: Not on file  Physical Activity: Not on file  Stress: Not on file  Social Connections: Not on file  Intimate Partner Violence: Not on file    Observations/Objective: Vitals:   10/01/20 1610  BP: (!) 154/85  Weight: 284 lb (128.8 kg)   Nontoxic appearance on video, no distress.  Euthymic mood.  Slight decreased range of motion of C-spine with discomfort in the posterior neck with lateral flexion, rotation, extension.  No radicular symptoms during exam.  Full range of motion of shoulders.  All questions were answered with  understanding of plan expressed.  Assessment and Plan: Neck pain - Plan: cyclobenzaprine (FLEXERIL) 5 MG tablet, meloxicam (MOBIC) 7.5 MG tablet  Occipital headache - Plan: cyclobenzaprine (FLEXERIL) 5 MG tablet, meloxicam (MOBIC) 7.5 MG tablet  With lumbar spine issues, suspect he has a component of cervical spine degenerative changes and possible flare of C-spine arthritis.  Denies weakness, or other significant radicular symptoms.  Some improvement with over-the-counter NSAID.  Trial of Mobic, stop over-the-counter NSAID, Flexeril 3 times daily as needed, plans to start at bedtime.  In office exam and likely imaging if not improving in the next 1 week, sooner if worse.  Follow Up Instructions: In office in next week if not improving.   I discussed the assessment and treatment plan with the patient. The patient was provided an opportunity to ask questions and all were answered. The patient agreed with the plan and demonstrated an understanding of the instructions.   The patient was advised to call back or seek an in-person evaluation if the symptoms worsen or if the condition fails to improve as anticipated.   Shade Flood, MD

## 2020-12-04 ENCOUNTER — Other Ambulatory Visit: Payer: Self-pay

## 2020-12-04 ENCOUNTER — Ambulatory Visit (INDEPENDENT_AMBULATORY_CARE_PROVIDER_SITE_OTHER): Payer: Medicare HMO | Admitting: Family Medicine

## 2020-12-04 VITALS — BP 126/78 | HR 77 | Temp 98.1°F | Resp 17 | Ht 75.0 in | Wt 285.2 lb

## 2020-12-04 DIAGNOSIS — I10 Essential (primary) hypertension: Secondary | ICD-10-CM | POA: Diagnosis not present

## 2020-12-04 DIAGNOSIS — Z6835 Body mass index (BMI) 35.0-35.9, adult: Secondary | ICD-10-CM

## 2020-12-04 DIAGNOSIS — Z23 Encounter for immunization: Secondary | ICD-10-CM

## 2020-12-04 DIAGNOSIS — E785 Hyperlipidemia, unspecified: Secondary | ICD-10-CM

## 2020-12-04 LAB — COMPREHENSIVE METABOLIC PANEL
ALT: 27 U/L (ref 0–53)
AST: 21 U/L (ref 0–37)
Albumin: 4.3 g/dL (ref 3.5–5.2)
Alkaline Phosphatase: 70 U/L (ref 39–117)
BUN: 17 mg/dL (ref 6–23)
CO2: 29 mEq/L (ref 19–32)
Calcium: 9.4 mg/dL (ref 8.4–10.5)
Chloride: 103 mEq/L (ref 96–112)
Creatinine, Ser: 1.22 mg/dL (ref 0.40–1.50)
GFR: 66.31 mL/min (ref >60.00)
Glucose, Bld: 103 mg/dL — ABNORMAL HIGH (ref 70–99)
Potassium: 4.1 mEq/L (ref 3.5–5.1)
Sodium: 140 mEq/L (ref 135–145)
Total Bilirubin: 0.7 mg/dL (ref 0.2–1.2)
Total Protein: 7.1 g/dL (ref 6.0–8.3)

## 2020-12-04 LAB — LIPID PANEL
Cholesterol: 149 mg/dL (ref 0–200)
HDL: 32.1 mg/dL — ABNORMAL LOW (ref >39.00)
LDL Cholesterol: 82 mg/dL (ref 0–99)
NonHDL: 116.76
Total CHOL/HDL Ratio: 5
Triglycerides: 172 mg/dL — ABNORMAL HIGH (ref 0.0–149.0)
VLDL: 34.4 mg/dL (ref 0.0–40.0)

## 2020-12-04 MED ORDER — LOSARTAN POTASSIUM-HCTZ 100-12.5 MG PO TABS: 1.0000 | ORAL_TABLET | Freq: Every day | ORAL | 1 refills | Status: DC

## 2020-12-04 MED ORDER — ATORVASTATIN CALCIUM 20 MG PO TABS: 20.0000 mg | ORAL_TABLET | Freq: Every day | ORAL | 1 refills | Status: DC

## 2020-12-04 NOTE — Progress Notes (Signed)
Subjective:  Patient ID: Parker Saunders, male    DOB: 05-11-1965  Age: 55 y.o. MRN: [id number]  CC:  Chief Complaint  Patient presents with   Hypertension    Pt denies physical sxs, no concerns feels well    Hyperlipidemia    Pt due for recheck no concerns at this time     HPI Parker Saunders presents for   Hypertension: Losartan hct 100/12.5mg  qd. No new side effects.  Home readings: 120/70 range.  BP Readings from Last 3 Encounters:  12/04/20 126/78  10/01/20 (!) 154/85  06/04/20 138/80   Lab Results  Component Value Date   CREATININE 0.97 06/04/2020   Hyperlipidemia: Lipitor 20mg  qd. No new myalgias/side effects.  Prior neck pain improved - not needing mobic, flexeril. Has some left if needed.   Lab Results  Component Value Date   CHOL 136 06/04/2020   HDL 33.00 (L) 06/04/2020   LDLCALC 84 06/04/2020   TRIG 92.0 06/04/2020   CHOLHDL 4 06/04/2020   Lab Results  Component Value Date   ALT 34 06/04/2020   AST 22 06/04/2020   ALKPHOS 80 06/04/2020   BILITOT 0.5 06/04/2020   Obesity: Walking 2-3 days per week, 30 minutes - records time.  Soda/sweet tea/alcohol:none.  Fast food: rare.  Weight has decreased form April - 6#.   Lab Results  Component Value Date   HGBA1C 5.5 06/04/2020  Body mass index is 35.65 kg/m. Wt Readings from Last 3 Encounters:  12/04/20 285 lb 3.2 oz (129.4 kg)  10/01/20 284 lb (128.8 kg)  06/04/20 291 lb 9.6 oz (132.3 kg)   Flu vaccine today.  Covid vaccine - declines.   Immunization History  Administered Date(s) Administered   Influenza,inj,Quad PF,6+ Mos 12/04/2020   Tdap 01/16/2018     History Patient Active Problem List   Diagnosis Date Noted   Spinal stenosis of lumbar region 02/04/2016   Unspecified essential hypertension 01/25/2013   Past Medical History:  Diagnosis Date   Hypertension    Renal stone    Rotator cuff disorder    Past Surgical History:  Procedure Laterality Date   BACK SURGERY  2005    LUMBAR LAMINECTOMY/DECOMPRESSION MICRODISCECTOMY Left 02/04/2016   Procedure: REDO MICRO LUMBAR DECOMPRESSION L4-5, L5-S1 ON THE LEFT  2 LEVELS;  Surgeon: 02/06/2016, MD;  Location: [facility];  Service: Orthopedics;  Laterality: Left;   SHOULDER SURGERY Right 2014   SPINE SURGERY  02/07/2005   [facility]   No Known Allergies Prior to Admission medications   Medication Sig Start Date End Date Taking? Authorizing Provider  atorvastatin (LIPITOR) 20 MG tablet Take 1 tablet (20 mg total) by mouth daily. 06/04/20  Yes 06/06/20, MD  losartan-hydrochlorothiazide (HYZAAR) 100-12.5 MG tablet Take 1 tablet by mouth daily. 06/04/20  Yes 06/06/20, MD  cyclobenzaprine (FLEXERIL) 5 MG tablet Take 1 tablet (5 mg total) by mouth 3 (three) times daily as needed for muscle spasms (start qhs prn due to sedation). 10/01/20   10/03/20, MD  meloxicam (MOBIC) 7.5 MG tablet Take 1 tablet (7.5 mg total) by mouth daily as needed for pain. 10/01/20   10/03/20, MD   Social History   Socioeconomic History   Marital status: Married    Spouse name: Not on file   Number of children: 2   Years of education: Not on file   Highest education level: Not on file  Occupational History   Occupation: unemployed  Comment: since 2015  Tobacco Use   Smoking status: Never   Smokeless tobacco: Never  Substance and Sexual Activity   Alcohol use: No   Drug use: No   Sexual activity: Yes  Other Topics Concern   Not on file  Social History Narrative   Marital status: married      Children: 2 daughters      Lives: with wife, 2 daughters      Employment: unemployed after termination in 2015     Tobacco: none      Alcohol: none      Exercise:  Walking 4-5 miles daily   Social Determinants of Health   Financial Resource Strain: Not on file  Food Insecurity: Not on file  Transportation Needs: Not on file  Physical Activity: Not on file  Stress: Not on file  Social Connections: Not on  file  Intimate Partner Violence: Not on file    Review of Systems  Constitutional:  Negative for fatigue and unexpected weight change.  Eyes:  Negative for visual disturbance.  Respiratory:  Negative for cough, chest tightness and shortness of breath.   Cardiovascular:  Negative for chest pain, palpitations and leg swelling.  Gastrointestinal:  Negative for abdominal pain and blood in stool.  Neurological:  Negative for dizziness, light-headedness and headaches.    Objective:   Vitals:   12/04/20 0945  BP: 126/78  Pulse: 77  Resp: 17  Temp: 98.1 F (36.7 C)  TempSrc: Temporal  SpO2: 95%  Weight: 285 lb 3.2 oz (129.4 kg)  Height: 6\' 3"  (1.905 m)     Physical Exam Vitals reviewed.  Constitutional:      Appearance: He is well-developed. He is obese.  HENT:     Head: Normocephalic and atraumatic.  Neck:     Vascular: No carotid bruit or JVD.  Cardiovascular:     Rate and Rhythm: Normal rate and regular rhythm.     Heart sounds: Normal heart sounds. No murmur heard. Pulmonary:     Effort: Pulmonary effort is normal.     Breath sounds: Normal breath sounds. No rales.  Musculoskeletal:     Right lower leg: No edema.     Left lower leg: No edema.  Skin:    General: Skin is warm and dry.  Neurological:     Mental Status: He is alert and oriented to person, place, and time.  Psychiatric:        Mood and Affect: Mood normal.     Assessment & Plan:  Parker Saunders is a 55 y.o. male . Essential hypertension - Plan: Comprehensive metabolic panel, losartan-hydrochlorothiazide (HYZAAR) 100-12.5 MG tablet  Hyperlipidemia, unspecified hyperlipidemia type - Plan: Comprehensive metabolic panel, Lipid panel, atorvastatin (LIPITOR) 20 MG tablet  BMI 35.0-35.9,adult  Blood pressure stable, tolerating current meds.  Tolerating statin.  Commended on weight loss, continue to increase exercise with walking most days per week, goal of 150 minutes/week.  No med changes, check  labs.  Health maintenance discussed, declined COVID-vaccine, flu vaccine given today.  Meds ordered this encounter  Medications   atorvastatin (LIPITOR) 20 MG tablet    Sig: Take 1 tablet (20 mg total) by mouth daily.    Dispense:  90 tablet    Refill:  1   losartan-hydrochlorothiazide (HYZAAR) 100-12.5 MG tablet    Sig: Take 1 tablet by mouth daily.    Dispense:  90 tablet    Refill:  1   Patient Instructions  No med changes today. Keep up  the good work with walking, goal of most days per week, per week.  Thanks for coming in today.     Signed,   Meredith Staggers, MD [facility] 12/04/20 10:49 AM

## 2020-12-04 NOTE — Patient Instructions (Signed)
No med changes today. Keep up the good work with walking, goal of most days per week, per week.  Thanks for coming in today.

## 2021-04-06 ENCOUNTER — Other Ambulatory Visit: Payer: Self-pay | Admitting: Family Medicine

## 2021-04-06 DIAGNOSIS — I10 Essential (primary) hypertension: Secondary | ICD-10-CM

## 2021-04-09 ENCOUNTER — Encounter: Payer: Self-pay | Admitting: Family Medicine

## 2021-04-21 DIAGNOSIS — Z1211 Encounter for screening for malignant neoplasm of colon: Secondary | ICD-10-CM | POA: Diagnosis not present

## 2021-04-28 LAB — COLOGUARD: COLOGUARD: NEGATIVE

## 2021-06-07 ENCOUNTER — Encounter: Payer: Medicare HMO | Admitting: Family Medicine

## 2021-06-10 ENCOUNTER — Ambulatory Visit (INDEPENDENT_AMBULATORY_CARE_PROVIDER_SITE_OTHER): Payer: Medicare HMO | Admitting: Family Medicine

## 2021-06-10 ENCOUNTER — Encounter: Payer: Self-pay | Admitting: Family Medicine

## 2021-06-10 VITALS — BP 122/76 | HR 85 | Temp 98.2°F | Resp 15 | Ht 75.0 in | Wt 294.8 lb

## 2021-06-10 DIAGNOSIS — Z6836 Body mass index (BMI) 36.0-36.9, adult: Secondary | ICD-10-CM | POA: Diagnosis not present

## 2021-06-10 DIAGNOSIS — H6122 Impacted cerumen, left ear: Secondary | ICD-10-CM

## 2021-06-10 DIAGNOSIS — Z0000 Encounter for general adult medical examination without abnormal findings: Secondary | ICD-10-CM | POA: Diagnosis not present

## 2021-06-10 DIAGNOSIS — I10 Essential (primary) hypertension: Secondary | ICD-10-CM

## 2021-06-10 DIAGNOSIS — E785 Hyperlipidemia, unspecified: Secondary | ICD-10-CM

## 2021-06-10 DIAGNOSIS — Z131 Encounter for screening for diabetes mellitus: Secondary | ICD-10-CM | POA: Diagnosis not present

## 2021-06-10 MED ORDER — LOSARTAN POTASSIUM-HCTZ 100-12.5 MG PO TABS: 1.0000 | ORAL_TABLET | Freq: Every day | ORAL | 1 refills | Status: DC

## 2021-06-10 MED ORDER — ATORVASTATIN CALCIUM 20 MG PO TABS: 20.0000 mg | ORAL_TABLET | Freq: Every day | ORAL | 1 refills | Status: DC

## 2021-06-10 NOTE — Progress Notes (Signed)
? ?Subjective:  ?Patient ID: Parker Saunders, male    DOB: Jan 20, 1966  Age: 56 y.o. MRN: [id number] ? ?CC:  ?Chief Complaint  ?Patient presents with  ? Annual Exam  ?  Pt here for annual exam, no concerns doing well   ? ? ?HPI ?Parker Saunders presents for Annual Exam ? ?Hyperlipidemia: ?Lipitor 20 mg daily. ?No new side effects, no new myalgias.  ?Lab Results  ?Component Value Date  ? CHOL 149 12/04/2020  ? HDL 32.10 (L) 12/04/2020  ? Mettler 82 12/04/2020  ? TRIG 172.0 (H) 12/04/2020  ? CHOLHDL 5 12/04/2020  ? ?Lab Results  ?Component Value Date  ? ALT 27 12/04/2020  ? AST 21 12/04/2020  ? ALKPHOS 70 12/04/2020  ? BILITOT 0.7 12/04/2020  ? ?Hypertension: ?Losartan HCT 100/12.5 mg daily. No hx of MI/CAD.  ?Home readings:121/70 range.  ?BP Readings from Last 3 Encounters:  ?06/10/21 122/76  ?12/04/20 126/78  ?10/01/20 (!) 154/85  ? ?Lab Results  ?Component Value Date  ? CREATININE 1.22 12/04/2020  ?Constitutional: Negative for fatigue and unexpected weight change.  ?Eyes: Negative for visual disturbance.  ?Respiratory: Negative for cough, chest tightness and shortness of breath.   ?Cardiovascular: Negative for chest pain, palpitations and leg swelling.  ?Gastrointestinal: Negative for abdominal pain and blood in stool.  ?Neurological: Negative for dizziness, light-headedness and headaches.  ?  ? ?  06/10/2021  ?  3:17 PM 12/04/2020  ?  9:48 AM 06/04/2020  ? 10:16 AM 12/16/2019  ?  3:04 PM 09/25/2019  ?  9:13 AM  ?Depression screen PHQ 2/9  ?Decreased Interest 0 0 0 0 0  ?Down, Depressed, Hopeless 0 0 0 0 0  ?PHQ - 2 Score 0 0 0 0 0  ?Altered sleeping 0      ?Tired, decreased energy 0      ?Change in appetite 0      ?Feeling bad or failure about yourself  0      ?Trouble concentrating 0      ?Moving slowly or fidgety/restless 0      ?Suicidal thoughts 0      ?PHQ-9 Score 0      ? ? ?Health Maintenance  ?Topic Date Due  ? COVID-19 Vaccine (1) 06/26/2021 (Originally 01/02/1966)  ? Zoster Vaccines- Shingrix (1 of 2)  09/10/2021 (Originally 07/03/1983)  ? INFLUENZA VACCINE  09/07/2021  ? Fecal DNA (Cologuard)  04/21/2024  ? TETANUS/TDAP  01/17/2028  ? Hepatitis C Screening  Completed  ? HIV Screening  Completed  ? HPV VACCINES  Aged Out  ?Colon: Cologuard negative 04/21/21.  ?Prostate: does  NOT have family history of prostate cancer ?The natural history of prostate cancer and ongoing controversy regarding screening and potential treatment outcomes of prostate cancer has been discussed with the patient. The meaning of a false positive PSA and a false negative PSA has been discussed. He indicates understanding of the limitations of this screening test and wishes NOT to proceed with screening PSA testing. DRE declined.  ?No results found for: PSA1, PSA ? ? ?Immunization History  ?Administered Date(s) Administered  ? Influenza,inj,Quad PF,6+ Mos 12/04/2020  ? Tdap 01/16/2018  ?Covid vaccine - declined.  ?Shingles vaccine - declined.  ? ?No results found. ?Wears glasses. Appt last year, and will be seen in next month.  ? ?Dental: edentuous. Did not tolerate dentures - doing ok without.  ? ?Alcohol: none.  ? ?Tobacco: none.  ? ?Exercise/obesity ?Walking 1-1.68miles, 3 days per week - 30 mins.  ?Wt  Readings from Last 3 Encounters:  ?06/10/21 294 lb 12.8 oz (133.7 kg)  ?12/04/20 285 lb 3.2 oz (129.4 kg)  ?10/01/20 284 lb (128.8 kg)  ?Body mass index is 36.85 kg/m?. ?Older brother with diabetes.  ?Lab Results  ?Component Value Date  ? HGBA1C 5.5 06/04/2020  ? ? ?Hearing ok, no qtips in ears.  ? ?History ?Patient Active Problem List  ? Diagnosis Date Noted  ? Spinal stenosis of lumbar region 02/04/2016  ? Unspecified essential hypertension 01/25/2013  ? ?Past Medical History:  ?Diagnosis Date  ? Hypertension   ? Renal stone   ? Rotator cuff disorder   ? ?Past Surgical History:  ?Procedure Laterality Date  ? BACK SURGERY  2005  ? LUMBAR LAMINECTOMY/DECOMPRESSION MICRODISCECTOMY Left 02/04/2016  ? Procedure: REDO MICRO LUMBAR DECOMPRESSION  L4-5, L5-S1 ON THE LEFT  2 LEVELS;  Surgeon: Susa Day, MD;  Location: [facility];  Service: Orthopedics;  Laterality: Left;  ? SHOULDER SURGERY Right 2014  ? SPINE SURGERY  02/07/2005  ? [facility]  ? ?No Known Allergies ?Prior to Admission medications   ?Medication Sig Start Date End Date Taking? Authorizing Provider  ?atorvastatin (LIPITOR) 20 MG tablet Take 1 tablet (20 mg total) by mouth daily. 12/04/20  Yes Wendie Agreste, MD  ?losartan-hydrochlorothiazide [facility]) 100-12.5 MG tablet TAKE 1 TABLET BY MOUTH DAILY 04/06/21  Yes Wendie Agreste, MD  ?cyclobenzaprine (FLEXERIL) 5 MG tablet Take 1 tablet (5 mg total) by mouth 3 (three) times daily as needed for muscle spasms (start qhs prn due to sedation). 10/01/20   Wendie Agreste, MD  ?meloxicam (MOBIC) 7.5 MG tablet Take 1 tablet (7.5 mg total) by mouth daily as needed for pain. 10/01/20   Wendie Agreste, MD  ? ?Social History  ? ?Socioeconomic History  ? Marital status: Married  ?  Spouse name: Not on file  ? Number of children: 2  ? Years of education: Not on file  ? Highest education level: Not on file  ?Occupational History  ? Occupation: unemployed  ?  Comment: since 2015  ?Tobacco Use  ? Smoking status: Never  ? Smokeless tobacco: Never  ?Substance and Sexual Activity  ? Alcohol use: No  ? Drug use: No  ? Sexual activity: Yes  ?Other Topics Concern  ? Not on file  ?Social History Narrative  ? Marital status: married  ?    Children: 2 daughters  ?    Lives: with wife, 2 daughters  ?    Employment: unemployed after termination in 2015  ?   Tobacco: none  ?    Alcohol: none  ?    Exercise:  Walking 4-5 miles daily  ? ?Social Determinants of Health  ? ?Financial Resource Strain: Not on file  ?Food Insecurity: Not on file  ?Transportation Needs: Not on file  ?Physical Activity: Not on file  ?Stress: Not on file  ?Social Connections: Not on file  ?Intimate Partner Violence: Not on file  ? ? ?Review of Systems ?13 point review of systems per patient  health survey noted.  Negative other than as indicated above or in HPI.  ? ? ?Objective:  ? ?Vitals:  ? 06/10/21 1518  ?BP: 122/76  ?Pulse: 85  ?Resp: 15  ?Temp: 98.2 ?F (36.8 ?C)  ?TempSrc: Temporal  ?SpO2: 97%  ?Weight: 294 lb 12.8 oz (133.7 kg)  ?Height: 6\' 3"  (1.905 m)  ? ? ? ?Physical Exam ?Vitals reviewed.  ?Constitutional:   ?   General: He  is not in acute distress. ?   Appearance: He is well-developed. He is obese. He is not toxic-appearing.  ?HENT:  ?   Head: Normocephalic and atraumatic.  ?   Right Ear: Tympanic membrane, ear canal and external ear normal. There is no impacted cerumen.  ?   Left Ear: External ear normal. There is impacted cerumen.  ?Eyes:  ?   Conjunctiva/sclera: Conjunctivae normal.  ?   Pupils: Pupils are equal, round, and reactive to light.  ?Neck:  ?   Thyroid: No thyromegaly.  ?Cardiovascular:  ?   Rate and Rhythm: Normal rate and regular rhythm.  ?   Heart sounds: Normal heart sounds.  ?Pulmonary:  ?   Effort: Pulmonary effort is normal. No respiratory distress.  ?   Breath sounds: Normal breath sounds. No wheezing.  ?Abdominal:  ?   General: There is no distension.  ?   Palpations: Abdomen is soft.  ?   Tenderness: There is no abdominal tenderness.  ?Musculoskeletal:     ?   General: No tenderness. Normal range of motion.  ?   Cervical back: Normal range of motion and neck supple.  ?Lymphadenopathy:  ?   Cervical: No cervical adenopathy.  ?Skin: ?   General: Skin is warm and dry.  ?Neurological:  ?   Mental Status: He is alert and oriented to person, place, and time.  ?   Deep Tendon Reflexes: Reflexes are normal and symmetric.  ?Psychiatric:     ?   Behavior: Behavior normal.  ? ?Verbal consent obtained for left ear lavage for impacted cerumen. Flushed by CMA.  Successful lavage without complications.  Canal clear, TM intact.  Minimal erythema of canal after flushing but no exudate or lesions.  Tolerated procedure well without complications.   He did note that with previous  showering would notice some blockage of ear canal until water cleared.  RTC precautions given. ? ?Assessment & Plan:  ?Parker Saunders is a 56 y.o. male . ?Annual physical exam - Plan: Comprehensive metabolic panel, Lip

## 2021-06-10 NOTE — Patient Instructions (Signed)
Thanks for coming in today.  See information below on keeping healthy as well as earwax.  Let me know if there are questions. ? ?Ear Irrigation ?Ear irrigation is a procedure to wash dirt and wax out of your ear canal. This procedure is also called lavage. You may need ear irrigation if you are having trouble hearing because of a buildup of earwax. You may also have ear irrigation as part of the treatment for an ear infection. Getting wax and dirt out of your ear canal can help ear drops work better. ?Tell a health care provider about: ?Any allergies you have. ?All medicines you are taking, including vitamins, herbs, eye drops, creams, and over-the-counter medicines. ?Any problems you or family members have had with anesthetic medicines. ?Any blood disorders you have. ?Any surgeries you have had. This includes any ear surgeries. ?Any medical conditions you have. ?Whether you are pregnant or may be pregnant. ?What are the risks? ?Generally, this is a safe procedure. However, problems may occur, including: ?Infection. ?Pain. ?Hearing loss. ?Fluid and debris being pushed through the eardrum and into the middle ear. This can occur if there are holes in the eardrum. ?Ear irrigation failing to work. ?What happens before the procedure? ?You will talk with your provider about the procedure and plan. ?You may be given ear drops to put in your ear 15-20 minutes before irrigation. This helps loosen the wax. ?What happens during the procedure? ? ?A syringe is filled with water or saline solution, which is made of salt and water. ?The syringe is gently inserted into the ear canal. ?The fluid is used to flush out wax and other debris. ?The procedure may vary among health care providers and hospitals. ?What can I expect after the procedure? ?After an ear irrigation, follow instructions given to you by your health care provider. ?Follow these instructions at home: ?Using ear irrigation kits ?Ear irrigation kits are available for  use at home. Ask your health care provider if this is an option for you. In general, you should: ?Use a home irrigation kit only as told by your health care provider. ?Read the package instructions carefully. ?Follow the directions for using the syringe. ?Use water that is room temperature. ?Do not do ear irrigation at home if you: ?Have diabetes. Diabetes increases the risk of infection. ?Have a hole or tear in your eardrum. ?Have tubes in your ears. ?Have had any ear surgery in the past. ?Have been told not to irrigate your ears. ?Cleaning your ears ? ?Clean the outside of your ear with a soft washcloth daily. ?If told by your health care provider, use a few drops of baby oil, mineral oil, glycerin, hydrogen peroxide, or over-the-counter earwax softening drops. ?Do not use cotton swabs to clean your ears. These can push wax down into the ear canal. ?Do not put anything into your ears to try to remove wax. This includes ear candles. ?General instructions ?Take over-the-counter and prescription medicines only as told by your health care provider. ?If you were prescribed an antibiotic medicine, use it as told by your health care provider. Do not stop using the antibiotic even if your condition improves. ?Keep the ear clean and dry by following the instructions from your health care provider. ?Keep all follow-up visits. This is important. ?Visit your health care provider at least once a year to have your ears and hearing checked. ?Contact a health care provider if: ?Your hearing is not improving or is getting worse. ?You have pain or redness  in your ear. ?You are dizzy. ?You have ringing in your ears. ?You have nausea or vomiting. ?You have fluid, blood, or pus coming out of your ear. ?Summary ?Ear irrigation is a procedure to wash dirt and wax out of your ear canal. This procedure is also called lavage. ?To perform ear irrigation, ear drops may be put in your ear 15-20 minutes before irrigation. Water or saline  solution will be used to flush out earwax and other debris. ?You may be able to irrigate your ears at home. Ask your health care provider if this is an option for you. Follow your health care provider's instructions. ?Clean your ears with a soft cloth after irrigation. Do not use cotton swabs to clean your ears. These can push wax down into the ear canal. ?This information is not intended to replace advice given to you by your health care provider. Make sure you discuss any questions you have with your health care provider. ?Document Revised: 05/14/2019 Document Reviewed: 05/14/2019 ?Elsevier Patient Education ? [place]. ? ? ?Preventive Care 65-38 Years Old, Male ?Preventive care refers to lifestyle choices and visits with your health care provider that can promote health and wellness. Preventive care visits are also called wellness exams. ?What can I expect for my preventive care visit? ?Counseling ?During your preventive care visit, your health care provider may ask about your: ?Medical history, including: ?Past medical problems. ?Family medical history. ?Current health, including: ?Emotional well-being. ?Home life and relationship well-being. ?Sexual activity. ?Lifestyle, including: ?Alcohol, nicotine or tobacco, and drug use. ?Access to firearms. ?Diet, exercise, and sleep habits. ?Safety issues such as seatbelt and bike helmet use. ?Sunscreen use. ?Work and work Statistician. ?Physical exam ?Your health care provider will check your: ?Height and weight. These may be used to calculate your BMI (body mass index). BMI is a measurement that tells if you are at a healthy weight. ?Waist circumference. This measures the distance around your waistline. This measurement also tells if you are at a healthy weight and may help predict your risk of certain diseases, such as type 2 diabetes and high blood pressure. ?Heart rate and blood pressure. ?Body temperature. ?Skin for abnormal spots. ?What immunizations do I  need? ? ?Vaccines are usually given at various ages, according to a schedule. Your health care provider will recommend vaccines for you based on your age, medical history, and lifestyle or other factors, such as travel or where you work. ?What tests do I need? ?Screening ?Your health care provider may recommend screening tests for certain conditions. This may include: ?Lipid and cholesterol levels. ?Diabetes screening. This is done by checking your blood sugar (glucose) after you have not eaten for a while (fasting). ?Hepatitis B test. ?Hepatitis C test. ?HIV (human immunodeficiency virus) test. ?STI (sexually transmitted infection) testing, if you are at risk. ?Lung cancer screening. ?Prostate cancer screening. ?Colorectal cancer screening. ?Talk with your health care provider about your test results, treatment options, and if necessary, the need for more tests. ?Follow these instructions at home: ?Eating and drinking ? ?Eat a diet that includes fresh fruits and vegetables, whole grains, lean protein, and low-fat dairy products. ?Take vitamin and mineral supplements as recommended by your health care provider. ?Do not drink alcohol if your health care provider tells you not to drink. ?If you drink alcohol: ?Limit how much you have to 0-2 drinks a day. ?Know how much alcohol is in your drink. In the [place] one drink equals one 12 oz bottle of beer (355  mL), one 5 oz glass of wine (148 mL), or one 1? oz glass of hard liquor (44 mL). ?Lifestyle ?Brush your teeth every morning and night with fluoride toothpaste. Floss one time each day. ?Exercise for at least 30 minutes 5 or more days each week. ?Do not use any products that contain nicotine or tobacco. These products include cigarettes, chewing tobacco, and vaping devices, such as e-cigarettes. If you need help quitting, ask your health care provider. ?Do not use drugs. ?If you are sexually active, practice safe sex. Use a condom or other form of protection to prevent  STIs. ?Take aspirin only as told by your health care provider. Make sure that you understand how much to take and what form to take. Work with your health care provider to find out whether it is safe and ben

## 2021-06-11 LAB — COMPREHENSIVE METABOLIC PANEL
ALT: 23 U/L (ref 0–53)
AST: 17 U/L (ref 0–37)
Albumin: 4.4 g/dL (ref 3.5–5.2)
Alkaline Phosphatase: 73 U/L (ref 39–117)
BUN: 22 mg/dL (ref 6–23)
CO2: 27 mEq/L (ref 19–32)
Calcium: 9.8 mg/dL (ref 8.4–10.5)
Chloride: 102 mEq/L (ref 96–112)
Creatinine, Ser: 1.19 mg/dL (ref 0.40–1.50)
GFR: 68.08 mL/min (ref >60.00)
Glucose, Bld: 82 mg/dL (ref 70–99)
Potassium: 4.1 mEq/L (ref 3.5–5.1)
Sodium: 139 mEq/L (ref 135–145)
Total Bilirubin: 0.6 mg/dL (ref 0.2–1.2)
Total Protein: 7 g/dL (ref 6.0–8.3)

## 2021-06-11 LAB — LIPID PANEL
Cholesterol: 146 mg/dL (ref 0–200)
HDL: 32.6 mg/dL — ABNORMAL LOW (ref >39.00)
LDL Cholesterol: 79 mg/dL (ref 0–99)
NonHDL: 113.45
Total CHOL/HDL Ratio: 4
Triglycerides: 170 mg/dL — ABNORMAL HIGH (ref 0.0–149.0)
VLDL: 34 mg/dL (ref 0.0–40.0)

## 2021-06-11 LAB — HEMOGLOBIN A1C: Hgb A1c MFr Bld: 5.4 % (ref 4.6–6.5)

## 2021-07-13 ENCOUNTER — Telehealth: Payer: Self-pay | Admitting: Family Medicine

## 2021-07-13 NOTE — Telephone Encounter (Signed)
Left message for patient to call back and schedule Medicare Annual Wellness Visit (AWV).   Please offer to do virtually or by telephone.  Left office number and my jabber [phone].  Last AWV:06/03/2019  Please schedule at anytime with Nurse Health Advisor.

## 2021-07-15 ENCOUNTER — Ambulatory Visit (INDEPENDENT_AMBULATORY_CARE_PROVIDER_SITE_OTHER): Payer: Medicare HMO

## 2021-07-15 DIAGNOSIS — Z0000 Encounter for general adult medical examination without abnormal findings: Secondary | ICD-10-CM | POA: Diagnosis not present

## 2021-07-15 NOTE — Progress Notes (Signed)
Subjective:   Parker Saunders is a 56 y.o. male who presents for an Initial Medicare Annual Wellness Visit.   I connected with Nora Rooke  today by telephone and verified that I am speaking with the correct person using two identifiers. Location patient: home Location provider: work Persons participating in the virtual visit: patient, provider.   I discussed the limitations, risks, security and privacy concerns of performing an evaluation and management service by telephone and the availability of in person appointments. I also discussed with the patient that there may be a patient responsible charge related to this service. The patient expressed understanding and verbally consented to this telephonic visit.    Interactive audio and video telecommunications were attempted between this provider and patient, however failed, due to patient having technical difficulties OR patient did not have access to video capability.  We continued and completed visit with audio only.    Review of Systems     Cardiac Risk Factors include: advanced age (>76men, >45 women);male gender     Objective:    Today's Vitals   There is no height or weight on file to calculate BMI.     07/15/2021    4:10 PM 06/04/2020   10:16 AM 06/03/2019   11:03 AM 02/04/2016    3:07 PM 01/25/2016    9:10 AM 08/27/2012    3:43 PM  Advanced Directives  Does Patient Have a Medical Advance Directive? No No No No No Patient does not have advance directive;Patient would like information  Would patient like information on creating a medical advance directive? No - Patient declined No - Patient declined Yes (ED - Information included in AVS) No - Patient declined No - Patient declined     Current Medications (verified) Outpatient Encounter Medications as of 07/15/2021  Medication Sig   atorvastatin (LIPITOR) 20 MG tablet Take 1 tablet (20 mg total) by mouth daily.   losartan-hydrochlorothiazide (HYZAAR) 100-12.5 MG tablet  Take 1 tablet by mouth daily.   No facility-administered encounter medications on file as of 07/15/2021.    Allergies (verified) Patient has no known allergies.   History: Past Medical History:  Diagnosis Date   Hypertension    Renal stone    Rotator cuff disorder    Past Surgical History:  Procedure Laterality Date   BACK SURGERY  2005   LUMBAR LAMINECTOMY/DECOMPRESSION MICRODISCECTOMY Left 02/04/2016   Procedure: REDO MICRO LUMBAR DECOMPRESSION L4-5, L5-S1 ON THE LEFT  2 LEVELS;  Surgeon: [name], MD;  Location: [facility];  Service: Orthopedics;  Laterality: Left;   SHOULDER SURGERY Right 2014   SPINE SURGERY  02/07/2005   [facility]   Family History  Problem Relation Age of Onset   Hyperlipidemia Mother    Hypertension Mother    COPD Father    Hyperlipidemia Father    Hypertension Father    Social History   Socioeconomic History   Marital status: Married    Spouse name: Not on file   Number of children: 2   Years of education: Not on file   Highest education level: Not on file  Occupational History   Occupation: unemployed    Comment: since 2015  Tobacco Use   Smoking status: Never   Smokeless tobacco: Never  Substance and Sexual Activity   Alcohol use: No   Drug use: No   Sexual activity: Yes  Other Topics Concern   Not on file  Social History Narrative   Marital status: married  Children: 2 daughters      Lives: with wife, 2 daughters      Employment: unemployed after termination in 2015     Tobacco: none      Alcohol: none      Exercise:  Walking 4-5 miles daily   Social Determinants of Health   Financial Resource Strain: Low Risk  (07/15/2021)   Overall Financial Resource Strain (CARDIA)    Difficulty of Paying Living Expenses: Not hard at all  Food Insecurity: No Food Insecurity (07/15/2021)   Hunger Vital Sign    Worried About Running Out of Food in the Last Year: Never true    Ran Out of Food in the Last Year: Never true   Transportation Needs: No Transportation Needs (07/15/2021)   PRAPARE - Administrator, Civil Service (Medical): No    Lack of Transportation (Non-Medical): No  Physical Activity: Insufficiently Active (07/15/2021)   Exercise Vital Sign    Days of Exercise per Week: 3 days    Minutes of Exercise per Session: 30 min  Stress: No Stress Concern Present (07/15/2021)   [employer] of Occupational Health - Occupational Stress Questionnaire    Feeling of Stress : Not at all  Social Connections: Moderately Integrated (07/15/2021)   Social Connection and Isolation Panel [NHANES]    Frequency of Communication with Friends and Family: Three times a week    Frequency of Social Gatherings with Friends and Family: Three times a week    Attends Religious Services: More than 4 times per year    Active Member of Clubs or Organizations: No    Attends Banker Meetings: Never    Marital Status: Married    Tobacco Counseling Counseling given: Not Answered   Clinical Intake:  Pre-visit preparation completed: Yes  Pain : No/denies pain     Nutritional Risks: None Diabetes: No  How often do you need to have someone help you when you read instructions, pamphlets, or other written materials from your doctor or pharmacy?: 1 - Never What is the last grade level you completed in school?: High School  Diabetic?no   Interpreter Needed?: No  Information entered by :: L.Jaaziel Peatross,LPN   Activities of Daily Living    07/15/2021    4:14 PM  In your present state of health, do you have any difficulty performing the following activities:  Hearing? 0  Vision? 0  Difficulty concentrating or making decisions? 0  Walking or climbing stairs? 0  Dressing or bathing? 0  Doing errands, shopping? 0  Preparing Food and eating ? N  Using the Toilet? N  In the past six months, have you accidently leaked urine? N  Do you have problems with loss of bowel control? N  Managing your  Medications? N  Managing your Finances? N  Housekeeping or managing your Housekeeping? N    Patient Care Team: Shade Flood, MD as PCP - General (Family Medicine)  Indicate any recent Medical Services you may have received from other than [facility] providers in the past year (date may be approximate).     Assessment:   This is a routine wellness examination for Epifanio.  Hearing/Vision screen Vision Screening - Comments:: Annual eye exams wears glasses   Dietary issues and exercise activities discussed: Current Exercise Habits: Home exercise routine, Type of exercise: walking, Time (Minutes): 30, Frequency (Times/Week): 3, Weekly Exercise (Minutes/Week): 90, Intensity: Mild, Exercise limited by: orthopedic condition(s)   Goals Addressed  This Visit's Progress     Increase physical activity (pt-stated)   On track      Depression Screen    07/15/2021    4:11 PM 07/15/2021    4:09 PM 06/10/2021    3:17 PM 12/04/2020    9:48 AM 06/04/2020   10:16 AM 12/16/2019    3:04 PM 09/25/2019    9:13 AM  PHQ 2/9 Scores  PHQ - 2 Score 0 0 0 0 0 0 0  PHQ- 9 Score   0        Fall Risk    07/15/2021    4:11 PM 06/10/2021    3:17 PM 12/04/2020    9:47 AM 06/04/2020   10:15 AM 12/16/2019    3:04 PM  Fall Risk   Falls in the past year? 0 0 0 0 0  Number falls in past yr: 0 0 0  0  Injury with Fall? 0 0 0  0  Risk for fall due to :  No Fall Risks No Fall Risks    Risk for fall due to: Comment uses cane      Follow up Falls evaluation completed Falls evaluation completed Falls evaluation completed Falls evaluation completed Falls evaluation completed    FALL RISK PREVENTION PERTAINING TO THE HOME:  Any stairs in or around the home? Yes  If so, are there any without handrails? No  Home free of loose throw rugs in walkways, pet beds, electrical cords, etc? Yes  Adequate lighting in your home to reduce risk of falls? Yes   ASSISTIVE DEVICES UTILIZED TO PREVENT FALLS:  Life  alert? No  Use of a cane, walker or w/c? No  Grab bars in the bathroom? Yes  Shower chair or bench in shower? Yes  Elevated toilet seat or a handicapped toilet? Yes     Cognitive Function:    Normal cognitive status assessed by telephone conversation by this Nurse Health Advisor. No abnormalities found.      06/03/2019   11:04 AM  6CIT Screen  What Year? 0 points  What month? 0 points  What time? 0 points  Count back from 20 0 points  Months in reverse 0 points  Repeat phrase 0 points  Total Score 0 points    Immunizations Immunization History  Administered Date(s) Administered   Influenza,inj,Quad PF,6+ Mos 12/04/2020   Tdap 01/16/2018    TDAP status: Up to date  Flu Vaccine status: Up to date  Pneumococcal vaccine status: Due, Education has been provided regarding the importance of this vaccine. Advised may receive this vaccine at local pharmacy or Health Dept. Aware to provide a copy of the vaccination record if obtained from local pharmacy or Health Dept. Verbalized acceptance and understanding.  Covid-19 vaccine status: Completed vaccines  Qualifies for Shingles Vaccine? Yes   Zostavax completed No   Shingrix Completed?: No.    Education has been provided regarding the importance of this vaccine. Patient has been advised to call insurance company to determine out of pocket expense if they have not yet received this vaccine. Advised may also receive vaccine at local pharmacy or Health Dept. Verbalized acceptance and understanding.  Screening Tests Health Maintenance  Topic Date Due   COVID-19 Vaccine (1) Never done   Zoster Vaccines- Shingrix (1 of 2) 09/10/2021 (Originally 07/03/1984)   INFLUENZA VACCINE  09/07/2021   Fecal DNA (Cologuard)  04/21/2024   TETANUS/TDAP  01/17/2028   Hepatitis C Screening  Completed   HIV Screening  Completed  HPV VACCINES  Aged Out    Health Maintenance  Health Maintenance Due  Topic Date Due   COVID-19 Vaccine (1) Never  done    Colorectal cancer screening: Type of screening: Colonoscopy. Completed 04/21/2021. Repeat every 3 years  Lung Cancer Screening: (Low Dose CT Chest recommended if Age 44-80 years, 30 pack-year currently smoking OR have quit w/in 15years.) does not qualify.   Lung Cancer Screening Referral: n/a  Additional Screening:  Hepatitis C Screening: does not qualify;  Vision Screening: Recommended annual ophthalmology exams for early detection of glaucoma and other disorders of the eye. Is the patient up to date with their annual eye exam?  Yes  Who is the provider or what is the name of the office in which the patient attends annual eye exams? [facility]  If pt is not established with a provider, would they like to be referred to a provider to establish care? No .   Dental Screening: Recommended annual dental exams for proper oral hygiene  Community Resource Referral / Chronic Care Management: CRR required this visit?  No   CCM required this visit?  No      Plan:     I have personally reviewed and noted the following in the patient's chart:   Medical and social history Use of alcohol, tobacco or illicit drugs  Current medications and supplements including opioid prescriptions. Patient is not currently taking opioid prescriptions. Functional ability and status Nutritional status Physical activity Advanced directives List of other physicians Hospitalizations, surgeries, and ER visits in previous 12 months Vitals Screenings to include cognitive, depression, and falls Referrals and appointments  In addition, I have reviewed and discussed with patient certain preventive protocols, quality metrics, and best practice recommendations. A written personalized care plan for preventive services as well as general preventive health recommendations were provided to patient.     March RummageLaura Rickia Freeburg, LPN   1/6/10966/09/2021   Nurse Notes: none

## 2021-07-15 NOTE — Patient Instructions (Signed)
[name] , Thank you for taking time to come for your Medicare Wellness Visit. I appreciate your ongoing commitment to your health goals. Please review the following plan we discussed and let me know if I can assist you in the future.   Screening recommendations/referrals: Colonoscopy: 04/21/2021 Recommended yearly ophthalmology/optometry visit for glaucoma screening and checkup Recommended yearly dental visit for hygiene and checkup  Vaccinations: Influenza vaccine: completed  Pneumococcal vaccine: completed  Tdap vaccine: 11/16/2017 Shingles vaccine: will consider     Advanced directives: none   Conditions/risks identified: none   Next appointment: none   Preventive Care 65 Years and Older, Male Preventive care refers to lifestyle choices and visits with your health care provider that can promote health and wellness. What does preventive care include? A yearly physical exam. This is also called an annual well check. Dental exams once or twice a year. Routine eye exams. Ask your health care provider how often you should have your eyes checked. Personal lifestyle choices, including: Daily care of your teeth and gums. Regular physical activity. Eating a healthy diet. Avoiding tobacco and drug use. Limiting alcohol use. Practicing safe sex. Taking low doses of aspirin every day. Taking vitamin and mineral supplements as recommended by your health care provider. What happens during an annual well check? The services and screenings done by your health care provider during your annual well check will depend on your age, overall health, lifestyle risk factors, and family history of disease. Counseling  Your health care provider may ask you questions about your: Alcohol use. Tobacco use. Drug use. Emotional well-being. Home and relationship well-being. Sexual activity. Eating habits. History of falls. Memory and ability to understand (cognition). Work and work  Astronomer. Screening  You may have the following tests or measurements: Height, weight, and BMI. Blood pressure. Lipid and cholesterol levels. These may be checked every 5 years, or more frequently if you are over 101 years old. Skin check. Lung cancer screening. You may have this screening every year starting at age 44 if you have a 30-pack-year history of smoking and currently smoke or have quit within the past 15 years. Fecal occult blood test (FOBT) of the stool. You may have this test every year starting at age 53. Flexible sigmoidoscopy or colonoscopy. You may have a sigmoidoscopy every 5 years or a colonoscopy every 10 years starting at age 57. Prostate cancer screening. Recommendations will vary depending on your family history and other risks. Hepatitis C blood test. Hepatitis B blood test. Sexually transmitted disease (STD) testing. Diabetes screening. This is done by checking your blood sugar (glucose) after you have not eaten for a while (fasting). You may have this done every 1-3 years. Abdominal aortic aneurysm (AAA) screening. You may need this if you are a current or former smoker. Osteoporosis. You may be screened starting at age 41 if you are at high risk. Talk with your health care provider about your test results, treatment options, and if necessary, the need for more tests. Vaccines  Your health care provider may recommend certain vaccines, such as: Influenza vaccine. This is recommended every year. Tetanus, diphtheria, and acellular pertussis (Tdap, Td) vaccine. You may need a Td booster every 10 years. Zoster vaccine. You may need this after age 26. Pneumococcal 13-valent conjugate (PCV13) vaccine. One dose is recommended after age 80. Pneumococcal polysaccharide (PPSV23) vaccine. One dose is recommended after age 57. Talk to your health care provider about which screenings and vaccines you need and how often you  need them. This information is not intended to replace  advice given to you by your health care provider. Make sure you discuss any questions you have with your health care provider. Document Released: 02/20/2015 Document Revised: 10/14/2015 Document Reviewed: 11/25/2014 Elsevier Interactive Patient Education  2017 [place] [employer] Prevention in the Home Falls can cause injuries. They can happen to people of all ages. There are many things you can do to make your home safe and to help prevent falls. What can I do on the outside of my home? Regularly fix the edges of walkways and driveways and fix any cracks. Remove anything that might make you trip as you walk through a door, such as a raised step or threshold. Trim any bushes or trees on the path to your home. Use bright outdoor lighting. Clear any walking paths of anything that might make someone trip, such as rocks or tools. Regularly check to see if handrails are loose or broken. Make sure that both sides of any steps have handrails. Any raised decks and porches should have guardrails on the edges. Have any leaves, snow, or ice cleared regularly. Use sand or salt on walking paths during winter. Clean up any spills in your garage right away. This includes oil or grease spills. What can I do in the bathroom? Use night lights. Install grab bars by the toilet and in the tub and shower. Do not use towel bars as grab bars. Use non-skid mats or decals in the tub or shower. If you need to sit down in the shower, use a plastic, non-slip stool. Keep the floor dry. Clean up any water that spills on the floor as soon as it happens. Remove soap buildup in the tub or shower regularly. Attach bath mats securely with double-sided non-slip rug tape. Do not have throw rugs and other things on the floor that can make you trip. What can I do in the bedroom? Use night lights. Make sure that you have a light by your bed that is easy to reach. Do not use any sheets or blankets that are too big for your bed.  They should not hang down onto the floor. Have a firm chair that has side arms. You can use this for support while you get dressed. Do not have throw rugs and other things on the floor that can make you trip. What can I do in the kitchen? Clean up any spills right away. Avoid walking on wet floors. Keep items that you use a lot in easy-to-reach places. If you need to reach something above you, use a strong step stool that has a grab bar. Keep electrical cords out of the way. Do not use floor polish or wax that makes floors slippery. If you must use wax, use non-skid floor wax. Do not have throw rugs and other things on the floor that can make you trip. What can I do with my stairs? Do not leave any items on the stairs. Make sure that there are handrails on both sides of the stairs and use them. Fix handrails that are broken or loose. Make sure that handrails are as long as the stairways. Check any carpeting to make sure that it is firmly attached to the stairs. Fix any carpet that is loose or worn. Avoid having throw rugs at the top or bottom of the stairs. If you do have throw rugs, attach them to the floor with carpet tape. Make sure that you have a light switch  at the top of the stairs and the bottom of the stairs. If you do not have them, ask someone to add them for you. What else can I do to help prevent falls? Wear shoes that: Do not have high heels. Have rubber bottoms. Are comfortable and fit you well. Are closed at the toe. Do not wear sandals. If you use a stepladder: Make sure that it is fully opened. Do not climb a closed stepladder. Make sure that both sides of the stepladder are locked into place. Ask someone to hold it for you, if possible. Clearly mark and make sure that you can see: Any grab bars or handrails. First and last steps. Where the edge of each step is. Use tools that help you move around (mobility aids) if they are needed. These  include: Canes. Walkers. Scooters. Crutches. Turn on the lights when you go into a dark area. Replace any light bulbs as soon as they burn out. Set up your furniture so you have a clear path. Avoid moving your furniture around. If any of your floors are uneven, fix them. If there are any pets around you, be aware of where they are. Review your medicines with your doctor. Some medicines can make you feel dizzy. This can increase your chance of falling. Ask your doctor what other things that you can do to help prevent falls. This information is not intended to replace advice given to you by your health care provider. Make sure you discuss any questions you have with your health care provider. Document Released: 11/20/2008 Document Revised: 07/02/2015 Document Reviewed: 02/28/2014 Elsevier Interactive Patient Education  2017 [employer].

## 2021-12-13 ENCOUNTER — Encounter: Payer: Self-pay | Admitting: Family Medicine

## 2021-12-13 ENCOUNTER — Ambulatory Visit (INDEPENDENT_AMBULATORY_CARE_PROVIDER_SITE_OTHER): Payer: Medicare HMO | Admitting: Family Medicine

## 2021-12-13 VITALS — BP 118/84 | HR 80 | Temp 98.1°F | Ht 75.0 in | Wt 292.4 lb

## 2021-12-13 DIAGNOSIS — I4891 Unspecified atrial fibrillation: Secondary | ICD-10-CM

## 2021-12-13 DIAGNOSIS — E785 Hyperlipidemia, unspecified: Secondary | ICD-10-CM | POA: Diagnosis not present

## 2021-12-13 DIAGNOSIS — R0609 Other forms of dyspnea: Secondary | ICD-10-CM | POA: Diagnosis not present

## 2021-12-13 DIAGNOSIS — I10 Essential (primary) hypertension: Secondary | ICD-10-CM | POA: Diagnosis not present

## 2021-12-13 MED ORDER — LOSARTAN POTASSIUM-HCTZ 100-12.5 MG PO TABS: 1.0000 | ORAL_TABLET | Freq: Every day | ORAL | 2 refills | Status: DC

## 2021-12-13 MED ORDER — ATORVASTATIN CALCIUM 20 MG PO TABS: 20.0000 mg | ORAL_TABLET | Freq: Every day | ORAL | 2 refills | Status: DC

## 2021-12-13 NOTE — Progress Notes (Signed)
Subjective:  Patient ID: Parker Saunders, male    DOB: 1965-10-03  Age: 56 y.o. MRN: [id number]  CC:  Chief Complaint  Patient presents with   Hyperlipidemia    Pt states all is well   Hypertension    HPI Parker Saunders presents for   Hypertension: Losartan hydrochlorothiazide 100/12.5 mg., no new side effects.  Home readings: 120-122/70 Cut back on portions, and more walking.  No palpitations typically - notices only faster with more physical activity, some shortness of breath with more physical activity past month. No chest pain.  Lab Results  Component Value Date   HGBA1C 5.4 06/10/2021   BP Readings from Last 3 Encounters:  12/13/21 118/84  06/10/21 122/76  12/04/20 126/78   Lab Results  Component Value Date   CREATININE 1.19 06/10/2021   Wt Readings from Last 3 Encounters:  12/13/21 292 lb 6.4 oz (132.6 kg)  06/10/21 294 lb 12.8 oz (133.7 kg)  12/04/20 285 lb 3.2 oz (129.4 kg)     Hyperlipidemia: Lipitor 20mg  qd. No new myalgias, side effects.  Last ate 4.5 hrs ago.  Lab Results  Component Value Date   CHOL 146 06/10/2021   HDL 32.60 (L) 06/10/2021   LDLCALC 79 06/10/2021   TRIG 170.0 (H) 06/10/2021   CHOLHDL 4 06/10/2021   Lab Results  Component Value Date   ALT 23 06/10/2021   AST 17 06/10/2021   ALKPHOS 73 06/10/2021   BILITOT 0.6 06/10/2021   HM: Covid booster - declines.    History Patient Active Problem List   Diagnosis Date Noted   Spinal stenosis of lumbar region 02/04/2016   Unspecified essential hypertension 01/25/2013   Past Medical History:  Diagnosis Date   Hypertension    Renal stone    Rotator cuff disorder    Past Surgical History:  Procedure Laterality Date   BACK SURGERY  2005   LUMBAR LAMINECTOMY/DECOMPRESSION MICRODISCECTOMY Left 02/04/2016   Procedure: REDO MICRO LUMBAR DECOMPRESSION L4-5, L5-S1 ON THE LEFT  2 LEVELS;  Surgeon: 02/06/2016, MD;  Location: [facility];  Service: Orthopedics;  Laterality: Left;    SHOULDER SURGERY Right 2014   SPINE SURGERY  02/07/2005   [facility]   No Known Allergies Prior to Admission medications   Medication Sig Start Date End Date Taking? Authorizing Provider  atorvastatin (LIPITOR) 20 MG tablet Take 1 tablet (20 mg total) by mouth daily. 06/10/21  Yes 08/10/21, MD  losartan-hydrochlorothiazide (HYZAAR) 100-12.5 MG tablet Take 1 tablet by mouth daily. 06/10/21  Yes 08/10/21, MD   Social History   Socioeconomic History   Marital status: Married    Spouse name: Not on file   Number of children: 2   Years of education: Not on file   Highest education level: Not on file  Occupational History   Occupation: unemployed    Comment: since 2015  Tobacco Use   Smoking status: Never   Smokeless tobacco: Never  Substance and Sexual Activity   Alcohol use: No   Drug use: No   Sexual activity: Yes  Other Topics Concern   Not on file  Social History Narrative   Marital status: married      Children: 2 daughters      Lives: with wife, 2 daughters      Employment: unemployed after termination in 2015     Tobacco: none      Alcohol: none      Exercise:  Walking 4-5 miles daily  Social Determinants of Health   Financial Resource Strain: Low Risk  (07/15/2021)   Overall Financial Resource Strain (CARDIA)    Difficulty of Paying Living Expenses: Not hard at all  Food Insecurity: No Food Insecurity (07/15/2021)   Hunger Vital Sign    Worried About Running Out of Food in the Last Year: Never true    Ran Out of Food in the Last Year: Never true  Transportation Needs: No Transportation Needs (07/15/2021)   PRAPARE - Administrator, Civil Service (Medical): No    Lack of Transportation (Non-Medical): No  Physical Activity: Insufficiently Active (07/15/2021)   Exercise Vital Sign    Days of Exercise per Week: 3 days    Minutes of Exercise per Session: 30 min  Stress: No Stress Concern Present (07/15/2021)   [employer] of  Occupational Health - Occupational Stress Questionnaire    Feeling of Stress : Not at all  Social Connections: Moderately Integrated (07/15/2021)   Social Connection and Isolation Panel [NHANES]    Frequency of Communication with Friends and Family: Three times a week    Frequency of Social Gatherings with Friends and Family: Three times a week    Attends Religious Services: More than 4 times per year    Active Member of Clubs or Organizations: No    Attends Banker Meetings: Never    Marital Status: Married  Catering manager Violence: Not At Risk (07/15/2021)   Humiliation, Afraid, Rape, and Kick questionnaire    Fear of Current or Ex-Partner: No    Emotionally Abused: No    Physically Abused: No    Sexually Abused: No    Review of Systems  Constitutional:  Negative for fatigue and unexpected weight change.  Eyes:  Negative for visual disturbance.  Respiratory:  Positive for shortness of breath (only with exertion, past month.). Negative for cough and chest tightness.   Cardiovascular:  Negative for chest pain, palpitations (faster/harder heartbeat woth exertion only.) and leg swelling.  Gastrointestinal:  Negative for abdominal pain and blood in stool.  Neurological:  Negative for dizziness, light-headedness and headaches.    Objective:   Vitals:   12/13/21 1444  BP: 118/84  Pulse: 80  Temp: 98.1 F (36.7 C)  SpO2: 97%  Weight: 292 lb 6.4 oz (132.6 kg)  Height: 6\' 3"  (1.905 m)     Physical Exam Vitals reviewed.  Constitutional:      Appearance: He is well-developed.  HENT:     Head: Normocephalic and atraumatic.  Neck:     Vascular: No carotid bruit or JVD.  Cardiovascular:     Rate and Rhythm: Normal rate. Rhythm irregular.     Heart sounds: Normal heart sounds. No murmur heard.    Comments: Irregular rhythm.  Pulmonary:     Effort: Pulmonary effort is normal.     Breath sounds: Normal breath sounds. No rales.  Musculoskeletal:     Right lower leg:  No edema.     Left lower leg: No edema.  Skin:    General: Skin is warm and dry.  Neurological:     Mental Status: He is alert and oriented to person, place, and time.  Psychiatric:        Mood and Affect: Mood normal.    EKG, rate 83, atrial fibrillation..  New onset compared to December 2017 EKG.  No other acute findings noted.  Assessment & Plan:  Parker Saunders is a 56 y.o. male . Atrial fibrillation, unspecified type (HCC) -  Plan: EKG 12-Lead, Ambulatory referral to Cardiology, CBC, TSH, CANCELED: CBC, CANCELED: TSH  -New onset A-fib, likely past month with dyspnea on exertion as above.  Overall stable rate in office, asymptomatic.  Check TSH, CBC,CMP and proBNP.  Cardiology referral placed.  ER precautions given.  Hyperlipidemia, unspecified hyperlipidemia type - Plan: atorvastatin (LIPITOR) 20 MG tablet, Lipid panel, CANCELED: Lipid panel  -Tolerating current dose Lipitor, continue same.  Essential hypertension - Plan: losartan-hydrochlorothiazide (HYZAAR) 100-12.5 MG tablet, Comprehensive metabolic panel, CANCELED: Comprehensive metabolic panel  - Stable, tolerating current regimen. Medications refilled. Labs pending as above.   DOE (dyspnea on exertion) - Plan: CBC, Pro b natriuretic peptide, TSH, CANCELED: CBC, CANCELED: TSH, CANCELED: Pro b natriuretic peptide  -As above, only with exertional activities, no chest pain.  Does report faster heart rate at that time, possible A-fib with RVR, but could also be anginal equivalent.  Asymptomatic at present.  Cardiology referral as above, avoid exertional activities for now.  ER precautions given.  Meds ordered this encounter  Medications   atorvastatin (LIPITOR) 20 MG tablet    Sig: Take 1 tablet (20 mg total) by mouth daily.    Dispense:  90 tablet    Refill:  2   losartan-hydrochlorothiazide (HYZAAR) 100-12.5 MG tablet    Sig: Take 1 tablet by mouth daily.    Dispense:  90 tablet    Refill:  2   Patient Instructions   Please have labs drawn here tomorrow morning. Fasting if possible.   I do see atrial fibrillation on your EKG today.  I suspect that may have started in the past month with some of your symptoms with exertion.  Heart rate is not fast enough today to start any new medications but I will have you see cardiology soon.  Avoid exertional activities for now, be seen in the ER if any acute worsening of symptoms but I do not expect that to occur.  Take care.    Atrial Fibrillation  Atrial fibrillation is a type of irregular or rapid heartbeat (arrhythmia). In atrial fibrillation, the top part of the heart (atria) beats in an irregular pattern. This makes the heart unable to pump blood normally and effectively. The goal of treatment is to prevent blood clots from forming, control your heart rate, or restore your heartbeat to a normal rhythm. If this condition is not treated, it can cause serious problems, such as a weakened heart muscle (cardiomyopathy) or a stroke. What are the causes? This condition is often caused by medical conditions that damage the heart's electrical system. These include: High blood pressure (hypertension). This is the most common cause. Certain heart problems or conditions, such as heart failure, coronary artery disease, heart valve problems, or heart surgery. Diabetes. Overactive thyroid (hyperthyroidism). Obesity. Chronic kidney disease. In some cases, the cause of this condition is not known. What increases the risk? This condition is more likely to develop in: Older people. People who smoke. Athletes who do endurance exercise. People who have a family history of atrial fibrillation. Men. People who use drugs. People who drink a lot of alcohol. People who have lung conditions, such as emphysema, pneumonia, or COPD. People who have obstructive sleep apnea. What are the signs or symptoms? Symptoms of this condition include: A feeling that your heart is racing or  beating irregularly. Discomfort or pain in your chest. Shortness of breath. Sudden light-headedness or weakness. Tiring easily during exercise or activity. Fatigue. Syncope (fainting). Sweating. In some cases, there are no symptoms.  How is this diagnosed? Your health care provider may detect atrial fibrillation when taking your pulse. If detected, this condition may be diagnosed with: An electrocardiogram (ECG) to check electrical signals of the heart. An ambulatory cardiac monitor to record your heart's activity for a few days. A transthoracic echocardiogram (TTE) to create pictures of your heart. A transesophageal echocardiogram (TEE) to create even closer pictures of your heart. A stress test to check your blood supply while you exercise. Imaging tests, such as a CT scan or chest X-ray. Blood tests. How is this treated? Treatment depends on underlying conditions and how you feel when you experience atrial fibrillation. This condition may be treated with: Medicines to prevent blood clots or to treat heart rate or heart rhythm problems. Electrical cardioversion to reset the heart's rhythm. A pacemaker to correct abnormal heart rhythm. Ablation to remove the heart tissue that sends abnormal signals. Left atrial appendage closure to seal the area where blood clots can form. In some cases, underlying conditions will be treated. Follow these instructions at home: Medicines Take over-the counter and prescription medicines only as told by your health care provider. Do not take any new medicines without talking to your health care provider. If you are taking blood thinners: Talk with your health care provider before you take any medicines that contain aspirin or NSAIDs, such as ibuprofen. These medicines increase your risk for dangerous bleeding. Take your medicine exactly as told, at the same time every day. Avoid activities that could cause injury or bruising, and follow instructions  about how to prevent falls. Wear a medical alert bracelet or carry a card that lists what medicines you take. Lifestyle     Do not use any products that contain nicotine or tobacco, such as cigarettes, e-cigarettes, and chewing tobacco. If you need help quitting, ask your health care provider. Eat heart-healthy foods. Talk with a dietitian to make an eating plan that is right for you. Exercise regularly as told by your health care provider. Do not drink alcohol. Lose weight if you are overweight. Do not use drugs, including cannabis. General instructions If you have obstructive sleep apnea, manage your condition as told by your health care provider. Do not use diet pills unless your health care provider approves. Diet pills can make heart problems worse. Keep all follow-up visits as told by your health care provider. This is important. Contact a health care provider if you: Notice a change in the rate, rhythm, or strength of your heartbeat. Are taking a blood thinner and you notice more bruising. Tire more easily when you exercise or do heavy work. Have a sudden change in weight. Get help right away if you have:  Chest pain, abdominal pain, sweating, or weakness. Trouble breathing. Side effects of blood thinners, such as blood in your vomit, stool, or urine, or bleeding that cannot stop. Any symptoms of a stroke. "BE FAST" is an easy way to remember the main warning signs of a stroke: B - Balance. Signs are dizziness, sudden trouble walking, or loss of balance. E - Eyes. Signs are trouble seeing or a sudden change in vision. F - Face. Signs are sudden weakness or numbness of the face, or the face or eyelid drooping on one side. A - Arms. Signs are weakness or numbness in an arm. This happens suddenly and usually on one side of the body. S - Speech. Signs are sudden trouble speaking, slurred speech, or trouble understanding what people say. T - Time. Time to  call emergency services.  Write down what time symptoms started. Other signs of a stroke, such as: A sudden, severe headache with no known cause. Nausea or vomiting. Seizure. These symptoms may represent a serious problem that is an emergency. Do not wait to see if the symptoms will go away. Get medical help right away. Call your local emergency services (911 in the [place] Do not drive yourself to the hospital. Summary Atrial fibrillation is a type of irregular or rapid heartbeat (arrhythmia). Symptoms include a feeling that your heart is beating fast or irregularly. You may be given medicines to prevent blood clots or to treat heart rate or heart rhythm problems. Get help right away if you have signs or symptoms of a stroke. Get help right away if you cannot catch your breath or have chest pain or pressure. This information is not intended to replace advice given to you by your health care provider. Make sure you discuss any questions you have with your health care provider. Document Revised: 07/18/2018 Document Reviewed: 07/18/2018 Elsevier Patient Education  2023 Elsevier Inc.     Signed,   Meredith Staggers, MD [facility], [facility] 12/13/21 4:25 PM

## 2021-12-13 NOTE — Patient Instructions (Addendum)
Please have labs drawn here tomorrow morning. Fasting if possible.   I do see atrial fibrillation on your EKG today.  I suspect that may have started in the past month with some of your symptoms with exertion.  Heart rate is not fast enough today to start any new medications but I will have you see cardiology soon.  Avoid exertional activities for now, be seen in the ER if any acute worsening of symptoms but I do not expect that to occur.  Take care.    Atrial Fibrillation  Atrial fibrillation is a type of irregular or rapid heartbeat (arrhythmia). In atrial fibrillation, the top part of the heart (atria) beats in an irregular pattern. This makes the heart unable to pump blood normally and effectively. The goal of treatment is to prevent blood clots from forming, control your heart rate, or restore your heartbeat to a normal rhythm. If this condition is not treated, it can cause serious problems, such as a weakened heart muscle (cardiomyopathy) or a stroke. What are the causes? This condition is often caused by medical conditions that damage the heart's electrical system. These include: High blood pressure (hypertension). This is the most common cause. Certain heart problems or conditions, such as heart failure, coronary artery disease, heart valve problems, or heart surgery. Diabetes. Overactive thyroid (hyperthyroidism). Obesity. Chronic kidney disease. In some cases, the cause of this condition is not known. What increases the risk? This condition is more likely to develop in: Older people. People who smoke. Athletes who do endurance exercise. People who have a family history of atrial fibrillation. Men. People who use drugs. People who drink a lot of alcohol. People who have lung conditions, such as emphysema, pneumonia, or COPD. People who have obstructive sleep apnea. What are the signs or symptoms? Symptoms of this condition include: A feeling that your heart is racing or  beating irregularly. Discomfort or pain in your chest. Shortness of breath. Sudden light-headedness or weakness. Tiring easily during exercise or activity. Fatigue. Syncope (fainting). Sweating. In some cases, there are no symptoms. How is this diagnosed? Your health care provider may detect atrial fibrillation when taking your pulse. If detected, this condition may be diagnosed with: An electrocardiogram (ECG) to check electrical signals of the heart. An ambulatory cardiac monitor to record your heart's activity for a few days. A transthoracic echocardiogram (TTE) to create pictures of your heart. A transesophageal echocardiogram (TEE) to create even closer pictures of your heart. A stress test to check your blood supply while you exercise. Imaging tests, such as a CT scan or chest X-ray. Blood tests. How is this treated? Treatment depends on underlying conditions and how you feel when you experience atrial fibrillation. This condition may be treated with: Medicines to prevent blood clots or to treat heart rate or heart rhythm problems. Electrical cardioversion to reset the heart's rhythm. A pacemaker to correct abnormal heart rhythm. Ablation to remove the heart tissue that sends abnormal signals. Left atrial appendage closure to seal the area where blood clots can form. In some cases, underlying conditions will be treated. Follow these instructions at home: Medicines Take over-the counter and prescription medicines only as told by your health care provider. Do not take any new medicines without talking to your health care provider. If you are taking blood thinners: Talk with your health care provider before you take any medicines that contain aspirin or NSAIDs, such as ibuprofen. These medicines increase your risk for dangerous bleeding. Take your medicine exactly as  told, at the same time every day. Avoid activities that could cause injury or bruising, and follow instructions  about how to prevent falls. Wear a medical alert bracelet or carry a card that lists what medicines you take. Lifestyle     Do not use any products that contain nicotine or tobacco, such as cigarettes, e-cigarettes, and chewing tobacco. If you need help quitting, ask your health care provider. Eat heart-healthy foods. Talk with a dietitian to make an eating plan that is right for you. Exercise regularly as told by your health care provider. Do not drink alcohol. Lose weight if you are overweight. Do not use drugs, including cannabis. General instructions If you have obstructive sleep apnea, manage your condition as told by your health care provider. Do not use diet pills unless your health care provider approves. Diet pills can make heart problems worse. Keep all follow-up visits as told by your health care provider. This is important. Contact a health care provider if you: Notice a change in the rate, rhythm, or strength of your heartbeat. Are taking a blood thinner and you notice more bruising. Tire more easily when you exercise or do heavy work. Have a sudden change in weight. Get help right away if you have:  Chest pain, abdominal pain, sweating, or weakness. Trouble breathing. Side effects of blood thinners, such as blood in your vomit, stool, or urine, or bleeding that cannot stop. Any symptoms of a stroke. "BE FAST" is an easy way to remember the main warning signs of a stroke: B - Balance. Signs are dizziness, sudden trouble walking, or loss of balance. E - Eyes. Signs are trouble seeing or a sudden change in vision. F - Face. Signs are sudden weakness or numbness of the face, or the face or eyelid drooping on one side. A - Arms. Signs are weakness or numbness in an arm. This happens suddenly and usually on one side of the body. S - Speech. Signs are sudden trouble speaking, slurred speech, or trouble understanding what people say. T - Time. Time to call emergency services.  Write down what time symptoms started. Other signs of a stroke, such as: A sudden, severe headache with no known cause. Nausea or vomiting. Seizure. These symptoms may represent a serious problem that is an emergency. Do not wait to see if the symptoms will go away. Get medical help right away. Call your local emergency services (911 in the [place] Do not drive yourself to the hospital. Summary Atrial fibrillation is a type of irregular or rapid heartbeat (arrhythmia). Symptoms include a feeling that your heart is beating fast or irregularly. You may be given medicines to prevent blood clots or to treat heart rate or heart rhythm problems. Get help right away if you have signs or symptoms of a stroke. Get help right away if you cannot catch your breath or have chest pain or pressure. This information is not intended to replace advice given to you by your health care provider. Make sure you discuss any questions you have with your health care provider. Document Revised: 07/18/2018 Document Reviewed: 07/18/2018 Elsevier Patient Education  [place].

## 2021-12-14 ENCOUNTER — Other Ambulatory Visit (INDEPENDENT_AMBULATORY_CARE_PROVIDER_SITE_OTHER): Payer: Medicare HMO

## 2021-12-14 DIAGNOSIS — E785 Hyperlipidemia, unspecified: Secondary | ICD-10-CM

## 2021-12-14 DIAGNOSIS — I10 Essential (primary) hypertension: Secondary | ICD-10-CM | POA: Diagnosis not present

## 2021-12-14 DIAGNOSIS — I4891 Unspecified atrial fibrillation: Secondary | ICD-10-CM

## 2021-12-14 DIAGNOSIS — R0609 Other forms of dyspnea: Secondary | ICD-10-CM | POA: Diagnosis not present

## 2021-12-14 LAB — CBC
HCT: 42.1 % (ref 39.0–52.0)
Hemoglobin: 14 g/dL (ref 13.0–17.0)
MCHC: 33.3 g/dL (ref 30.0–36.0)
MCV: 85.4 fl (ref 78.0–100.0)
Platelets: 269 10*3/uL (ref 150.0–400.0)
RBC: 4.93 Mil/uL (ref 4.22–5.81)
RDW: 13.4 % (ref 11.5–15.5)
WBC: 8.6 10*3/uL (ref 4.0–10.5)

## 2021-12-14 LAB — COMPREHENSIVE METABOLIC PANEL
ALT: 34 U/L (ref 0–53)
AST: 30 U/L (ref 0–37)
Albumin: 4.3 g/dL (ref 3.5–5.2)
Alkaline Phosphatase: 74 U/L (ref 39–117)
BUN: 21 mg/dL (ref 6–23)
CO2: 26 mEq/L (ref 19–32)
Calcium: 9.4 mg/dL (ref 8.4–10.5)
Chloride: 106 mEq/L (ref 96–112)
Creatinine, Ser: 1.14 mg/dL (ref 0.40–1.50)
GFR: 71.42 mL/min (ref >60.00)
Glucose, Bld: 96 mg/dL (ref 70–99)
Potassium: 4.3 mEq/L (ref 3.5–5.1)
Sodium: 139 mEq/L (ref 135–145)
Total Bilirubin: 0.5 mg/dL (ref 0.2–1.2)
Total Protein: 7.2 g/dL (ref 6.0–8.3)

## 2021-12-14 LAB — TSH: TSH: 1.95 u[IU]/mL (ref 0.35–5.50)

## 2021-12-14 LAB — LIPID PANEL
Cholesterol: 135 mg/dL (ref 0–200)
HDL: 34.1 mg/dL — ABNORMAL LOW (ref >39.00)
LDL Cholesterol: 80 mg/dL (ref 0–99)
NonHDL: 100.66
Total CHOL/HDL Ratio: 4
Triglycerides: 105 mg/dL (ref 0.0–149.0)
VLDL: 21 mg/dL (ref 0.0–40.0)

## 2021-12-15 LAB — PRO B NATRIURETIC PEPTIDE: NT-Pro BNP: 969 pg/mL — ABNORMAL HIGH (ref 0–210)

## 2021-12-22 ENCOUNTER — Encounter: Payer: Self-pay | Admitting: Cardiovascular Disease

## 2021-12-22 ENCOUNTER — Ambulatory Visit
Admission: RE | Admit: 2021-12-22 | Discharge: 2021-12-22 | Disposition: A | Discharge status: Home / Self Care | Payer: Medicare HMO | Source: Ambulatory Visit | Attending: Cardiovascular Disease | Admitting: Cardiovascular Disease

## 2021-12-22 ENCOUNTER — Ambulatory Visit: Payer: Medicare HMO | Attending: Cardiovascular Disease | Admitting: Cardiovascular Disease

## 2021-12-22 VITALS — BP 130/80 | HR 104 | Ht 75.0 in | Wt 291.8 lb

## 2021-12-22 DIAGNOSIS — I4891 Unspecified atrial fibrillation: Secondary | ICD-10-CM

## 2021-12-22 DIAGNOSIS — E782 Mixed hyperlipidemia: Secondary | ICD-10-CM

## 2021-12-22 DIAGNOSIS — Z5181 Encounter for therapeutic drug level monitoring: Secondary | ICD-10-CM | POA: Diagnosis not present

## 2021-12-22 DIAGNOSIS — Z7901 Long term (current) use of anticoagulants: Secondary | ICD-10-CM

## 2021-12-22 DIAGNOSIS — I4819 Other persistent atrial fibrillation: Secondary | ICD-10-CM

## 2021-12-22 DIAGNOSIS — I10 Essential (primary) hypertension: Secondary | ICD-10-CM

## 2021-12-22 DIAGNOSIS — Z6836 Body mass index (BMI) 36.0-36.9, adult: Secondary | ICD-10-CM | POA: Diagnosis not present

## 2021-12-22 MED ORDER — METOPROLOL SUCCINATE ER 50 MG PO TB24: 50.0000 mg | ORAL_TABLET | Freq: Every day | ORAL | 3 refills | Status: DC

## 2021-12-22 MED ORDER — APIXABAN 5 MG PO TABS: 5.0000 mg | ORAL_TABLET | Freq: Two times a day (BID) | ORAL | 3 refills | Status: DC

## 2021-12-22 NOTE — Patient Instructions (Addendum)
Medication Instructions:  START Eliquis 5 mg twice daily  START Metoprolol Succinate 50 mg   *If you need a refill on your cardiac medications before your next appointment, please call your pharmacy*  Testing/Procedures:  Echocardiogram - Your physician has requested that you have an echocardiogram. Echocardiography is a painless test that uses sound waves to create images of your heart. It provides your doctor with information about the size and shape of your heart and how well your heart's chambers and valves are working. This procedure takes approximately one hour. There are no restrictions for this procedure.   Chest xray - Your physician has requested that you have a chest xray, is a fast and painless imaging test that uses certain electromagnetic waves to create pictures of the structures in and around your chest. This test can help diagnose and monitor conditions such as pneumonia and other lung issues his will be done at 1)  [facility]  Imaging [address], [place]. If you should need to call them their phone number is [phone]    2)  [facility]: [address], [place], [place] [address] [phone]  Your physician has recommended that you have a sleep study. This test records several body functions during sleep, including: brain activity, eye movement, oxygen and carbon dioxide blood levels, heart rate and rhythm, breathing rate and rhythm, the flow of air through your mouth and nose, snoring, body muscle movements, and chest and belly movement.   Follow-Up: At [facility], you and your health needs are our priority.  As part of our continuing mission to provide you with exceptional heart care, we have created designated Provider Care Teams.  These Care Teams include your primary Cardiologist (physician) and Advanced Practice Providers (APPs -  Physician Assistants and Nurse Practitioners) who all work together to provide you with the care you need, when you  need it.  We recommend signing up for the patient portal called "MyChart".  Sign up information is provided on this After Visit Summary.  MyChart is used to connect with patients for Virtual Visits (Telemedicine).  Patients are able to view lab/test results, encounter notes, upcoming appointments, etc.  Non-urgent messages can be sent to your provider as well.   To learn more about what you can do with MyChart, go to [email or web address].    Your next appointment:   3 week(s)  The format for your next appointment:   In Person  Provider:   Nicki Guadalajara, MD

## 2021-12-22 NOTE — Progress Notes (Signed)
Cardiology Office Note    Date:  12/28/2021   ID:  Parker Saunders, DOB 08/26/1965, MRN [id number]  PCP:  Parker Agreste, MD  Cardiologist:  Parker Majestic, MD   New cardiology consult referred by Dr. Cindee Lame for Atrial fibrillation   History of Present Illness:  Parker Saunders is a 56 y.o. male who is followed by Dr. Cindee Lame for primary care.  He has a history of hypertension and has been on losartan HCT 100/12.5 mg daily.  He also has a history of mild hyperlipidemia and has been on atorvastatin 20 mg daily.  Laboratory in May 2023 showed an LDL cholesterol at 79 with mild triglyceride elevation at 170.  Subsequent laboratory on December 14, 2021 showed total cholesterol 135, triglycerides 105, HDL 34, and LDL 80.  Over the past month, the patient had noticed some mild shortness of breath with activity and some occasional palpitations.  When evaluated by Dr. Nyoka Cowden on December 13, 2021 and ECG revealed atrial fibrillation with ventricular rate in the 80s.  In the office he was asymptomatic and denied any chest pain or shortness of breath.  Cardiology referral was made and the patient was advised to avoid exertional activities until cardiology evaluation.  Presently, he is unaware of atrial fibrillation but his heart rate has been in the 90-100 range.  He admits to a 3-year history of hypertension.  He has had difficulty with his back and as result sleeps in a recliner.  He admits to mild snoring, nocturia, as well as residual fatigue.  He typically goes to bed between 11 PM and midnight and wakes up at 5:15 AM during the week.  He denies any significant chest tightness.  He denies any leg swelling.  There is no presyncope or syncope.  He presents for cardiology consultation and evaluation.   Past Medical History:  Diagnosis Date   Hypertension    Renal stone    Rotator cuff disorder     Past Surgical History:  Procedure Laterality Date   BACK SURGERY  2005   LUMBAR  LAMINECTOMY/DECOMPRESSION MICRODISCECTOMY Left 02/04/2016   Procedure: REDO MICRO LUMBAR DECOMPRESSION L4-5, L5-S1 ON THE LEFT  2 LEVELS;  Surgeon: Susa Day, MD;  Location: [facility];  Service: Orthopedics;  Laterality: Left;   SHOULDER SURGERY Right 2014   SPINE SURGERY  02/07/2005   [facility]    Current Medications: Outpatient Medications Prior to Visit  Medication Sig Dispense Refill   atorvastatin (LIPITOR) 20 MG tablet Take 1 tablet (20 mg total) by mouth daily. 90 tablet 2   losartan-hydrochlorothiazide (HYZAAR) 100-12.5 MG tablet Take 1 tablet by mouth daily. 90 tablet 2   No facility-administered medications prior to visit.     Allergies:   Patient has no known allergies.   Social History   Socioeconomic History   Marital status: Married    Spouse name: Not on file   Number of children: 2   Years of education: Not on file   Highest education level: Not on file  Occupational History   Occupation: unemployed    Comment: since 2015  Tobacco Use   Smoking status: Never   Smokeless tobacco: Never  Substance and Sexual Activity   Alcohol use: No   Drug use: No   Sexual activity: Yes  Other Topics Concern   Not on file  Social History Narrative   Marital status: married      Children: 2 daughters  Lives: with wife, 2 daughters      Employment: unemployed after termination in 2015     Tobacco: none      Alcohol: none      Exercise:  Walking 4-5 miles daily   Social Determinants of Health   Financial Resource Strain: Low Risk  (07/15/2021)   Overall Financial Resource Strain (CARDIA)    Difficulty of Paying Living Expenses: Not hard at all  Food Insecurity: No Food Insecurity (07/15/2021)   Hunger Vital Sign    Worried About Running Out of Food in the Last Year: Never true    Ran Out of Food in the Last Year: Never true  Transportation Needs: No Transportation Needs (07/15/2021)   PRAPARE - Hydrologist (Medical): No    Lack  of Transportation (Non-Medical): No  Physical Activity: Insufficiently Active (07/15/2021)   Exercise Vital Sign    Days of Exercise per Week: 3 days    Minutes of Exercise per Session: 30 min  Stress: No Stress Concern Present (07/15/2021)   [place]    Feeling of Stress : Not at all  Social Connections: Moderately Integrated (07/15/2021)   Social Connection and Isolation Panel [NHANES]    Frequency of Communication with Friends and Family: Three times a week    Frequency of Social Gatherings with Friends and Family: Three times a week    Attends Religious Services: More than 4 times per year    Active Member of Clubs or Organizations: No    Attends Archivist Meetings: Never    Marital Status: Married    Socially he was born in [place].  He is married for 19 years.  He has 2 children ages 85 and 36.  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  He completed 12 grade of education.  There is no tobacco or alcohol use.  He does not routinely exercise mainly because he "just has no energy."  Family History:  The patient's relatives at family history includes COPD in his father; Hyperlipidemia in his father and mother; Hypertension in his father and mother.   Both parents are deceased.  His mother died at age 56 and had hypertension as well as pneumonia.  Father died at age 25 and had hypertension and emphysema.  He has a brother living at age 70 with hypertension, hyperlipidemia and kidney disease.  A brother died at age 4 with seizures.  He has a living sister age 69.  ROS General: Negative; No fevers, chills, or night sweats; has HEENT: Negative; No changes in vision or hearing, sinus congestion, difficulty swallowing Pulmonary: Negative; No cough, wheezing, shortness of breath, hemoptysis Cardiovascular: See HPI GI: Negative; No nausea, vomiting,  diarrhea, or abdominal pain GU: Negative; No dysuria, hematuria, or difficulty voiding Musculoskeletal: Negative; no myalgias, joint pain, or weakness Hematologic/Oncology: Negative; no easy bruising, bleeding Endocrine: Negative; no heat/cold intolerance; no diabetes Neuro: Negative; no changes in balance, headaches Skin: Negative; No rashes or skin lesions Psychiatric: Negative; No behavioral problems, depression Sleep: Negative; No snoring, daytime sleepiness, hypersomnolence, bruxism, restless legs, hypnogognic hallucinations, no cataplexy Other comprehensive 14 point system review is negative.   PHYSICAL EXAM:   VS:  BP 130/80   Pulse (!) 104   Ht _0  (1.905 m)   Wt 291 lb 12.8 oz (132.4 kg)   SpO2 96%   BMI 36.47 kg/m  Repeat blood pressure by me was 122/78  Wt Readings from Last 3 Encounters:  12/22/21 291 lb 12.8 oz (132.4 kg)  12/13/21 292 lb 6.4 oz (132.6 kg)  06/10/21 294 lb 12.8 oz (133.7 kg)    General: Alert, oriented, no distress.  Skin: normal turgor, no rashes, warm and dry HEENT: Normocephalic, atraumatic. Pupils equal round and reactive to light; sclera anicteric; extraocular muscles intact;  Nose without nasal septal hypertrophy Mouth/Parynx benign; Mallinpatti scale 2/3 Neck: No JVD, no carotid bruits; normal carotid upstroke Lungs: clear to ausculatation and percussion; no wheezing or rales Chest wall: without tenderness to palpitation Heart: PMI not displaced, RRR, s1 s2 normal, 1/6 systolic murmur, no diastolic murmur, no rubs, gallops, thrills, or heaves Abdomen: soft, nontender; no hepatosplenomehaly, BS+; abdominal aorta nontender and not dilated by palpation. Back: no CVA tenderness Pulses 2+ Musculoskeletal: full range of motion, normal strength, no joint deformities Extremities: no clubbing cyanosis or edema, Homan's sign negative  Neurologic: grossly nonfocal; Cranial nerves grossly wnl Psychologic: Normal mood and  affect   Studies/Labs Reviewed:   December 22, 2021 ECG (independently read by me): Atrial fibrillation at 104  I personally reviewed the ECG from December 13, 2021 which shows atrial fibrillation at 83 bpm.  Recent Labs:    Latest Ref Rng & Units 12/14/2021    8:50 AM 06/10/2021    3:27 PM 12/04/2020   11:03 AM  BMP  Glucose 70 - 99 mg/dL 96  82  103   BUN 6 - 23 mg/dL _0 Creatinine 0.40 - 1.50 mg/dL 1.14  1.19  1.22   Sodium 135 - 145 mEq/L 139  139  140   Potassium 3.5 - 5.1 mEq/L 4.3  4.1  4.1   Chloride 96 - 112 mEq/L 106  102  103   CO2 19 - 32 mEq/L _1 Calcium 8.4 - 10.5 mg/dL 9.4  9.8  9.4         Latest Ref Rng & Units 12/14/2021    8:50 AM 06/10/2021    3:27 PM 12/04/2020   11:03 AM  Hepatic Function  Total Protein 6.0 - 8.3 g/dL 7.2  7.0  7.1   Albumin 3.5 - 5.2 g/dL 4.3  4.4  4.3   AST 0 - 37 U/L _2 ALT 0 - 53 U/L 34  23  27   Alk Phosphatase 39 - 117 U/L 74  73  70   Total Bilirubin 0.2 - 1.2 mg/dL 0.5  0.6  0.7        Latest Ref Rng & Units 12/14/2021    8:50 AM 06/04/2020   11:05 AM 01/25/2016    9:17 AM  CBC  WBC 4.0 - 10.5 K/uL 8.6  7.3  7.6   Hemoglobin 13.0 - 17.0 g/dL 14.0  14.1  14.2   Hematocrit 39.0 - 52.0 % 42.1  40.5  40.9   Platelets 150.0 - 400.0 K/uL 269.0  261.0  289    Lab Results  Component Value Date   MCV 85.4 12/14/2021   MCV 84.6 06/04/2020   MCV 82.8 01/25/2016   Lab Results  Component Value Date   TSH 1.95 12/14/2021   Lab Results  Component Value Date   HGBA1C 5.4 06/10/2021     BNP No results found for: "BNP"  ProBNP    Component Value Date/Time   PROBNP 969 (H) 12/14/2021 0850  Lipid Panel     Component Value Date/Time   CHOL 135 12/14/2021 0850   CHOL 136 09/25/2019 1104   TRIG 105.0 12/14/2021 0850   HDL 34.10 (L) 12/14/2021 0850   HDL 30 (L) 09/25/2019 1104   CHOLHDL 4 12/14/2021 0850   VLDL 21.0 12/14/2021 0850   LDLCALC 80 12/14/2021 0850   LDLCALC 83  09/25/2019 1104   LABVLDL 23 09/25/2019 1104     RADIOLOGY: DG Chest 2 View  Result Date: 12/24/2021 CLINICAL DATA:  Atrial fibrillation. EXAM: CHEST - 2 VIEW COMPARISON:  CT chest 09/05/2012. FINDINGS: Trachea is midline.  Heart size normal.  Lungs are clear IMPRESSION: Negative.  Post Electronically Signed   By: Lorin Picket M.D.   On: 12/24/2021 10:58     Additional studies/ records that were reviewed today include:  Records of Dr. Cindee Lame were reviewed as well as recent laboratory.  ASSESSMENT:    1. Persistent atrial fibrillation ([place])   2. Alteration in anticoagulation   3. Essential hypertension   4. Class 2 severe obesity due to excess calories with serious comorbidity and body mass index (BMI) of 36.0 to 36.9 in adult ([place])   5. Mixed hyperlipidemia     PLAN:  Mr. Macarius Ruark is a 56 year old gentleman who has a history of moderate obesity, hypertension for at least 3 years, hyperlipidemia, and has chronic back discomfort for which he is on disability.  He has been on losartan HCT for hypertension as well as atorvastatin 20 mg for hyperlipidemia.  Over the past month, he has noticed that he is more short of breath with activity and when recently evaluated by Dr. Cindee Lame on December 13, 2021 and ECG showed atrial fibrillation with ventricular rate in the 80s.  His ECG today continues to show atrial fibrillation and his ventricular rate is increased at 104.  I discussed with him potential risk for thrombus formation and I am initiating Eliquis 5 mg twice a day.  His blood pressure today is stable and I will initiate metoprolol succinate 50 mg daily for improved rate control and potential reversion to sinus rhythm.  I am scheduling him for a 2D echo Doppler study to assess LV systolic and diastolic function as well as valvular architecture.  He will also have a PA and lateral chest x-ray.  He does not sleep in a bed because of his back issues and essentially sleeps in  a recliner.  He admits to mild snoring, nocturia, and fatigability.  He has a body habitus suggestive of possible obstructive sleep apnea and I will also schedule him for a sleep study.  Recent laboratory has shown a normal CBC, renal and LFT function.  TSH was normal at 1.95.  A proBNP was elevated at 969.  I will see him in 3 to 4 weeks for reevaluation and further recommendations will be made at that time.   Medication Adjustments/Labs and Tests Ordered: Current medicines are reviewed at length with the patient today.  Concerns regarding medicines are outlined above.  Medication changes, Labs and Tests ordered today are listed in the Patient Instructions below. Patient Instructions  Medication Instructions:  START Eliquis 5 mg twice daily  START Metoprolol Succinate 50 mg   *If you need a refill on your cardiac medications before your next appointment, please call your pharmacy*  Testing/Procedures:  Echocardiogram - Your physician has requested that you have an echocardiogram. Echocardiography is a painless test that uses sound waves to create images of your  heart. It provides your doctor with information about the size and shape of your heart and how well your heart's chambers and valves are working. This procedure takes approximately one hour. There are no restrictions for this procedure.   Chest xray - Your physician has requested that you have a chest xray, is a fast and painless imaging test that uses certain electromagnetic waves to create pictures of the structures in and around your chest. This test can help diagnose and monitor conditions such as pneumonia and other lung issues his will be done at 1)  [facility]  Imaging [address], [place]. If you should need to call them their phone number is [phone]    2)  [facility]: [address], [place], [place] [address] [phone]  Your physician has recommended that you have a sleep study. This test records several body  functions during sleep, including: brain activity, eye movement, oxygen and carbon dioxide blood levels, heart rate and rhythm, breathing rate and rhythm, the flow of air through your mouth and nose, snoring, body muscle movements, and chest and belly movement.   Follow-Up: At [facility], you and your health needs are our priority.  As part of our continuing mission to provide you with exceptional heart care, we have created designated Provider Care Teams.  These Care Teams include your primary Cardiologist (physician) and Advanced Practice Providers (APPs -  Physician Assistants and Nurse Practitioners) who all work together to provide you with the care you need, when you need it.  We recommend signing up for the patient portal called "MyChart".  Sign up information is provided on this After Visit Summary.  MyChart is used to connect with patients for Virtual Visits (Telemedicine).  Patients are able to view lab/test results, encounter notes, upcoming appointments, etc.  Non-urgent messages can be sent to your provider as well.   To learn more about what you can do with MyChart, go to [email or web address].    Your next appointment:   3 week(s)  The format for your next appointment:   In Person  Provider:   Shelva Majestic, MD           Signed, Parker Majestic, MD  12/28/2021 10:04 AM    [facility] [address] [place], [place], [place]  [address] Phone: [phone]

## 2021-12-28 ENCOUNTER — Encounter: Payer: Self-pay | Admitting: Cardiovascular Disease

## 2022-01-11 ENCOUNTER — Encounter: Payer: Self-pay | Admitting: Cardiovascular Disease

## 2022-01-11 ENCOUNTER — Ambulatory Visit: Payer: Medicare HMO | Attending: Cardiovascular Disease | Admitting: Cardiovascular Disease

## 2022-01-11 DIAGNOSIS — I4819 Other persistent atrial fibrillation: Secondary | ICD-10-CM

## 2022-01-11 DIAGNOSIS — R0683 Snoring: Secondary | ICD-10-CM

## 2022-01-11 DIAGNOSIS — Z6836 Body mass index (BMI) 36.0-36.9, adult: Secondary | ICD-10-CM | POA: Diagnosis not present

## 2022-01-11 DIAGNOSIS — I10 Essential (primary) hypertension: Secondary | ICD-10-CM | POA: Diagnosis not present

## 2022-01-11 DIAGNOSIS — E782 Mixed hyperlipidemia: Secondary | ICD-10-CM

## 2022-01-11 DIAGNOSIS — Z7901 Long term (current) use of anticoagulants: Secondary | ICD-10-CM

## 2022-01-11 NOTE — Progress Notes (Signed)
Cardiology Office Note    Date:  01/11/2022   ID:  Parker Saunders, DOB 1965/11/18, MRN [id number]  PCP:  Wendie Agreste, MD  Cardiologist:  Shelva Majestic, MD   3 week F/U cardiology evaluation initially referred by Dr. Cindee Lame for Atrial fibrillation   History of Present Illness:  Parker Saunders is a 56 y.o. male who is followed by Dr. Cindee Lame for primary care.  He has a history of hypertension and has been on losartan HCT 100/12.5 mg daily.  He also has a history of mild hyperlipidemia and has been on atorvastatin 20 mg daily.  Laboratory in May 2023 showed an LDL cholesterol at 79 with mild triglyceride elevation at 170.  Subsequent laboratory on December 14, 2021 showed total cholesterol 135, triglycerides 105, HDL 34, and LDL 80.  Over the past month, the patient had noticed some mild shortness of breath with activity and some occasional palpitations.  When evaluated by Dr. Nyoka Cowden on December 13, 2021 ECG revealed atrial fibrillation with ventricular rate in the 80s.  In the office he was asymptomatic and denied any chest pain or shortness of breath.  Cardiology referral was made and the patient was advised to avoid exertional activities until cardiology evaluation.  I saw for my initial evaluation on #15 2023.  At that time, he was unaware of atrial fibrillation and his heart rate was in the 90-100 range.  He has a 3-year history of hypertension.  He has had difficulty with his back and sleeps in a recliner but admits to snoring, nocturia, as well as residual fatigue.   He typically goes to bed between 11 PM and midnight and wakes up at 5:15 AM during the week.  He denies any significant chest tightness.  He denies any leg swelling.  There is no presyncope or syncope.  He presents for cardiology consultation and evaluation.  My evaluation, I recommended he undergo a 2D echo Doppler study, and initiated therapy with Eliquis 5 mg twice a day in addition to metoprolol succinate  50 mg daily.  Currently, he presented to the office today for follow-up evaluation but unfortunately has not yet had his echo Doppler study which is scheduled for January 21, 2022.  Is unaware of any heart rate irregularity.  He denies any chest pain.  He has continued to be on the metoprolol 50 mg daily in addition to Eliquis 5 mg twice a day and has been maintained on atorvastatin 20 mg in addition to losartan HCT 100/12.5 mg daily.  He presents for evaluation.   Past Medical History:  Diagnosis Date   Hypertension    Renal stone    Rotator cuff disorder     Past Surgical History:  Procedure Laterality Date   BACK SURGERY  2005   LUMBAR LAMINECTOMY/DECOMPRESSION MICRODISCECTOMY Left 02/04/2016   Procedure: REDO MICRO LUMBAR DECOMPRESSION L4-5, L5-S1 ON THE LEFT  2 LEVELS;  Surgeon: Susa Day, MD;  Location: [facility];  Service: Orthopedics;  Laterality: Left;   SHOULDER SURGERY Right 2014   SPINE SURGERY  02/07/2005   [facility]    Current Medications: Outpatient Medications Prior to Visit  Medication Sig Dispense Refill   apixaban (ELIQUIS) 5 MG TABS tablet Take 1 tablet (5 mg total) by mouth 2 (two) times daily. 60 tablet 3   atorvastatin (LIPITOR) 20 MG tablet Take 1 tablet (20 mg total) by mouth daily. 90 tablet 2   losartan-hydrochlorothiazide (HYZAAR) 100-12.5 MG tablet Take  1 tablet by mouth daily. 90 tablet 2   metoprolol succinate (TOPROL-XL) 50 MG 24 hr tablet Take 1 tablet (50 mg total) by mouth daily. Take with or immediately following a meal. 90 tablet 3   No facility-administered medications prior to visit.     Allergies:   Patient has no known allergies.   Social History   Socioeconomic History   Marital status: Married    Spouse name: Not on file   Number of children: 2   Years of education: Not on file   Highest education level: Not on file  Occupational History   Occupation: unemployed    Comment: since 2015  Tobacco Use   Smoking status: Never    Smokeless tobacco: Never  Substance and Sexual Activity   Alcohol use: No   Drug use: No   Sexual activity: Yes  Other Topics Concern   Not on file  Social History Narrative   Marital status: married      Children: 2 daughters      Lives: with wife, 2 daughters      Employment: unemployed after termination in 2015     Tobacco: none      Alcohol: none      Exercise:  Walking 4-5 miles daily   Social Determinants of Health   Financial Resource Strain: [place]  (07/15/2021)   Overall Financial Resource Strain (CARDIA)    Difficulty of Paying Living Expenses: Not hard at all  Food Insecurity: No Food Insecurity (07/15/2021)   Hunger Vital Sign    Worried About Running Out of Food in the Last Year: Never true    [place] in the Last Year: Never true  Transportation Needs: No Transportation Needs (07/15/2021)   PRAPARE - Hydrologist (Medical): No    Lack of Transportation (Non-Medical): No  Physical Activity: Insufficiently Active (07/15/2021)   Exercise Vital Sign    Days of Exercise per Week: 3 days    Minutes of Exercise per Session: 30 min  Stress: No Stress Concern Present (07/15/2021)   [employer]    Feeling of Stress : Not at all  Social Connections: Moderately Integrated (07/15/2021)   Social Connection and Isolation Panel [NHANES]    Frequency of Communication with Friends and Family: Three times a week    Frequency of Social Gatherings with Friends and Family: Three times a week    Attends Religious Services: More than 4 times per year    Active Member of Clubs or Organizations: No    Attends Archivist Meetings: Never    Marital Status: Married    Socially he was born in [place].  He is married for 19 years.  He has 2 children ages 5 and 14.  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  He completed  12 grade of education.  There is no tobacco or alcohol use.  He does not routinely exercise mainly because he "just has no energy."  Family History:  The patient's relatives at family history includes COPD in his father; Hyperlipidemia in his father and mother; Hypertension in his father and mother.   Both parents are deceased.  His mother died at age 51 and had hypertension as well as pneumonia.  Father died at age 41 and had hypertension and emphysema.  He has a brother living at age 28 with  hypertension, hyperlipidemia and kidney disease.  A brother died at age 34 with seizures.  He has a living sister age 87.  ROS General: Negative; No fevers, chills, or night sweats; has HEENT: Negative; No changes in vision or hearing, sinus congestion, difficulty swallowing Pulmonary: Negative; No cough, wheezing, shortness of breath, hemoptysis Cardiovascular: See HPI GI: Negative; No nausea, vomiting, diarrhea, or abdominal pain GU: Negative; No dysuria, hematuria, or difficulty voiding Musculoskeletal: Negative; no myalgias, joint pain, or weakness Hematologic/Oncology: Negative; no easy bruising, bleeding Endocrine: Negative; no heat/cold intolerance; no diabetes Neuro: Negative; no changes in balance, headaches Skin: Negative; No rashes or skin lesions Psychiatric: Negative; No behavioral problems, depression Sleep: Negative; No snoring, daytime sleepiness, hypersomnolence, bruxism, restless legs, hypnogognic hallucinations, no cataplexy Other comprehensive 14 point system review is negative.   PHYSICAL EXAM:   VS:  BP 124/78   Pulse 93   Ht _0  (1.905 m)   Wt 295 lb 6.4 oz (134 kg)   SpO2 98%   BMI 36.92 kg/m     Repeat blood pressure by me was 122/78.  Wt Readings from Last 3 Encounters:  01/11/22 295 lb 6.4 oz (134 kg)  12/22/21 291 lb 12.8 oz (132.4 kg)  12/13/21 292 lb 6.4 oz (132.6 kg)    General: Alert, oriented, no distress.  Skin: normal turgor, no rashes, warm and  dry HEENT: Normocephalic, atraumatic. Pupils equal round and reactive to light; sclera anicteric; extraocular muscles intact;  Nose without nasal septal hypertrophy Mouth/Parynx benign; Mallinpatti scale 3 Neck: No JVD, no carotid bruits; normal carotid upstroke Lungs: clear to ausculatation and percussion; no wheezing or rales Chest wall: without tenderness to palpitation Heart: PMI not displaced, irregular irregular with ventricular rate in the mid 90s, s1 s2 normal, 1/6 systolic murmur, no diastolic murmur, no rubs, gallops, thrills, or heaves Abdomen: soft, nontender; no hepatosplenomehaly, BS+; abdominal aorta nontender and not dilated by palpation. Back: no CVA tenderness Pulses 2+ Musculoskeletal: full range of motion, normal strength, no joint deformities Extremities: no clubbing cyanosis or edema, Homan's sign negative  Neurologic: grossly nonfocal; Cranial nerves grossly wnl Psychologic: Normal mood and affect    Studies/Labs Reviewed:   January 11, 2022 ECG (independently read by me): Atrial fibrillation at 95  December 22, 2021 ECG (independently read by me): Atrial fibrillation at 104  I personally reviewed the ECG from December 13, 2021 which shows atrial fibrillation at 83 bpm.  Recent Labs:    Latest Ref Rng & Units 12/14/2021    8:50 AM 06/10/2021    3:27 PM 12/04/2020   11:03 AM  BMP  Glucose 70 - 99 mg/dL 96  82  103   BUN 6 - 23 mg/dL _1 Creatinine 0.40 - 1.50 mg/dL 1.14  1.19  1.22   Sodium 135 - 145 mEq/L 139  139  140   Potassium 3.5 - 5.1 mEq/L 4.3  4.1  4.1   Chloride 96 - 112 mEq/L 106  102  103   CO2 19 - 32 mEq/L _2 Calcium 8.4 - 10.5 mg/dL 9.4  9.8  9.4         Latest Ref Rng & Units 12/14/2021    8:50 AM 06/10/2021    3:27 PM 12/04/2020   11:03 AM  Hepatic Function  Total Protein 6.0 - 8.3 g/dL 7.2  7.0  7.1   Albumin 3.5 - 5.2 g/dL 4.3  4.4  4.3  AST 0 - 37 U/L _0 ALT 0 - 53 U/L 34  23  27   Alk Phosphatase 39  - 117 U/L 74  73  70   Total Bilirubin 0.2 - 1.2 mg/dL 0.5  0.6  0.7        Latest Ref Rng & Units 12/14/2021    8:50 AM 06/04/2020   11:05 AM 01/25/2016    9:17 AM  CBC  WBC 4.0 - 10.5 K/uL 8.6  7.3  7.6   Hemoglobin 13.0 - 17.0 g/dL 14.0  14.1  14.2   Hematocrit 39.0 - 52.0 % 42.1  40.5  40.9   Platelets 150.0 - 400.0 K/uL 269.0  261.0  289    Lab Results  Component Value Date   MCV 85.4 12/14/2021   MCV 84.6 06/04/2020   MCV 82.8 01/25/2016   Lab Results  Component Value Date   TSH 1.95 12/14/2021   Lab Results  Component Value Date   HGBA1C 5.4 06/10/2021     BNP No results found for: "BNP"  ProBNP    Component Value Date/Time   PROBNP 969 (H) 12/14/2021 0850     Lipid Panel     Component Value Date/Time   CHOL 135 12/14/2021 0850   CHOL 136 09/25/2019 1104   TRIG 105.0 12/14/2021 0850   HDL 34.10 (L) 12/14/2021 0850   HDL 30 (L) 09/25/2019 1104   CHOLHDL 4 12/14/2021 0850   VLDL 21.0 12/14/2021 0850   LDLCALC 80 12/14/2021 0850   LDLCALC 83 09/25/2019 1104   LABVLDL 23 09/25/2019 1104     RADIOLOGY: DG Chest 2 View  Result Date: 12/24/2021 CLINICAL DATA:  Atrial fibrillation. EXAM: CHEST - 2 VIEW COMPARISON:  CT chest 09/05/2012. FINDINGS: Trachea is midline.  Heart size normal.  Lungs are clear IMPRESSION: Negative.  Post Electronically Signed   By: Lorin Picket M.D.   On: 12/24/2021 10:58     Additional studies/ records that were reviewed today include:  Records of Dr. Cindee Lame were reviewed as well as recent laboratory.  ASSESSMENT:    1. Persistent atrial fibrillation ([place])   2. Anticoagulated   3. Essential hypertension   4. Class 2 severe obesity due to excess calories with serious comorbidity and body mass index (BMI) of 36.0 to 36.9 in adult ([place])   5. Mixed hyperlipidemia   6. Snoring     PLAN:  Mr. Kimble Hitchens is a 56 year old gentleman who has a history of moderate obesity, hypertension for at least 3 years,  hyperlipidemia, and has chronic back discomfort for which he is on disability.  He has been on losartan HCT for hypertension as well as atorvastatin 20 mg for hyperlipidemia.  Over the past month, he has noticed that he is more short of breath with activity and when evaluated by Dr. Cindee Lame on December 13, 2021 his ECG showed atrial fibrillation with ventricular rate in the 80s.  At my initial evaluation on December 22, 2021, ECG continue to show atrial fibrillation with ventricular rate at 104 bpm.  At that time I had a lengthy discussion with him and particularly discussed potential risk for thrombus formation and need for anticoagulation.  I initiated Eliquis 5 mg twice a day, and also initiated metoprolol succinate 50 mg daily for improved rate control with potential reversion to sinus rhythm.  I had plan for him to have 2D echo Doppler study with subsequent office visit evaluation.  Apparently he  was on my schedule today, 3 weeks later but has not yet had his echo Doppler evaluation which is scheduled for next Friday, January 21, 2022.  Consequently, knowledge of his LV function and atrial dimensions, I do not feel comfortable initiating antiarrhythmic therapy without this information.  Presently I will further titrate metoprolol to 75 mg daily for the next 4 days and as long as his heart rate remains elevated and blood pressure is not low he will then titrate this to 100 mg daily.  He will undergo his echo Doppler study on January 21, 2022.  I am scheduling him for atrial fibrillation clinic the following week at which time we can determine if he has converted to sinus rhythm and his rate control. I  will be out that week with the Christmas holiday and at that time plans can be made to set up for DC cardioversion versus initiation of antiarrhythmic therapy.  Ultimately, he will need to undergo a sleep study for evaluation of sleep apnea and I will schedule this at my follow-up  evaluation.   Medication Adjustments/Labs and Tests Ordered: Current medicines are reviewed at length with the patient today.  Concerns regarding medicines are outlined above.  Medication changes, Labs and Tests ordered today are listed in the Patient Instructions below. Patient Instructions  Medication Instructions:  INCREASE- Metoprolol Succinate 75 mg (1 1/2 tablets) daily for 4 days, Keep daily check of blood pressure and heart rate. if heart rate stays above 60 and blood pressure remain stable increase 100 mg daily  *If you need a refill on your cardiac medications before your next appointment, please call your pharmacy*   Lab Work: None Ordered   Testing/Procedures: None Ordered   Follow-Up: At [facility], you and your health needs are our priority.  As part of our continuing mission to provide you with exceptional heart care, we have created designated Provider Care Teams.  These Care Teams include your primary Cardiologist (physician) and Advanced Practice Providers (APPs -  Physician Assistants and Nurse Practitioners) who all work together to provide you with the care you need, when you need it.  We recommend signing up for the patient portal called "MyChart".  Sign up information is provided on this After Visit Summary.  MyChart is used to connect with patients for Virtual Visits (Telemedicine).  Patients are able to view lab/test results, encounter notes, upcoming appointments, etc.  Non-urgent messages can be sent to your provider as well.   To learn more about what you can do with MyChart, go to [email or web address].    Your next appointment:   2 week(s)  The format for your next appointment:   In Person  Provider:   [facility]           Signed, Shelva Majestic, MD  01/11/2022 6:53 PM    [facility] [address], [place], [place], [place]  [address] Phone: [phone]

## 2022-01-11 NOTE — Patient Instructions (Signed)
Medication Instructions:  INCREASE- Metoprolol Succinate 75 mg (1 1/2 tablets) daily for 4 days, Keep daily check of blood pressure and heart rate. if heart rate stays above 60 and blood pressure remain stable increase 100 mg daily  *If you need a refill on your cardiac medications before your next appointment, please call your pharmacy*   Lab Work: None Ordered   Testing/Procedures: None Ordered   Follow-Up: At [facility], you and your health needs are our priority.  As part of our continuing mission to provide you with exceptional heart care, we have created designated Provider Care Teams.  These Care Teams include your primary Cardiologist (physician) and Advanced Practice Providers (APPs -  Physician Assistants and Nurse Practitioners) who all work together to provide you with the care you need, when you need it.  We recommend signing up for the patient portal called "MyChart".  Sign up information is provided on this After Visit Summary.  MyChart is used to connect with patients for Virtual Visits (Telemedicine).  Patients are able to view lab/test results, encounter notes, upcoming appointments, etc.  Non-urgent messages can be sent to your provider as well.   To learn more about what you can do with MyChart, go to [email or web address].    Your next appointment:   2 week(s)  The format for your next appointment:   In Person  Provider:   [facility]

## 2022-01-16 ENCOUNTER — Encounter: Payer: Self-pay | Admitting: Cardiovascular Disease

## 2022-01-19 ENCOUNTER — Other Ambulatory Visit: Payer: Self-pay | Admitting: Family Medicine

## 2022-01-19 DIAGNOSIS — I10 Essential (primary) hypertension: Secondary | ICD-10-CM

## 2022-01-21 ENCOUNTER — Ambulatory Visit (HOSPITAL_COMMUNITY): Payer: Medicare HMO | Attending: Cardiovascular Disease

## 2022-01-21 DIAGNOSIS — I4819 Other persistent atrial fibrillation: Secondary | ICD-10-CM | POA: Diagnosis not present

## 2022-01-21 LAB — ECHOCARDIOGRAM COMPLETE
Area-P 1/2: 3.23 cm2
S' Lateral: 2.7 cm

## 2022-01-25 ENCOUNTER — Ambulatory Visit (HOSPITAL_COMMUNITY)
Admission: RE | Admit: 2022-01-25 | Discharge: 2022-01-25 | Disposition: A | Discharge status: Home / Self Care | Payer: Medicare HMO | Source: Ambulatory Visit | Attending: Physician Assistant | Admitting: Physician Assistant

## 2022-01-25 VITALS — BP 118/84 | HR 85 | Ht 75.0 in | Wt 291.0 lb

## 2022-01-25 DIAGNOSIS — Z6836 Body mass index (BMI) 36.0-36.9, adult: Secondary | ICD-10-CM | POA: Insufficient documentation

## 2022-01-25 DIAGNOSIS — E785 Hyperlipidemia, unspecified: Secondary | ICD-10-CM | POA: Diagnosis not present

## 2022-01-25 DIAGNOSIS — I10 Essential (primary) hypertension: Secondary | ICD-10-CM | POA: Insufficient documentation

## 2022-01-25 DIAGNOSIS — E669 Obesity, unspecified: Secondary | ICD-10-CM | POA: Diagnosis not present

## 2022-01-25 DIAGNOSIS — I4819 Other persistent atrial fibrillation: Secondary | ICD-10-CM | POA: Insufficient documentation

## 2022-01-25 DIAGNOSIS — G4733 Obstructive sleep apnea (adult) (pediatric): Secondary | ICD-10-CM | POA: Diagnosis not present

## 2022-01-25 NOTE — H&P (View-Only) (Signed)
Primary Care Physician: Shade Flood, MD Primary Cardiologist: Dr Tresa Endo Primary Electrophysiologist: none Referring Physician: Dr Tresa Endo   Parker Saunders is a 56 y.o. male with a history of HTN, HLD, atrial fibrillation who presents for consultation in the [facility].  The patient was initially diagnosed with atrial fibrillation 12/2021 at a routine physical with his PCP. An irregular heart beat was noted on auscultation and and ECG showed afib. He was referred to cardiology and seen by Dr Tresa Endo who started Eliquis for a CHADS2VASC score of 1 and metoprolol for rate control. Patient does admit to snoring. No significant alcohol use. He denies palpitations but does feel that he has had more fatigue and dyspnea with exertion lately.   Today, he denies symptoms of palpitations, chest pain, orthopnea, PND, lower extremity edema, dizziness, presyncope, syncope, snoring, daytime somnolence, bleeding, or neurologic sequela. The patient is tolerating medications without difficulties and is otherwise without complaint today.    Atrial Fibrillation Risk Factors:  he does have symptoms or diagnosis of sleep apnea. he is agreeable to sleep study.  he does not have a history of rheumatic fever. he does not have a history of alcohol use. The patient does not have a history of early familial atrial fibrillation or other arrhythmias.  he has a BMI of Body mass index is 36.37 kg/m.[name] Filed Weights   01/25/22 1509  Weight: 132 kg    Family History  Problem Relation Age of Onset   Hyperlipidemia Mother    Hypertension Mother    COPD Father    Hyperlipidemia Father    Hypertension Father      Atrial Fibrillation Management history:  Previous antiarrhythmic drugs: none Previous cardioversions: none Previous ablations: none CHADS2VASC score: 1 Anticoagulation history: Eliquis   Past Medical History:  Diagnosis Date   Hypertension    Renal stone     Rotator cuff disorder    Past Surgical History:  Procedure Laterality Date   BACK SURGERY  2005   LUMBAR LAMINECTOMY/DECOMPRESSION MICRODISCECTOMY Left 02/04/2016   Procedure: REDO MICRO LUMBAR DECOMPRESSION L4-5, L5-S1 ON THE LEFT  2 LEVELS;  Surgeon: [name], MD;  Location: [facility];  Service: Orthopedics;  Laterality: Left;   SHOULDER SURGERY Right 2014   SPINE SURGERY  02/07/2005   Tomasita Crumble    Current Outpatient Medications  Medication Sig Dispense Refill   Acetaminophen (TYLENOL PO) Take 500 mg by mouth as needed.     apixaban (ELIQUIS) 5 MG TABS tablet Take 1 tablet (5 mg total) by mouth 2 (two) times daily. 60 tablet 3   atorvastatin (LIPITOR) 20 MG tablet Take 1 tablet (20 mg total) by mouth daily. 90 tablet 2   losartan-hydrochlorothiazide (HYZAAR) 100-12.5 MG tablet TAKE 1 TABLET BY MOUTH DAILY 90 tablet 2   metoprolol succinate (TOPROL-XL) 50 MG 24 hr tablet Take 1 tablet (50 mg total) by mouth daily. Take with or immediately following a meal. 90 tablet 3   No current facility-administered medications for this encounter.    No Known Allergies  Social History   Socioeconomic History   Marital status: Married    Spouse name: Not on file   Number of children: 2   Years of education: Not on file   Highest education level: Not on file  Occupational History   Occupation: unemployed    Comment: since 2015  Tobacco Use   Smoking status: Never   Smokeless tobacco: Never  Substance and Sexual Activity  Alcohol use: No   Drug use: No   Sexual activity: Yes  Other Topics Concern   Not on file  Social History Narrative   Marital status: married      Children: 2 daughters      Lives: with wife, 2 daughters      Employment: unemployed after termination in 2015     Tobacco: none      Alcohol: none      Exercise:  Walking 4-5 miles daily   Social Determinants of Health   Financial Resource Strain: Low Risk  (07/15/2021)   Overall Financial Resource Strain  (CARDIA)    Difficulty of Paying Living Expenses: Not hard at all  Food Insecurity: No Food Insecurity (07/15/2021)   Hunger Vital Sign    Worried About Running Out of Food in the Last Year: Never true    Ran Out of Food in the Last Year: Never true  Transportation Needs: No Transportation Needs (07/15/2021)   PRAPARE - Hydrologist (Medical): No    Lack of Transportation (Non-Medical): No  Physical Activity: Insufficiently Active (07/15/2021)   Exercise Vital Sign    Days of Exercise per Week: 3 days    Minutes of Exercise per Session: 30 min  Stress: No Stress Concern Present (07/15/2021)   [place]    Feeling of Stress : Not at all  Social Connections: Moderately Integrated (07/15/2021)   Social Connection and Isolation Panel [NHANES]    Frequency of Communication with Friends and Family: Three times a week    Frequency of Social Gatherings with Friends and Family: Three times a week    Attends Religious Services: More than 4 times per year    Active Member of Clubs or Organizations: No    Attends Archivist Meetings: Never    Marital Status: Married  Human resources officer Violence: Not At Risk (07/15/2021)   Humiliation, Afraid, Rape, and Kick questionnaire    Fear of Current or Ex-Partner: No    Emotionally Abused: No    Physically Abused: No    Sexually Abused: No     ROS- All systems are reviewed and negative except as per the HPI above.  Physical Exam: Vitals:   01/25/22 1509  BP: 118/84  Pulse: 85  Weight: 132 kg  Height: 6\' 3"  (1.905 m)    GEN- The patient is a well appearing obese male, alert and oriented x 3 today.   Head- normocephalic, atraumatic Eyes-  Sclera clear, conjunctiva pink Ears- hearing intact Oropharynx- clear Neck- supple  Lungs- Clear to ausculation bilaterally, normal work of breathing Heart- irregular rate and rhythm, no murmurs, rubs or gallops   GI- soft, NT, ND, + BS Extremities- no clubbing, cyanosis, or edema MS- no significant deformity or atrophy Skin- no rash or lesion Psych- euthymic mood, full affect Neuro- strength and sensation are intact  Wt Readings from Last 3 Encounters:  01/25/22 132 kg  01/11/22 134 kg  12/22/21 132.4 kg    EKG today demonstrates  Afib Vent. rate 85 BPM PR interval * ms QRS duration 90 ms QT/QTcB 352/418 ms  Echo 01/21/22 demonstrated   1. Left ventricular ejection fraction, by estimation, is 60 to 65%. The  left ventricle has normal function. The left ventricle has no regional  wall motion abnormalities. There is mild left ventricular hypertrophy of  the basal-septal segment. Left ventricular diastolic function could not be evaluated.   2.  Right ventricular systolic function is normal. The right ventricular  size is normal. There is normal pulmonary artery systolic pressure. The  estimated right ventricular systolic pressure is [id number] mmHg.   3. Left atrial size was mildly dilated.   4. The mitral valve is normal in structure. Trivial mitral valve  regurgitation. No evidence of mitral stenosis.   5. The aortic valve is normal in structure. Aortic valve regurgitation is  not visualized. No aortic stenosis is present.   6. Aortic dilatation noted. There is borderline dilatation of the aortic  root, measuring 37 mm.   7. The inferior vena cava is normal in size with greater than 50%  respiratory variability, suggesting right atrial pressure of 3 mmHg.   Epic records are reviewed at length today  CHA2DS2-VASc Score = 1  The patient's score is based upon: CHF History: 0 HTN History: 1 Diabetes History: 0 Stroke History: 0 Vascular Disease History: 0 Age Score: 0 Gender Score: 0       ASSESSMENT AND PLAN: 1. Persistent Atrial Fibrillation (ICD10:  I48.19) The patient's CHA2DS2-VASc score is 1, indicating a 0.6% annual risk of stroke.   Patient remains in rate controlled  afib. We discussed rhythm control options today. Will plan for DCCV.  Patient reports that he has been taking both doses of his Eliquis in the morning instead of BID. Stressed importance of taking twice daily. Will arrange for DCCV after 3 weeks of anticoagulation. Continue Eliquis 5 mg BID Continue Toprol 50 mg daily   2. HTN Stable, no changes today.  3. Obesity Body mass index is 36.37 kg/m. Lifestyle modification was discussed at length including regular exercise and weight reduction.  4. Suspected Obstructive sleep apnea The importance of adequate treatment of sleep apnea was discussed today in order to improve our ability to maintain sinus rhythm long term. Per Dr Evette Georges note, his office will arrange sleep study.   Follow up in the AF clinic post DCCV.    [facility] [address] [place], [place] [address] [phone] 01/25/2022 3:51 PM

## 2022-01-25 NOTE — Patient Instructions (Signed)
Take Eliquis 5mg  twice a day    Cardioversion scheduled for: Thursday, January 11th   - Arrive at the January 13 and go to admitting at [facility]   - Do not eat or drink anything after midnight the night prior to your procedure.   - Take all your morning medication (except diabetic medications) with a sip of water prior to arrival. - You will not be able to drive home after your procedure.    - Do NOT miss any doses of your blood thinner - if you should miss a dose please notify our office immediately.   - If you feel as if you go back into normal rhythm prior to scheduled cardioversion, please notify our office immediately.  If your procedure is canceled in the cardioversion suite you will be charged a cancellation fee.

## 2022-01-25 NOTE — Progress Notes (Signed)
Primary Care Physician: Shade Flood, MD Primary Cardiologist: Dr Tresa Endo Primary Electrophysiologist: none Referring Physician: Dr Tresa Endo   Parker Saunders is a 56 y.o. male with a history of HTN, HLD, atrial fibrillation who presents for consultation in the [facility].  The patient was initially diagnosed with atrial fibrillation 12/2021 at a routine physical with his PCP. An irregular heart beat was noted on auscultation and and ECG showed afib. He was referred to cardiology and seen by Dr Tresa Endo who started Eliquis for a CHADS2VASC score of 1 and metoprolol for rate control. Patient does admit to snoring. No significant alcohol use. He denies palpitations but does feel that he has had more fatigue and dyspnea with exertion lately.   Today, he denies symptoms of palpitations, chest pain, orthopnea, PND, lower extremity edema, dizziness, presyncope, syncope, snoring, daytime somnolence, bleeding, or neurologic sequela. The patient is tolerating medications without difficulties and is otherwise without complaint today.    Atrial Fibrillation Risk Factors:  he does have symptoms or diagnosis of sleep apnea. he is agreeable to sleep study.  he does not have a history of rheumatic fever. he does not have a history of alcohol use. The patient does not have a history of early familial atrial fibrillation or other arrhythmias.  he has a BMI of Body mass index is 36.37 kg/m.[name] Filed Weights   01/25/22 1509  Weight: 132 kg    Family History  Problem Relation Age of Onset   Hyperlipidemia Mother    Hypertension Mother    COPD Father    Hyperlipidemia Father    Hypertension Father      Atrial Fibrillation Management history:  Previous antiarrhythmic drugs: none Previous cardioversions: none Previous ablations: none CHADS2VASC score: 1 Anticoagulation history: Eliquis   Past Medical History:  Diagnosis Date   Hypertension    Renal stone     Rotator cuff disorder    Past Surgical History:  Procedure Laterality Date   BACK SURGERY  2005   LUMBAR LAMINECTOMY/DECOMPRESSION MICRODISCECTOMY Left 02/04/2016   Procedure: REDO MICRO LUMBAR DECOMPRESSION L4-5, L5-S1 ON THE LEFT  2 LEVELS;  Surgeon: [name], MD;  Location: [facility];  Service: Orthopedics;  Laterality: Left;   SHOULDER SURGERY Right 2014   SPINE SURGERY  02/07/2005   Tomasita Crumble    Current Outpatient Medications  Medication Sig Dispense Refill   Acetaminophen (TYLENOL PO) Take 500 mg by mouth as needed.     apixaban (ELIQUIS) 5 MG TABS tablet Take 1 tablet (5 mg total) by mouth 2 (two) times daily. 60 tablet 3   atorvastatin (LIPITOR) 20 MG tablet Take 1 tablet (20 mg total) by mouth daily. 90 tablet 2   losartan-hydrochlorothiazide (HYZAAR) 100-12.5 MG tablet TAKE 1 TABLET BY MOUTH DAILY 90 tablet 2   metoprolol succinate (TOPROL-XL) 50 MG 24 hr tablet Take 1 tablet (50 mg total) by mouth daily. Take with or immediately following a meal. 90 tablet 3   No current facility-administered medications for this encounter.    No Known Allergies  Social History   Socioeconomic History   Marital status: Married    Spouse name: Not on file   Number of children: 2   Years of education: Not on file   Highest education level: Not on file  Occupational History   Occupation: unemployed    Comment: since 2015  Tobacco Use   Smoking status: Never   Smokeless tobacco: Never  Substance and Sexual Activity  Alcohol use: No   Drug use: No   Sexual activity: Yes  Other Topics Concern   Not on file  Social History Narrative   Marital status: married      Children: 2 daughters      Lives: with wife, 2 daughters      Employment: unemployed after termination in 2015     Tobacco: none      Alcohol: none      Exercise:  Walking 4-5 miles daily   Social Determinants of Health   Financial Resource Strain: Low Risk  (07/15/2021)   Overall Financial Resource Strain  (CARDIA)    Difficulty of Paying Living Expenses: Not hard at all  Food Insecurity: No Food Insecurity (07/15/2021)   Hunger Vital Sign    Worried About Running Out of Food in the Last Year: Never true    Ran Out of Food in the Last Year: Never true  Transportation Needs: No Transportation Needs (07/15/2021)   PRAPARE - Hydrologist (Medical): No    Lack of Transportation (Non-Medical): No  Physical Activity: Insufficiently Active (07/15/2021)   Exercise Vital Sign    Days of Exercise per Week: 3 days    Minutes of Exercise per Session: 30 min  Stress: No Stress Concern Present (07/15/2021)   [place]    Feeling of Stress : Not at all  Social Connections: Moderately Integrated (07/15/2021)   Social Connection and Isolation Panel [NHANES]    Frequency of Communication with Friends and Family: Three times a week    Frequency of Social Gatherings with Friends and Family: Three times a week    Attends Religious Services: More than 4 times per year    Active Member of Clubs or Organizations: No    Attends Archivist Meetings: Never    Marital Status: Married  Human resources officer Violence: Not At Risk (07/15/2021)   Humiliation, Afraid, Rape, and Kick questionnaire    Fear of Current or Ex-Partner: No    Emotionally Abused: No    Physically Abused: No    Sexually Abused: No     ROS- All systems are reviewed and negative except as per the HPI above.  Physical Exam: Vitals:   01/25/22 1509  BP: 118/84  Pulse: 85  Weight: 132 kg  Height: 6\' 3"  (1.905 m)    GEN- The patient is a well appearing obese male, alert and oriented x 3 today.   Head- normocephalic, atraumatic Eyes-  Sclera clear, conjunctiva pink Ears- hearing intact Oropharynx- clear Neck- supple  Lungs- Clear to ausculation bilaterally, normal work of breathing Heart- irregular rate and rhythm, no murmurs, rubs or gallops   GI- soft, NT, ND, + BS Extremities- no clubbing, cyanosis, or edema MS- no significant deformity or atrophy Skin- no rash or lesion Psych- euthymic mood, full affect Neuro- strength and sensation are intact  Wt Readings from Last 3 Encounters:  01/25/22 132 kg  01/11/22 134 kg  12/22/21 132.4 kg    EKG today demonstrates  Afib Vent. rate 85 BPM PR interval * ms QRS duration 90 ms QT/QTcB 352/418 ms  Echo 01/21/22 demonstrated   1. Left ventricular ejection fraction, by estimation, is 60 to 65%. The  left ventricle has normal function. The left ventricle has no regional  wall motion abnormalities. There is mild left ventricular hypertrophy of  the basal-septal segment. Left ventricular diastolic function could not be evaluated.   2.  Right ventricular systolic function is normal. The right ventricular  size is normal. There is normal pulmonary artery systolic pressure. The  estimated right ventricular systolic pressure is [id number] mmHg.   3. Left atrial size was mildly dilated.   4. The mitral valve is normal in structure. Trivial mitral valve  regurgitation. No evidence of mitral stenosis.   5. The aortic valve is normal in structure. Aortic valve regurgitation is  not visualized. No aortic stenosis is present.   6. Aortic dilatation noted. There is borderline dilatation of the aortic  root, measuring 37 mm.   7. The inferior vena cava is normal in size with greater than 50%  respiratory variability, suggesting right atrial pressure of 3 mmHg.   Epic records are reviewed at length today  CHA2DS2-VASc Score = 1  The patient's score is based upon: CHF History: 0 HTN History: 1 Diabetes History: 0 Stroke History: 0 Vascular Disease History: 0 Age Score: 0 Gender Score: 0       ASSESSMENT AND PLAN: 1. Persistent Atrial Fibrillation (ICD10:  I48.19) The patient's CHA2DS2-VASc score is 1, indicating a 0.6% annual risk of stroke.   Patient remains in rate controlled  afib. We discussed rhythm control options today. Will plan for DCCV.  Patient reports that he has been taking both doses of his Eliquis in the morning instead of BID. Stressed importance of taking twice daily. Will arrange for DCCV after 3 weeks of anticoagulation. Continue Eliquis 5 mg BID Continue Toprol 50 mg daily   2. HTN Stable, no changes today.  3. Obesity Body mass index is 36.37 kg/m. Lifestyle modification was discussed at length including regular exercise and weight reduction.  4. Suspected Obstructive sleep apnea The importance of adequate treatment of sleep apnea was discussed today in order to improve our ability to maintain sinus rhythm long term. Per Dr Evette Georges note, his office will arrange sleep study.   Follow up in the AF clinic post DCCV.    [facility] [address] [place], [place] [address] [phone] 01/25/2022 3:51 PM

## 2022-02-14 ENCOUNTER — Ambulatory Visit (HOSPITAL_COMMUNITY)
Admission: RE | Admit: 2022-02-14 | Discharge: 2022-02-14 | Disposition: A | Discharge status: Home / Self Care | Payer: Medicare HMO | Source: Ambulatory Visit | Attending: Physician Assistant | Admitting: Physician Assistant

## 2022-02-14 DIAGNOSIS — I4819 Other persistent atrial fibrillation: Secondary | ICD-10-CM | POA: Diagnosis not present

## 2022-02-14 LAB — CBC
HCT: 42.8 % (ref 39.0–52.0)
Hemoglobin: 14.4 g/dL (ref 13.0–17.0)
MCH: 29 pg (ref 26.0–34.0)
MCHC: 33.6 g/dL (ref 30.0–36.0)
MCV: 86.1 fL (ref 80.0–100.0)
Platelets: 295 10*3/uL (ref 150–400)
RBC: 4.97 MIL/uL (ref 4.22–5.81)
RDW: 12.7 % (ref 11.5–15.5)
WBC: 9.3 10*3/uL (ref 4.0–10.5)
nRBC: 0 % (ref 0.0–0.2)

## 2022-02-14 LAB — BASIC METABOLIC PANEL
Anion gap: 10 (ref 5–15)
BUN: 15 mg/dL (ref 6–20)
CO2: 22 mmol/L (ref 22–32)
Calcium: 8.8 mg/dL — ABNORMAL LOW (ref 8.9–10.3)
Chloride: 106 mmol/L (ref 98–111)
Creatinine, Ser: 1.13 mg/dL (ref 0.61–1.24)
GFR, Estimated: >60 mL/min (ref >60)
Glucose, Bld: 91 mg/dL (ref 70–99)
Potassium: 3.7 mmol/L (ref 3.5–5.1)
Sodium: 138 mmol/L (ref 135–145)

## 2022-02-17 ENCOUNTER — Other Ambulatory Visit: Payer: Self-pay

## 2022-02-17 ENCOUNTER — Ambulatory Visit (HOSPITAL_COMMUNITY)
Admission: RE | Admit: 2022-02-17 | Discharge: 2022-02-17 | Disposition: A | Discharge status: Home / Self Care | Payer: Medicare HMO | Attending: Internal Medicine | Admitting: Internal Medicine

## 2022-02-17 ENCOUNTER — Ambulatory Visit (HOSPITAL_COMMUNITY): Payer: Medicare HMO | Admitting: Anesthesiology

## 2022-02-17 ENCOUNTER — Encounter (HOSPITAL_COMMUNITY): Payer: Self-pay | Admitting: Internal Medicine

## 2022-02-17 ENCOUNTER — Encounter (HOSPITAL_COMMUNITY)
Admission: RE | Disposition: A | Discharge status: Home / Self Care | Payer: Self-pay | Source: Home / Self Care | Attending: Internal Medicine

## 2022-02-17 ENCOUNTER — Ambulatory Visit (HOSPITAL_BASED_OUTPATIENT_CLINIC_OR_DEPARTMENT_OTHER): Payer: Medicare HMO | Admitting: Anesthesiology

## 2022-02-17 DIAGNOSIS — E785 Hyperlipidemia, unspecified: Secondary | ICD-10-CM | POA: Insufficient documentation

## 2022-02-17 DIAGNOSIS — I10 Essential (primary) hypertension: Secondary | ICD-10-CM | POA: Insufficient documentation

## 2022-02-17 DIAGNOSIS — I4819 Other persistent atrial fibrillation: Secondary | ICD-10-CM | POA: Diagnosis not present

## 2022-02-17 DIAGNOSIS — I4891 Unspecified atrial fibrillation: Secondary | ICD-10-CM

## 2022-02-17 DIAGNOSIS — Z6836 Body mass index (BMI) 36.0-36.9, adult: Secondary | ICD-10-CM | POA: Insufficient documentation

## 2022-02-17 DIAGNOSIS — E669 Obesity, unspecified: Secondary | ICD-10-CM | POA: Insufficient documentation

## 2022-02-17 DIAGNOSIS — N289 Disorder of kidney and ureter, unspecified: Secondary | ICD-10-CM | POA: Diagnosis not present

## 2022-02-17 DIAGNOSIS — Z7901 Long term (current) use of anticoagulants: Secondary | ICD-10-CM | POA: Insufficient documentation

## 2022-02-17 HISTORY — PX: CARDIOVERSION: SHX1299

## 2022-02-17 SURGERY — CARDIOVERSION
Anesthesia: General

## 2022-02-17 MED ORDER — LIDOCAINE 2% (20 MG/ML) 5 ML SYRINGE
INTRAMUSCULAR | Status: DC | PRN
  Administered 2022-02-17: 40 mg via INTRAVENOUS

## 2022-02-17 MED ORDER — SODIUM CHLORIDE 0.9 % IV SOLN: INTRAVENOUS | Status: DC

## 2022-02-17 MED ORDER — PROPOFOL 10 MG/ML IV BOLUS
INTRAVENOUS | Status: DC | PRN
  Administered 2022-02-17: 50 mg via INTRAVENOUS
  Administered 2022-02-17: 70 mg via INTRAVENOUS

## 2022-02-17 NOTE — Anesthesia Preprocedure Evaluation (Signed)
Anesthesia Evaluation  Patient identified by MRN, date of birth, ID band Patient awake    Reviewed: Allergy & Precautions, NPO status , Patient's Chart, lab work & pertinent test results  Airway Mallampati: I       Dental  (+) Edentulous Lower, Edentulous Upper   Pulmonary neg pulmonary ROS   breath sounds clear to auscultation       Cardiovascular hypertension, Pt. on medications and Pt. on home beta blockers  Rhythm:Irregular Rate:Abnormal  Echo: 1. Left ventricular ejection fraction, by estimation, is 60 to 65%. The  left ventricle has normal function. The left ventricle has no regional  wall motion abnormalities. There is mild left ventricular hypertrophy of  the basal-septal segment. Left  ventricular diastolic function could not be evaluated.   2. Right ventricular systolic function is normal. The right ventricular  size is normal. There is normal pulmonary artery systolic pressure. The  estimated right ventricular systolic pressure is 17.4 mmHg.   3. Left atrial size was mildly dilated.   4. The mitral valve is normal in structure. Trivial mitral valve  regurgitation. No evidence of mitral stenosis.   5. The aortic valve is normal in structure. Aortic valve regurgitation is  not visualized. No aortic stenosis is present.   6. Aortic dilatation noted. There is borderline dilatation of the aortic  root, measuring 37 mm.   7. The inferior vena cava is normal in size with greater than 50%  respiratory variability, suggesting right atrial pressure of 3 mmHg.     Neuro/Psych negative neurological ROS  negative psych ROS   GI/Hepatic negative GI ROS, Neg liver ROS,,,  Endo/Other  negative endocrine ROS    Renal/GU Renal disease     Musculoskeletal negative musculoskeletal ROS (+)    Abdominal   Peds  Hematology negative hematology ROS (+)   Anesthesia Other Findings   Reproductive/Obstetrics                              Anesthesia Physical Anesthesia Plan  ASA: 3  Anesthesia Plan: General   Post-op Pain Management: Minimal or no pain anticipated   Induction: Intravenous  PONV Risk Score and Plan: 0 and Propofol infusion  Airway Management Planned: Simple Face Mask and Natural Airway  Additional Equipment: None  Intra-op Plan:   Post-operative Plan:   Informed Consent: I have reviewed the patients History and Physical, chart, labs and discussed the procedure including the risks, benefits and alternatives for the proposed anesthesia with the patient or authorized representative who has indicated his/her understanding and acceptance.       Plan Discussed with: CRNA  Anesthesia Plan Comments:        Anesthesia Quick Evaluation

## 2022-02-17 NOTE — CV Procedure (Signed)
    CARDIOVERSION NOTE  Procedure: Electrical Cardioversion Indications:  Atrial Fibrillation  Procedure Details:  Consent: Risks of procedure as well as the alternatives and risks of each were explained to the (patient/caregiver).  Consent for procedure obtained.  Time Out: Verified patient identification, verified procedure, site/side was marked, verified correct patient position, special equipment/implants available, medications/allergies/relevent history reviewed, required imaging and test results available.  Performed  Patient placed on cardiac monitor, pulse oximetry, supplemental oxygen as necessary.  Sedation given:  propofol per anesthesia Pacer pads placed anterior and posterior chest.  Cardioverted 3 time(s).  Cardioverted at 200J biphasic.  Impression: Findings: Post procedure EKG shows: Atrial Fibrillation Complications: None Patient did tolerate procedure well.  Plan: Unsuccessful DCCV after 3 stacked shocks at 200J biphasic ( 2 with pressure and pad repositioning). No sinus beats were noted - minimal pause after shock. Consider starting AAD therapy per afib clinic.  Time Spent Directly with the Patient:  30 minutes   Pixie Casino, MD, [facility], [place] Director of the Advanced Lipid Disorders &  Cardiovascular Risk Reduction Clinic Diplomate of the American Board of Clinical Lipidology Attending Cardiologist  Direct Dial: [phone]  Fax: [phone]  Website:  [email or web address] 02/17/2022, 9:43 AM

## 2022-02-17 NOTE — Discharge Instructions (Signed)

## 2022-02-17 NOTE — Interval H&P Note (Signed)
History and Physical Interval Note:  02/17/2022 9:21 AM  Parker Saunders  has presented today for surgery, with the diagnosis of AFIB.  The various methods of treatment have been discussed with the patient and family. After consideration of risks, benefits and other options for treatment, the patient has consented to  Procedure(s): CARDIOVERSION (N/A) as a surgical intervention.  The patient's history has been reviewed, patient examined, no change in status, stable for surgery.  I have reviewed the patient's chart and labs.  Questions were answered to the patient's satisfaction.     Pixie Casino

## 2022-02-17 NOTE — Transfer of Care (Signed)
Immediate Anesthesia Transfer of Care Note  Patient: Parker Saunders  Procedure(s) Performed: CARDIOVERSION  Patient Location: Endoscopy Unit  Anesthesia Type:General  Level of Consciousness: awake, drowsy, and patient cooperative  Airway & Oxygen Therapy: Patient Spontanous Breathing  Post-op Assessment: Report given to RN, Post -op Vital signs reviewed and stable, and Patient moving all extremities X 4  Post vital signs: Reviewed and stable  Last Vitals:  Vitals Value Taken Time  BP    Temp    Pulse    Resp    SpO2      Last Pain:  Vitals:   02/17/22 0829  TempSrc: Temporal  PainSc: 0-No pain         Complications: No notable events documented.

## 2022-02-17 NOTE — Anesthesia Postprocedure Evaluation (Signed)
Anesthesia Post Note  Patient: Parker Saunders  Procedure(s) Performed: CARDIOVERSION     Patient location during evaluation: PACU Anesthesia Type: General Level of consciousness: awake and alert Pain management: pain level controlled Vital Signs Assessment: post-procedure vital signs reviewed and stable Respiratory status: spontaneous breathing, nonlabored ventilation, respiratory function stable and patient connected to nasal cannula oxygen Cardiovascular status: blood pressure returned to baseline and stable Postop Assessment: no apparent nausea or vomiting Anesthetic complications: no  No notable events documented.  Last Vitals:  Vitals:   02/17/22 0950 02/17/22 1000  BP: 118/85 114/86  Pulse: 94 79  Resp: (!) 23 12  Temp:    SpO2: 97% 95%    Last Pain:  Vitals:   02/17/22 1000  TempSrc:   PainSc: 0-No pain                 Effie Berkshire

## 2022-02-19 ENCOUNTER — Encounter (HOSPITAL_COMMUNITY): Payer: Self-pay | Admitting: Internal Medicine

## 2022-02-24 ENCOUNTER — Encounter (HOSPITAL_COMMUNITY): Payer: Self-pay | Admitting: Physician Assistant

## 2022-02-24 ENCOUNTER — Ambulatory Visit (HOSPITAL_COMMUNITY)
Admission: RE | Admit: 2022-02-24 | Discharge: 2022-02-24 | Disposition: A | Discharge status: Home / Self Care | Payer: Medicare HMO | Source: Ambulatory Visit | Attending: Physician Assistant | Admitting: Physician Assistant

## 2022-02-24 VITALS — BP 122/88 | HR 88 | Ht 75.0 in | Wt 289.8 lb

## 2022-02-24 DIAGNOSIS — I10 Essential (primary) hypertension: Secondary | ICD-10-CM | POA: Diagnosis not present

## 2022-02-24 DIAGNOSIS — Z7901 Long term (current) use of anticoagulants: Secondary | ICD-10-CM | POA: Insufficient documentation

## 2022-02-24 DIAGNOSIS — Z6836 Body mass index (BMI) 36.0-36.9, adult: Secondary | ICD-10-CM | POA: Diagnosis not present

## 2022-02-24 DIAGNOSIS — E669 Obesity, unspecified: Secondary | ICD-10-CM | POA: Diagnosis not present

## 2022-02-24 DIAGNOSIS — Z006 Encounter for examination for normal comparison and control in clinical research program: Secondary | ICD-10-CM

## 2022-02-24 DIAGNOSIS — E785 Hyperlipidemia, unspecified: Secondary | ICD-10-CM | POA: Diagnosis not present

## 2022-02-24 DIAGNOSIS — Z79899 Other long term (current) drug therapy: Secondary | ICD-10-CM | POA: Insufficient documentation

## 2022-02-24 DIAGNOSIS — I4819 Other persistent atrial fibrillation: Secondary | ICD-10-CM | POA: Diagnosis not present

## 2022-02-24 MED ORDER — FLECAINIDE ACETATE 100 MG PO TABS: 100.0000 mg | ORAL_TABLET | Freq: Two times a day (BID) | ORAL | 3 refills | Status: DC

## 2022-02-24 NOTE — Patient Instructions (Addendum)
Start flecainide 100mg  twice a day   Cardioversion scheduled for: Tuesday, February 6th   - Arrive at the [facility] and go to admitting at [facility] not eat or drink anything after midnight the night prior to your procedure.   - Take all your morning medication (except diabetic medications) with a sip of water prior to arrival.  - You will not be able to drive home after your procedure.    - Do NOT miss any doses of your blood thinner - if you should miss a dose please notify our office immediately.   - If you feel as if you go back into normal rhythm prior to scheduled cardioversion, please notify our office immediately.  If your procedure is canceled in the cardioversion suite you will be charged a cancellation fee.

## 2022-02-24 NOTE — Progress Notes (Signed)
Primary Care Physician: Shade Flood, MD Primary Cardiologist: Dr Tresa Endo Primary Electrophysiologist: none Referring Physician: Dr Tresa Endo   Parker Saunders is a 57 y.o. male with a history of HTN, HLD, atrial fibrillation who presents for follow up in the [facility].  The patient was initially diagnosed with atrial fibrillation 12/2021 at a routine physical with his PCP. An irregular heart beat was noted on auscultation and and ECG showed afib. He was referred to cardiology and seen by Dr Tresa Endo who started Eliquis for a CHADS2VASC score of 1 and metoprolol for rate control. Patient does admit to snoring. No significant alcohol use.   On follow up today, patient is s/p DCCV on 02/17/22 which was unsuccessful after 3 shocks. He remains unaware of his arrhythmia. No bleeding issues on anticoagulation.   Today, he denies symptoms of palpitations, chest pain, orthopnea, PND, lower extremity edema, dizziness, presyncope, syncope, snoring, daytime somnolence, bleeding, or neurologic sequela. The patient is tolerating medications without difficulties and is otherwise without complaint today.    Atrial Fibrillation Risk Factors:  he does have symptoms or diagnosis of sleep apnea. he is agreeable to sleep study.  he does not have a history of rheumatic fever. he does not have a history of alcohol use. The patient does not have a history of early familial atrial fibrillation or other arrhythmias.  he has a BMI of Body mass index is 36.22 kg/m.[name] Filed Weights   02/24/22 1524  Weight: 131.5 kg    Family History  Problem Relation Age of Onset   Hyperlipidemia Mother    Hypertension Mother    COPD Father    Hyperlipidemia Father    Hypertension Father      Atrial Fibrillation Management history:  Previous antiarrhythmic drugs: none Previous cardioversions: 02/17/22 Previous ablations: none CHADS2VASC score: 1 Anticoagulation history: Eliquis   Past  Medical History:  Diagnosis Date   Hypertension    Renal stone    Rotator cuff disorder    Past Surgical History:  Procedure Laterality Date   BACK SURGERY  2005   CARDIOVERSION N/A 02/17/2022   Procedure: CARDIOVERSION;  Surgeon: Chrystie Nose, MD;  Location: [facility] ENDOSCOPY;  Service: Cardiovascular;  Laterality: N/A;   LUMBAR LAMINECTOMY/DECOMPRESSION MICRODISCECTOMY Left 02/04/2016   Procedure: REDO MICRO LUMBAR DECOMPRESSION L4-5, L5-S1 ON THE LEFT  2 LEVELS;  Surgeon: [name], MD;  Location: [facility] ORS;  Service: Orthopedics;  Laterality: Left;   SHOULDER SURGERY Right 2014   SPINE SURGERY  02/07/2005   Tomasita Crumble    Current Outpatient Medications  Medication Sig Dispense Refill   acetaminophen (TYLENOL) 500 MG tablet Take 500 mg by mouth every 6 (six) hours as needed for moderate pain or mild pain.     apixaban (ELIQUIS) 5 MG TABS tablet Take 1 tablet (5 mg total) by mouth 2 (two) times daily. 60 tablet 3   atorvastatin (LIPITOR) 20 MG tablet Take 1 tablet (20 mg total) by mouth daily. 90 tablet 2   losartan-hydrochlorothiazide (HYZAAR) 100-12.5 MG tablet TAKE 1 TABLET BY MOUTH DAILY 90 tablet 2   metoprolol succinate (TOPROL-XL) 50 MG 24 hr tablet Take 1 tablet (50 mg total) by mouth daily. Take with or immediately following a meal. 90 tablet 3   No current facility-administered medications for this encounter.    No Known Allergies  Social History   Socioeconomic History   Marital status: Married    Spouse name: Not on file   Number of children:  2   Years of education: Not on file   Highest education level: Not on file  Occupational History   Occupation: unemployed    Comment: since 2015  Tobacco Use   Smoking status: Never   Smokeless tobacco: Never   Tobacco comments:    Never smoke 02/24/22  Substance and Sexual Activity   Alcohol use: No   Drug use: No   Sexual activity: Yes  Other Topics Concern   Not on file  Social History Narrative   Marital  status: married      Children: 2 daughters      Lives: with wife, 2 daughters      Employment: unemployed after termination in 2015     Tobacco: none      Alcohol: none      Exercise:  Walking 4-5 miles daily   Social Determinants of Health   Financial Resource Strain: Low Risk  (07/15/2021)   Overall Financial Resource Strain (CARDIA)    Difficulty of Paying Living Expenses: Not hard at all  Food Insecurity: No Food Insecurity (07/15/2021)   Hunger Vital Sign    Worried About Running Out of Food in the Last Year: Never true    Ran Out of Food in the Last Year: Never true  Transportation Needs: No Transportation Needs (07/15/2021)   PRAPARE - Hydrologist (Medical): No    Lack of Transportation (Non-Medical): No  Physical Activity: Insufficiently Active (07/15/2021)   Exercise Vital Sign    Days of Exercise per Week: 3 days    Minutes of Exercise per Session: 30 min  Stress: No Stress Concern Present (07/15/2021)   [place]    Feeling of Stress : Not at all  Social Connections: Moderately Integrated (07/15/2021)   Social Connection and Isolation Panel [NHANES]    Frequency of Communication with Friends and Family: Three times a week    Frequency of Social Gatherings with Friends and Family: Three times a week    Attends Religious Services: More than 4 times per year    Active Member of Clubs or Organizations: No    Attends Archivist Meetings: Never    Marital Status: Married  Human resources officer Violence: Not At Risk (07/15/2021)   Humiliation, Afraid, Rape, and Kick questionnaire    Fear of Current or Ex-Partner: No    Emotionally Abused: No    Physically Abused: No    Sexually Abused: No     ROS- All systems are reviewed and negative except as per the HPI above.  Physical Exam: Vitals:   02/24/22 1524  BP: 122/88  Pulse: 88  Weight: 131.5 kg  Height: 6\' 3"  (1.905 m)      GEN- The patient is a well appearing obese male, alert and oriented x 3 today.   HEENT-head normocephalic, atraumatic, sclera clear, conjunctiva pink, hearing intact, trachea midline. Lungs- Clear to ausculation bilaterally, normal work of breathing Heart- irregular rate and rhythm, no murmurs, rubs or gallops  GI- soft, NT, ND, + BS Extremities- no clubbing, cyanosis, or edema MS- no significant deformity or atrophy Skin- no rash or lesion Psych- euthymic mood, full affect Neuro- strength and sensation are intact   Wt Readings from Last 3 Encounters:  02/24/22 131.5 kg  02/17/22 131.5 kg  01/25/22 132 kg    EKG today demonstrates  Afib Vent. rate 88 BPM PR interval * ms QRS duration 88 ms QT/QTcB 342/413 ms  Echo 01/21/22 demonstrated   1. Left ventricular ejection fraction, by estimation, is 60 to 65%. The  left ventricle has normal function. The left ventricle has no regional  wall motion abnormalities. There is mild left ventricular hypertrophy of  the basal-septal segment. Left ventricular diastolic function could not be evaluated.   2. Right ventricular systolic function is normal. The right ventricular  size is normal. There is normal pulmonary artery systolic pressure. The  estimated right ventricular systolic pressure is 33.8 mmHg.   3. Left atrial size was mildly dilated.   4. The mitral valve is normal in structure. Trivial mitral valve  regurgitation. No evidence of mitral stenosis.   5. The aortic valve is normal in structure. Aortic valve regurgitation is  not visualized. No aortic stenosis is present.   6. Aortic dilatation noted. There is borderline dilatation of the aortic  root, measuring 37 mm.   7. The inferior vena cava is normal in size with greater than 50%  respiratory variability, suggesting right atrial pressure of 3 mmHg.   Epic records are reviewed at length today  CHA2DS2-VASc Score = 1  The patient's score is based upon: CHF  History: 0 HTN History: 1 Diabetes History: 0 Stroke History: 0 Vascular Disease History: 0 Age Score: 0 Gender Score: 0       ASSESSMENT AND PLAN: 1. Persistent Atrial Fibrillation (ICD10:  I48.19) The patient's CHA2DS2-VASc score is 1, indicating a 0.6% annual risk of stroke.   S/p unsuccessful DCCV on 02/17/22 We discussed rhythm control options today including AAD and ablation. Will start flecainide 100 mg BID and plan for repeat DCCV if he does not convert. Will need TST once in SR. He would be willing to consider ablation if he fails flecainide.  Continue Eliquis 5 mg BID Continue Toprol 50 mg daily   2. HTN Stable, no changes today.  3. Obesity Body mass index is 36.22 kg/m. Lifestyle modification was discussed and encouraged including regular physical activity and weight reduction.  4. Suspected Obstructive sleep apnea Sleep study per Dr Claiborne Billings.    Follow up in the AF clinic next week for ECG.    [facility] [address] [place], [place] [address] [phone] 02/24/2022 3:39 PM

## 2022-02-24 NOTE — Research (Signed)
[facility] Informed Consent   Subject Name: Parker Saunders  Subject met inclusion and exclusion criteria.  The informed consent form, study requirements and expectations were reviewed with the subject and questions and concerns were addressed prior to the signing of the consent form.  The subject verbalized understanding of the trial requirements.  The subject agreed to participate in the [facility] trial and signed the informed consent at on 18/Jan/2024.  The informed consent was obtained prior to performance of any protocol-specific procedures for the subject.  A copy of the signed informed consent was given to the subject and a copy was placed in the subject's medical record.   Tori Milks Sathvika Ojo

## 2022-03-03 ENCOUNTER — Ambulatory Visit (HOSPITAL_COMMUNITY)
Admission: RE | Admit: 2022-03-03 | Discharge: 2022-03-03 | Disposition: A | Discharge status: Home / Self Care | Payer: Medicare HMO | Source: Ambulatory Visit | Attending: Physician Assistant | Admitting: Physician Assistant

## 2022-03-03 DIAGNOSIS — I4819 Other persistent atrial fibrillation: Secondary | ICD-10-CM | POA: Diagnosis not present

## 2022-03-03 LAB — CBC
HCT: 41.9 % (ref 39.0–52.0)
Hemoglobin: 14.5 g/dL (ref 13.0–17.0)
MCH: 29.5 pg (ref 26.0–34.0)
MCHC: 34.6 g/dL (ref 30.0–36.0)
MCV: 85.3 fL (ref 80.0–100.0)
Platelets: 297 10*3/uL (ref 150–400)
RBC: 4.91 MIL/uL (ref 4.22–5.81)
RDW: 12.7 % (ref 11.5–15.5)
WBC: 8.7 10*3/uL (ref 4.0–10.5)
nRBC: 0 % (ref 0.0–0.2)

## 2022-03-03 LAB — BASIC METABOLIC PANEL
Anion gap: 8 (ref 5–15)
BUN: 16 mg/dL (ref 6–20)
CO2: 26 mmol/L (ref 22–32)
Calcium: 9.1 mg/dL (ref 8.9–10.3)
Chloride: 104 mmol/L (ref 98–111)
Creatinine, Ser: 1.2 mg/dL (ref 0.61–1.24)
GFR, Estimated: >60 mL/min (ref >60)
Glucose, Bld: 96 mg/dL (ref 70–99)
Potassium: 4.1 mmol/L (ref 3.5–5.1)
Sodium: 138 mmol/L (ref 135–145)

## 2022-03-03 NOTE — H&P (View-Only) (Signed)
Patient returns for ECG and lab work prior to [place]. ECG shows:  Afib Vent. rate 71 BPM PR interval * ms QRS duration 98 ms QT/QTcB 382/415 ms  Continue flecainide 100 mg BID. Scheduled for DCCV on 03/15/22. Follow up in the AF clinic post DCCV.

## 2022-03-03 NOTE — Progress Notes (Signed)
Patient returns for ECG and lab work prior to DCCV. ECG shows:  Afib Vent. rate 71 BPM PR interval * ms QRS duration 98 ms QT/QTcB 382/415 ms  Continue flecainide 100 mg BID. Scheduled for DCCV on 03/15/22. Follow up in the AF clinic post DCCV.  

## 2022-03-15 ENCOUNTER — Encounter (HOSPITAL_COMMUNITY): Payer: Self-pay | Admitting: Anesthesiology

## 2022-03-15 ENCOUNTER — Encounter (HOSPITAL_COMMUNITY)
Admission: RE | Disposition: A | Discharge status: Home / Self Care | Payer: Self-pay | Source: Home / Self Care | Attending: Cardiovascular Disease

## 2022-03-15 ENCOUNTER — Ambulatory Visit (HOSPITAL_COMMUNITY)
Admission: RE | Admit: 2022-03-15 | Discharge: 2022-03-15 | Disposition: A | Discharge status: Home / Self Care | Payer: Medicare HMO | Attending: Cardiovascular Disease | Admitting: Cardiovascular Disease

## 2022-03-15 ENCOUNTER — Encounter (HOSPITAL_COMMUNITY): Payer: Self-pay | Admitting: Cardiovascular Disease

## 2022-03-15 DIAGNOSIS — I4891 Unspecified atrial fibrillation: Secondary | ICD-10-CM | POA: Insufficient documentation

## 2022-03-15 DIAGNOSIS — Z538 Procedure and treatment not carried out for other reasons: Secondary | ICD-10-CM | POA: Insufficient documentation

## 2022-03-15 SURGERY — CANCELLED PROCEDURE

## 2022-03-15 NOTE — Interval H&P Note (Signed)
History and Physical Interval Note:  03/15/2022 7:31 AM  Parker Saunders  has presented today for surgery, with the diagnosis of AFIB.  The various methods of treatment have been discussed with the patient and family. After consideration of risks, benefits and other options for treatment, the patient has consented to  Procedure(s): CARDIOVERSION (N/A) as a surgical intervention.  The patient's history has been reviewed, patient examined, no change in status, stable for surgery.  I have reviewed the patient's chart and labs.  Questions were answered to the patient's satisfaction.    Procedure canceled. In NSR.   [place] T. Audie Box, MD, [place]  [address], [place], [place] [address] [phone]  7:31 AM

## 2022-03-15 NOTE — Progress Notes (Signed)
Pt arrived to endo for scheduled DCCV with Dr. Marisue Ivan. Pt in NSR, confirmed by EKG. DCCV canceled. Dr Marisue Ivan at bedside discussing plan of care with pt. Pt discharged home.

## 2022-03-28 ENCOUNTER — Ambulatory Visit (HOSPITAL_COMMUNITY)
Admission: RE | Admit: 2022-03-28 | Discharge: 2022-03-28 | Disposition: A | Discharge status: Home / Self Care | Payer: Medicare HMO | Source: Ambulatory Visit | Attending: Physician Assistant | Admitting: Physician Assistant

## 2022-03-28 VITALS — BP 136/84 | HR 84 | Ht 75.0 in | Wt 290.6 lb

## 2022-03-28 DIAGNOSIS — I4819 Other persistent atrial fibrillation: Secondary | ICD-10-CM | POA: Diagnosis not present

## 2022-03-28 DIAGNOSIS — R0683 Snoring: Secondary | ICD-10-CM | POA: Insufficient documentation

## 2022-03-28 DIAGNOSIS — E785 Hyperlipidemia, unspecified: Secondary | ICD-10-CM | POA: Insufficient documentation

## 2022-03-28 DIAGNOSIS — Z7901 Long term (current) use of anticoagulants: Secondary | ICD-10-CM | POA: Diagnosis not present

## 2022-03-28 DIAGNOSIS — Z79899 Other long term (current) drug therapy: Secondary | ICD-10-CM | POA: Diagnosis not present

## 2022-03-28 DIAGNOSIS — Z6836 Body mass index (BMI) 36.0-36.9, adult: Secondary | ICD-10-CM | POA: Diagnosis not present

## 2022-03-28 DIAGNOSIS — I10 Essential (primary) hypertension: Secondary | ICD-10-CM | POA: Diagnosis not present

## 2022-03-28 DIAGNOSIS — E669 Obesity, unspecified: Secondary | ICD-10-CM | POA: Insufficient documentation

## 2022-03-28 NOTE — Progress Notes (Signed)
Primary Care Physician: Wendie Agreste, MD Primary Cardiologist: Dr Claiborne Billings Primary Electrophysiologist: none Referring Physician: Dr Claiborne Billings   Parker Saunders is a 57 y.o. male with a history of HTN, HLD, atrial fibrillation who presents for follow up in the [facility].  The patient was initially diagnosed with atrial fibrillation 12/2021 at a routine physical with his PCP. An irregular heart beat was noted on auscultation and and ECG showed afib. He was referred to cardiology and seen by Dr Claiborne Billings who started Eliquis for a CHADS2VASC score of 1 and metoprolol for rate control. Patient does admit to snoring. No significant alcohol use.   Patient is s/p DCCV on 02/17/22 which was unsuccessful after 3 shocks. He was started on flecainide and chemically converted to SR, DCCV on 03/15/22 was cancelled.   On follow up today, patient reports that he feels that he has more energy despite being back in rate controlled afib. Suspect he is paroxysmal at this point. No bleeding issues on anticoagulation.   Today, he denies symptoms of palpitations, chest pain, orthopnea, PND, lower extremity edema, dizziness, presyncope, syncope, snoring, daytime somnolence, bleeding, or neurologic sequela. The patient is tolerating medications without difficulties and is otherwise without complaint today.    Atrial Fibrillation Risk Factors:  he does have symptoms or diagnosis of sleep apnea. he is agreeable to sleep study.  he does not have a history of rheumatic fever. he does not have a history of alcohol use. The patient does not have a history of early familial atrial fibrillation or other arrhythmias.  he has a BMI of Body mass index is 36.32 kg/m.[name] Filed Weights   03/28/22 1515  Weight: 131.8 kg   Family History  Problem Relation Age of Onset   Hyperlipidemia Mother    Hypertension Mother    COPD Father    Hyperlipidemia Father    Hypertension Father      Atrial  Fibrillation Management history:  Previous antiarrhythmic drugs: flecainide  Previous cardioversions: 02/17/22 Previous ablations: none CHADS2VASC score: 1 Anticoagulation history: Eliquis   Past Medical History:  Diagnosis Date   Hypertension    Renal stone    Rotator cuff disorder    Past Surgical History:  Procedure Laterality Date   BACK SURGERY  2005   CARDIOVERSION N/A 02/17/2022   Procedure: CARDIOVERSION;  Surgeon: Pixie Casino, MD;  Location: [facility];  Service: Cardiovascular;  Laterality: N/A;   LUMBAR LAMINECTOMY/DECOMPRESSION MICRODISCECTOMY Left 02/04/2016   Procedure: REDO MICRO LUMBAR DECOMPRESSION L4-5, L5-S1 ON THE LEFT  2 LEVELS;  Surgeon: Susa Day, MD;  Location: [facility];  Service: Orthopedics;  Laterality: Left;   SHOULDER SURGERY Right 2014   SPINE SURGERY  02/07/2005   Antionette Char    Current Outpatient Medications  Medication Sig Dispense Refill   acetaminophen (TYLENOL) 500 MG tablet Take 500 mg by mouth every 6 (six) hours as needed for moderate pain or mild pain.     apixaban (ELIQUIS) 5 MG TABS tablet Take 1 tablet (5 mg total) by mouth 2 (two) times daily. 60 tablet 3   atorvastatin (LIPITOR) 20 MG tablet Take 1 tablet (20 mg total) by mouth daily. 90 tablet 2   flecainide (TAMBOCOR) 100 MG tablet Take 1 tablet (100 mg total) by mouth 2 (two) times daily. 60 tablet 3   losartan-hydrochlorothiazide (HYZAAR) 100-12.5 MG tablet TAKE 1 TABLET BY MOUTH DAILY 90 tablet 2   metoprolol succinate (TOPROL-XL) 50 MG 24 hr tablet Take 1  tablet (50 mg total) by mouth daily. Take with or immediately following a meal. 90 tablet 3   No current facility-administered medications for this encounter.    No Known Allergies  Social History   Socioeconomic History   Marital status: Married    Spouse name: Not on file   Number of children: 2   Years of education: Not on file   Highest education level: Not on file  Occupational History   Occupation:  unemployed    Comment: since 2015  Tobacco Use   Smoking status: Never   Smokeless tobacco: Never   Tobacco comments:    Never smoke 02/24/22  Substance and Sexual Activity   Alcohol use: No   Drug use: No   Sexual activity: Yes  Other Topics Concern   Not on file  Social History Narrative   Marital status: married      Children: 2 daughters      Lives: with wife, 2 daughters      Employment: unemployed after termination in 2015     Tobacco: none      Alcohol: none      Exercise:  Walking 4-5 miles daily   Social Determinants of Health   Financial Resource Strain: Low Risk  (07/15/2021)   Overall Financial Resource Strain (CARDIA)    Difficulty of Paying Living Expenses: Not hard at all  Food Insecurity: No Food Insecurity (07/15/2021)   Hunger Vital Sign    Worried About Running Out of Food in the Last Year: Never true    [place] in the Last Year: Never true  Transportation Needs: No Transportation Needs (07/15/2021)   PRAPARE - Hydrologist (Medical): No    Lack of Transportation (Non-Medical): No  Physical Activity: Insufficiently Active (07/15/2021)   Exercise Vital Sign    Days of Exercise per Week: 3 days    Minutes of Exercise per Session: 30 min  Stress: No Stress Concern Present (07/15/2021)   [place]    Feeling of Stress : Not at all  Social Connections: Moderately Integrated (07/15/2021)   Social Connection and Isolation Panel [NHANES]    Frequency of Communication with Friends and Family: Three times a week    Frequency of Social Gatherings with Friends and Family: Three times a week    Attends Religious Services: More than 4 times per year    Active Member of Clubs or Organizations: No    Attends Archivist Meetings: Never    Marital Status: Married  Human resources officer Violence: Not At Risk (07/15/2021)   Humiliation, Afraid, Rape, and Kick questionnaire     Fear of Current or Ex-Partner: No    Emotionally Abused: No    Physically Abused: No    Sexually Abused: No     ROS- All systems are reviewed and negative except as per the HPI above.  Physical Exam: Vitals:   03/28/22 1515  BP: 136/84  Pulse: 84  Weight: 131.8 kg  Height: 6' 3"$  (1.905 m)    GEN- The patient is a well appearing obese male, alert and oriented x 3 today.   HEENT-head normocephalic, atraumatic, sclera clear, conjunctiva pink, hearing intact, trachea midline. Lungs- Clear to ausculation bilaterally, normal work of breathing Heart- irregular rate and rhythm, no murmurs, rubs or gallops  GI- soft, NT, ND, + BS Extremities- no clubbing, cyanosis, or edema MS- no significant deformity or atrophy Skin- no rash  or lesion Psych- euthymic mood, full affect Neuro- strength and sensation are intact   Wt Readings from Last 3 Encounters:  03/28/22 131.8 kg  03/15/22 130.2 kg  02/24/22 131.5 kg    EKG today demonstrates  Afib Vent. rate 84 BPM PR interval * ms QRS duration 100 ms QT/QTcB 354/418 ms  Echo 01/21/22 demonstrated   1. Left ventricular ejection fraction, by estimation, is 60 to 65%. The  left ventricle has normal function. The left ventricle has no regional  wall motion abnormalities. There is mild left ventricular hypertrophy of  the basal-septal segment. Left ventricular diastolic function could not be evaluated.   2. Right ventricular systolic function is normal. The right ventricular  size is normal. There is normal pulmonary artery systolic pressure. The  estimated right ventricular systolic pressure is [id number] mmHg.   3. Left atrial size was mildly dilated.   4. The mitral valve is normal in structure. Trivial mitral valve  regurgitation. No evidence of mitral stenosis.   5. The aortic valve is normal in structure. Aortic valve regurgitation is  not visualized. No aortic stenosis is present.   6. Aortic dilatation noted. There is borderline  dilatation of the aortic  root, measuring 37 mm.   7. The inferior vena cava is normal in size with greater than 50%  respiratory variability, suggesting right atrial pressure of 3 mmHg.   Epic records are reviewed at length today  CHA2DS2-VASc Score = 1  The patient's score is based upon: CHF History: 0 HTN History: 1 Diabetes History: 0 Stroke History: 0 Vascular Disease History: 0 Age Score: 0 Gender Score: 0       ASSESSMENT AND PLAN: 1. Persistent Atrial Fibrillation (ICD10:  I48.19) The patient's CHA2DS2-VASc score is 1, indicating a 0.6% annual risk of stroke.   S/p unsuccessful DCCV on 02/17/22, chemically converted on flecainide but in afib today.  We discussed rhythm control options today including increasing flecainide dose vs ablation. He is agreeable to consultation with EP to discuss ablation.   Continue flecainide 100 mg BID  He will need TST once back in SR if he stays on flecainide.  Continue Eliquis 5 mg BID Continue Toprol 50 mg daily   2. HTN Stable, no changes today.  3. Obesity Body mass index is 36.32 kg/m. Lifestyle modification was discussed and encouraged including regular physical activity and weight reduction. He is going to the gym more regularly.   4. Suspected Obstructive sleep apnea Sleep study per Dr Claiborne Billings.    Follow up with EP to discuss afib ablation.    [facility] [address] [place], [place] [address] [phone] 03/28/2022 3:45 PM

## 2022-03-31 ENCOUNTER — Other Ambulatory Visit: Payer: Self-pay | Admitting: Cardiovascular Disease

## 2022-03-31 DIAGNOSIS — I4819 Other persistent atrial fibrillation: Secondary | ICD-10-CM

## 2022-03-31 NOTE — Telephone Encounter (Signed)
Prescription refill request for Eliquis received. Indication: Afib  Last office visit: 03/28/22 Parker Saunders)  Scr: 1.20 (03/03/22)  Age: 57 Weight: 131.8kg  Appropriate dose. Refill sent.

## 2022-05-09 ENCOUNTER — Encounter: Payer: Self-pay | Admitting: Cardiovascular Disease

## 2022-05-09 ENCOUNTER — Other Ambulatory Visit: Payer: Self-pay

## 2022-05-09 ENCOUNTER — Other Ambulatory Visit: Payer: Medicare HMO

## 2022-05-09 ENCOUNTER — Telehealth: Payer: Self-pay

## 2022-05-09 ENCOUNTER — Ambulatory Visit: Payer: Medicare HMO | Attending: Cardiovascular Disease | Admitting: Cardiovascular Disease

## 2022-05-09 VITALS — BP 130/86 | HR 68 | Ht 75.0 in | Wt 292.0 lb

## 2022-05-09 DIAGNOSIS — I4819 Other persistent atrial fibrillation: Secondary | ICD-10-CM

## 2022-05-09 NOTE — Telephone Encounter (Signed)
Patient was given an [facility] sleeps study in office today, ordered by Dr. Myles Gip. Patient is aware not to open until insurance approves and we call with a pin#.

## 2022-05-09 NOTE — Progress Notes (Signed)
Electrophysiology Office Note:    Date:  05/09/2022   ID:  Parker Saunders, DOB 1965-08-17, MRN [id number]  PCP:  Wendie Agreste, MD   [place] Providers Cardiologist:  None Electrophysiologist:  Melida Quitter, MD     Referring MD: Oliver Barre, PA   History of Present Illness:    Parker Saunders is a 57 y.o. male with a hx listed below, significant for hypertension, hyperlipidemia, atrial fibrillation, referred for arrhythmia management.  He was initially diagnosed with atrial fibrillation in November 2023 during routine physical by his PCP.  He was started on Eliquis.  His Chads2Vasc score is 1.  DC cardioversion was attempted unsuccessfully on July 19, 2022.  Flecainide was started, and he had to sinus rhythm prior to follow-up on February 6.  He feels better in sinus rhythm, has more energy.  However, on his more recent follow-up in A-fib clinic on February 19, he had converted back to atrial fibrillation. Today, his ECG shows that he is back in sinus rhythm.    Past Medical History:  Diagnosis Date   Hypertension    Renal stone    Rotator cuff disorder     Past Surgical History:  Procedure Laterality Date   BACK SURGERY  2005   CARDIOVERSION N/A 02/17/2022   Procedure: CARDIOVERSION;  Surgeon: Pixie Casino, MD;  Location: [facility] ENDOSCOPY;  Service: Cardiovascular;  Laterality: N/A;   LUMBAR LAMINECTOMY/DECOMPRESSION MICRODISCECTOMY Left 02/04/2016   Procedure: REDO MICRO LUMBAR DECOMPRESSION L4-5, L5-S1 ON THE LEFT  2 LEVELS;  Surgeon: Susa Day, MD;  Location: [facility] ORS;  Service: Orthopedics;  Laterality: Left;   SHOULDER SURGERY Right 2014   SPINE SURGERY  02/07/2005   [facility]    Current Medications: Current Meds  Medication Sig   acetaminophen (TYLENOL) 500 MG tablet Take 500 mg by mouth every 6 (six) hours as needed for moderate pain or mild pain.   apixaban (ELIQUIS) 5 MG TABS tablet TAKE 1 TABLET(5 MG) BY MOUTH TWICE DAILY    atorvastatin (LIPITOR) 20 MG tablet Take 1 tablet (20 mg total) by mouth daily.   flecainide (TAMBOCOR) 100 MG tablet Take 1 tablet (100 mg total) by mouth 2 (two) times daily.   losartan-hydrochlorothiazide (HYZAAR) 100-12.5 MG tablet TAKE 1 TABLET BY MOUTH DAILY   metoprolol succinate (TOPROL-XL) 50 MG 24 hr tablet Take 1 tablet (50 mg total) by mouth daily. Take with or immediately following a meal.     Allergies:   Patient has no known allergies.   Social and Family History: Reviewed in Epic  ROS:   Please see the history of present illness.    All other systems reviewed and are negative.  EKGs/Labs/Other Studies Reviewed Today:    Echocardiogram:  TTE 01/21/22 EF 60 to 65%, mild basal septal LVH.  LA was mildly dilated.   Monitors:   Stress testing:    Advanced imaging:   EKG:  Last EKG results: May a.m.-normal sinus rhythm, HR 61 bpm   Recent Labs: 12/14/2021: ALT 34; NT-Pro BNP 969; TSH 1.95 03/03/2022: BUN 16; Creatinine, Ser 1.20; Hemoglobin 14.5; Platelets 297; Potassium 4.1; Sodium 138     Physical Exam:    VS:  BP 130/86   Pulse 68   Ht 6\' 3"  (1.905 m)   Wt 292 lb (132.5 kg)   SpO2 97%   BMI 36.50 kg/m     Wt Readings from Last 3 Encounters:  05/09/22 292 lb (132.5 kg)  03/28/22 290 lb  9.6 oz (131.8 kg)  03/15/22 287 lb (130.2 kg)     GEN: Well nourished, well developed in no acute distress CARDIAC: RRR, no murmurs, rubs, gallops RESPIRATORY:  Normal work of breathing MUSCULOSKELETAL: no edema    ASSESSMENT & PLAN:    Persistent atrial fibrillation Having AF recurrence on flecainide. He symptomatic with fatigue We discussed the indication, rationale, logistics, anticipated benefits, and potential risks of the ablation procedure including but not limited to -- bleed at the groin access site, chest pain, damage to nearby organs such as the diaphragm, lungs, or esophagus, need for a drainage tube, or prolonged hospitalization. I explained  that the risk for stroke, heart attack, need for open chest surgery, or even death is very low but not zero. he  expressed understanding and wishes to proceed.   Secondary hypercoagulable state CHADS2 vasc is 1 Continue apixaban, may consider switching to aspirin 3 months after ablation  Hypertension Continue losartan-HCTZ 100-12 0.5, metoprolol XL 50 mg        Medication Adjustments/Labs and Tests Ordered: Current medicines are reviewed at length with the patient today.  Concerns regarding medicines are outlined above.  No orders of the defined types were placed in this encounter.  No orders of the defined types were placed in this encounter.    Signed, Melida Quitter, MD  05/09/2022 4:17 PM    [place]

## 2022-05-09 NOTE — Patient Instructions (Signed)
Medication Instructions:  Your physician recommends that you continue on your current medications as directed. Please refer to the Current Medication list given to you today. *If you need a refill on your cardiac medications before your next appointment, please call your pharmacy*   Lab Work: CBC and BMET on Monday, July 15th  If you have labs (blood work) drawn today and your tests are completely normal, you will receive your results only by: [place] (if you have MyChart) OR A paper copy in the mail If you have any lab test that is abnormal or we need to change your treatment, we will call you to review the results.   Testing/Procedures: Afib Ablation Your physician has recommended that you have an ablation. Catheter ablation is a medical procedure used to treat some cardiac arrhythmias (irregular heartbeats). During catheter ablation, a long, thin, flexible tube is put into a blood vessel in your groin (upper thigh), or neck. This tube is called an ablation catheter. It is then guided to your heart through the blood vessel. Radio frequency waves destroy small areas of heart tissue where abnormal heartbeats may cause an arrhythmia to start. Please see the instruction sheet given to you today.   [place] Sleep Study Your physician has recommended that you have a sleep study. This test records several body functions during sleep, including: brain activity, eye movement, oxygen and carbon dioxide blood levels, heart rate and rhythm, breathing rate and rhythm, the flow of air through your mouth and nose, snoring, body muscle movements, and chest and belly movement.    Follow-Up: At [facility], you and your health needs are our priority.  As part of our continuing mission to provide you with exceptional heart care, we have created designated Provider Care Teams.  These Care Teams include your primary Cardiologist (physician) and Advanced Practice Providers (APPs -  Physician  Assistants and Nurse Practitioners) who all work together to provide you with the care you need, when you need it.  We recommend signing up for the patient portal called "MyChart".  Sign up information is provided on this After Visit Summary.  MyChart is used to connect with patients for Virtual Visits (Telemedicine).  Patients are able to view lab/test results, encounter notes, upcoming appointments, etc.  Non-urgent messages can be sent to your provider as well.   To learn more about what you can do with MyChart, go to [email or web address].    Your next appointment:   We will contact you for follow up  Provider:   Doralee Albino, MD   Other Instructions Cardiac Ablation Cardiac ablation is a procedure to destroy, or ablate, a small amount of heart tissue that is causing problems. The heart has many electrical connections. Sometimes, these connections are abnormal and can cause the heart to beat very fast or irregularly. Ablating the abnormal areas can improve the heart's rhythm or return it to normal. Ablation may be done for people who: Have irregular or rapid heartbeats (arrhythmias). Have Wolff-Parkinson-White syndrome. Have taken medicines for an arrhythmia that did not work or caused side effects. Have a high-risk heartbeat that may be life-threatening. Tell a health care provider about: Any allergies you have. All medicines you are taking, including vitamins, herbs, eye drops, creams, and over-the-counter medicines. Any problems you or family members have had with anesthesia. Any bleeding problems you have. Any surgeries you have had. Any medical conditions you have. Whether you are pregnant or may be pregnant. What are the risks? Your health care  provider will talk with you about risks. These may include: Infection. Bruising and bleeding. Stroke or blood clots. Damage to nearby structures or organs. Allergic reaction to medicines or dyes. Needing a pacemaker if the heart  gets damaged. A pacemaker is a device that helps the heart beat normally. Failure of the procedure. A repeat procedure may be needed. What happens before the procedure? Medicines Ask your health care provider about: Changing or stopping your regular medicines. These include any heart rhythm medicines, diabetes medicines, or blood thinners you take. Taking medicines such as aspirin and ibuprofen. These medicines can thin your blood. Do not take them unless your health care provider tells you to. Taking over-the-counter medicines, vitamins, herbs, and supplements. General instructions Follow instructions from your health care provider about what you may eat and drink. If you will be going home right after the procedure, plan to have a responsible adult: Take you home from the hospital or clinic. You will not be allowed to drive. Care for you for the time you are told. Ask your health care provider what steps will be taken to prevent infection. What happens during the procedure?  An IV will be inserted into one of your veins. You may be given: A sedative. This helps you relax. Anesthesia. This will: Numb certain areas of your body. An incision will be made in your neck or your groin. A needle will be inserted through the incision and into a large vein in your neck or groin. The small, thin tube (catheter) will be inserted through the needle and moved to your heart. A type of X-ray (fluoroscopy) will be used to help guide the catheter and provide images of the heart on a monitor. Dye may be injected through the catheter to help your surgeon see the area of the heart that needs treatment. Electrical currents will be sent from the catheter to destroy heart tissue in certain areas. There are three types of energy that may be used to do this: Heat (radiofrequency energy). Laser energy. Extreme cold (cryoablation). When the tissue has been destroyed, the catheter will be removed. Pressure will  be held on the insertion area to prevent bleeding. A bandage (dressing) will be placed over the insertion area. The procedure may vary among health care providers and hospitals. What happens after the procedure? Your blood pressure, heart rate and rhythm, breathing rate, and blood oxygen level will be monitored until you leave the hospital or clinic. Your insertion area will be checked for bleeding. You will need to lie still for a few hours. If your groin was used, you will need to keep your leg straight for a few hours after the catheter is removed. This information is not intended to replace advice given to you by your health care provider. Make sure you discuss any questions you have with your health care provider. Document Revised: 07/13/2021 Document Reviewed: 07/13/2021 Elsevier Patient Education  [place].

## 2022-05-16 NOTE — Telephone Encounter (Signed)
Left message for the pt that he has been approved to proceed with sleep study. Left message if study could be done one night this week. Please call back and confirm vm was received. Left PIN# [id number] on vm for the pt.

## 2022-05-16 NOTE — Telephone Encounter (Signed)
Prior Authorization for [facility] sent to [facility] via web portal. Tracking Number [id number] REQ

## 2022-05-17 ENCOUNTER — Encounter (INDEPENDENT_AMBULATORY_CARE_PROVIDER_SITE_OTHER): Payer: Medicare HMO | Admitting: Cardiology

## 2022-05-17 DIAGNOSIS — G4733 Obstructive sleep apnea (adult) (pediatric): Secondary | ICD-10-CM

## 2022-05-17 NOTE — Telephone Encounter (Signed)
Pt called me back and confirmed that he did get my vm and he will proceed with sleep study tonight.   Called and made the patient aware that he may proceed with the [facility] Sleep Study. PIN # provided to the patient. Patient made aware that he will be contacted after the test has been read with the results and any recommendations. Patient verbalized understanding and thanked me for the call.

## 2022-05-18 ENCOUNTER — Ambulatory Visit: Payer: Medicare HMO | Attending: Cardiovascular Disease

## 2022-05-18 DIAGNOSIS — I4819 Other persistent atrial fibrillation: Secondary | ICD-10-CM

## 2022-05-18 NOTE — Procedures (Signed)
SLEEP STUDY REPORT Patient Information Study Date: 05/17/2022 Patient Name: Parker Saunders Patient ID: [id number] Birth Date: [birth year] Age: 57 Gender: Male BMI: 36.8 (W=293 lb, H=6' 3'') Referring Physician: York Pellant, MD  TEST DESCRIPTION: Home sleep apnea testing was completed using the WatchPat, a Type 1 device, utilizing peripheral arterial tonometry (PAT), chest movement, actigraphy, pulse oximetry, pulse rate, body position and snore. AHI was calculated with apnea and hypopnea using valid sleep time as the denominator. RDI includes apneas, hypopneas, and RERAs. The data acquired and the scoring of sleep and all associated events were performed in accordance with the recommended standards and specifications as outlined in the AASM Manual for the Scoring of Sleep and Associated Events 2.2.0 (2015).   FINDINGS:   1. Mild Obstructive Sleep Apnea with AHI 6.1/hr.   2. No Central Sleep Apnea with pAHIc 0.5/hr.   3. Oxygen desaturations as low as 86%.   4. Minimal snoring was present. O2 sats were < 88% for 0.6 min.   5. Total sleep time was 7 hrs and 33 min.   6. 21.5% of total sleep time was spent in REM sleep.   7. Normal sleep onset latency at 19 min.   8. Shortened REM sleep onset latency at 80 min.   9. Total awakenings were 2.  10. Arrhythmia detection:  None.  DIAGNOSIS: Mild Obstructive Sleep Apnea (G47.33)  RECOMMENDATIONS:   1.  Clinical correlation of these findings is necessary.  The decision to treat obstructive sleep apnea (OSA) is usually based on the presence of apnea symptoms or the presence of associated medical conditions such as Hypertension, Congestive Heart Failure, Atrial Fibrillation or Obesity.  The most common symptoms of OSA are snoring, gasping for breath while sleeping, daytime sleepiness and fatigue.   2.  Initiating apnea therapy is recommended given the presence of symptoms and/or associated conditions. Recommend proceeding with one  of the following:     a.  Auto-CPAP therapy with a pressure range of 5-20cm H2O.     b.  An oral appliance (OA) that can be obtained from certain dentists with expertise in sleep medicine.  These are primarily of use in non-obese patients with mild and moderate disease.     c.  An ENT consultation which may be useful to look for specific causes of obstruction and possible treatment options.     d.  If patient is intolerant to PAP therapy, consider referral to ENT for evaluation for hypoglossal nerve stimulator.   3.  Close follow-up is necessary to ensure success with CPAP or oral appliance therapy for maximum benefit.  4.  A follow-up oximetry study on CPAP is recommended to assess the adequacy of therapy and determine the need for supplemental oxygen or the potential need for Bi-level therapy.  An arterial blood gas to determine the adequacy of baseline ventilation and oxygenation should also be considered.  5.  Healthy sleep recommendations include:  adequate nightly sleep (normal 7-9 hrs/night), avoidance of caffeine after noon and alcohol near bedtime, and maintaining a sleep environment that is cool, dark and quiet.  6.  Weight loss for overweight patients is recommended.  Even modest amounts of weight loss can significantly improve the severity of sleep apnea.  7.  Snoring recommendations include:  weight loss where appropriate, side sleeping, and avoidance of alcohol before bed.  8.  Operation of motor vehicle should be avoided when sleepy.  Signature:   Armanda Magic, MD; [facility]; Diplomat, American Board  of Sleep Medicine Electronically Signed: 05/18/2022 1:53:16 PM

## 2022-05-20 ENCOUNTER — Telehealth: Payer: Self-pay | Admitting: *Deleted

## 2022-05-20 DIAGNOSIS — G4733 Obstructive sleep apnea (adult) (pediatric): Secondary | ICD-10-CM

## 2022-05-20 DIAGNOSIS — I10 Essential (primary) hypertension: Secondary | ICD-10-CM

## 2022-05-20 DIAGNOSIS — R0683 Snoring: Secondary | ICD-10-CM

## 2022-05-20 DIAGNOSIS — I4819 Other persistent atrial fibrillation: Secondary | ICD-10-CM

## 2022-05-20 NOTE — Telephone Encounter (Signed)
The patient has been notified of the result. Left detailed message on voicemail and informed patient to call back..Laquita Harlan Green, CMA   

## 2022-05-20 NOTE — Telephone Encounter (Signed)
-----   Message from Gaynelle Cage, [place] sent at 05/19/2022  3:51 PM EDT -----  ----- Message ----- From: Quintella Reichert, MD Sent: 05/18/2022   1:55 PM EDT To: [facility] Sleep Studies  Please let patient know that they have sleep apnea and recommend treating with CPAP.  Please order an auto CPAP from 4-15cm H2O with heated humidity and mask of choice.  Order overnight pulse ox on CPAP.  Followup with me in 6 weeks.

## 2022-05-24 NOTE — Telephone Encounter (Signed)
The patient has been notified of the result and verbalized understanding.  All questions (if any) were answered. Latrelle Dodrill, CMA 05/24/2022 11:27 AM    Patient will talk things over with his wife and call our office back with his decision.

## 2022-06-13 ENCOUNTER — Encounter: Payer: Self-pay | Admitting: Family Medicine

## 2022-06-13 ENCOUNTER — Ambulatory Visit (INDEPENDENT_AMBULATORY_CARE_PROVIDER_SITE_OTHER): Payer: Medicare HMO | Admitting: Family Medicine

## 2022-06-13 VITALS — BP 118/68 | HR 75 | Temp 98.6°F | Ht 75.0 in | Wt 289.2 lb

## 2022-06-13 DIAGNOSIS — I10 Essential (primary) hypertension: Secondary | ICD-10-CM | POA: Diagnosis not present

## 2022-06-13 DIAGNOSIS — G4733 Obstructive sleep apnea (adult) (pediatric): Secondary | ICD-10-CM

## 2022-06-13 DIAGNOSIS — I4819 Other persistent atrial fibrillation: Secondary | ICD-10-CM

## 2022-06-13 DIAGNOSIS — E785 Hyperlipidemia, unspecified: Secondary | ICD-10-CM | POA: Diagnosis not present

## 2022-06-13 NOTE — Progress Notes (Signed)
Subjective:  Patient ID: Parker Saunders, male    DOB: 09-Jun-1965  Age: 57 y.o. MRN: [id number]  CC:  Chief Complaint  Patient presents with   Hypertension   Hyperlipidemia    HPI Parker Saunders presents for   Hypertension: With history of atrial fibrillation, diagnosed in November.  Treated with losartan HCTZ 100/12.5 mg daily, Toprol-XL 50 mg daily for rate control, flecainide 100 mg daily rhythm control, Eliquis 5 mg twice daily for anticoagulation.    Sleep testing April 9 with mild OSA, AHI 6.1.  No central sleep apneas.  O2 nadir 86%.given other conditions recommended treatment - he is still deciding if he wants to pursue this.  Unfortunately has experienced A-fib recurrence on flecainide, and symptomatic with fatigue.Scheduled for ablation on 09/12/2022. Labs on 08/22/22. CT on 09/05/22.  Home readings: 120/72. No new side effects with meds. No new bleeding. No hematuria/melena/BRBPR.    BP Readings from Last 3 Encounters:  06/13/22 118/68  05/09/22 130/86  03/28/22 136/84   Lab Results  Component Value Date   CREATININE 1.20 03/03/2022   Hyperlipidemia: Lipitor 20 mg daily, no new side effects/myalgias. Last ate 4 hrs ago.  Lab Results  Component Value Date   CHOL 135 12/14/2021   HDL 34.10 (L) 12/14/2021   LDLCALC 80 12/14/2021   TRIG 105.0 12/14/2021   CHOLHDL 4 12/14/2021   Lab Results  Component Value Date   ALT 34 12/14/2021   AST 30 12/14/2021   ALKPHOS 74 12/14/2021   BILITOT 0.5 12/14/2021       History Patient Active Problem List   Diagnosis Date Noted   Persistent atrial fibrillation (HCC) 01/25/2022   Spinal stenosis of lumbar region 02/04/2016   Essential hypertension 01/25/2013   Past Medical History:  Diagnosis Date   Hypertension    Renal stone    Rotator cuff disorder    Past Surgical History:  Procedure Laterality Date   BACK SURGERY  2005   CARDIOVERSION N/A 02/17/2022   Procedure: CARDIOVERSION;  Surgeon: Chrystie Nose, MD;   Location: [facility] ENDOSCOPY;  Service: Cardiovascular;  Laterality: N/A;   LUMBAR LAMINECTOMY/DECOMPRESSION MICRODISCECTOMY Left 02/04/2016   Procedure: REDO MICRO LUMBAR DECOMPRESSION L4-5, L5-S1 ON THE LEFT  2 LEVELS;  Surgeon: [name], MD;  Location: [facility] ORS;  Service: Orthopedics;  Laterality: Left;   SHOULDER SURGERY Right 2014   SPINE SURGERY  02/07/2005   [facility]   No Known Allergies Prior to Admission medications   Medication Sig Start Date End Date Taking? Authorizing Provider  acetaminophen (TYLENOL) 500 MG tablet Take 500 mg by mouth every 6 (six) hours as needed for moderate pain or mild pain.   Yes [provider]  apixaban (ELIQUIS) 5 MG TABS tablet TAKE 1 TABLET(5 MG) BY MOUTH TWICE DAILY 03/31/22  Yes Lennette Bihari, MD  atorvastatin (LIPITOR) 20 MG tablet Take 1 tablet (20 mg total) by mouth daily. 12/13/21  Yes Shade Flood, MD  flecainide (TAMBOCOR) 100 MG tablet Take 1 tablet (100 mg total) by mouth 2 (two) times daily. 02/24/22  Yes Fenton, Clint R, PA  losartan-hydrochlorothiazide (HYZAAR) 100-12.5 MG tablet TAKE 1 TABLET BY MOUTH DAILY 01/19/22  Yes Shade Flood, MD  metoprolol succinate (TOPROL-XL) 50 MG 24 hr tablet Take 1 tablet (50 mg total) by mouth daily. Take with or immediately following a meal. 12/22/21 12/17/22 Yes Lennette Bihari, MD   Social History   Socioeconomic History   Marital status: Married  Spouse name: Not on file   Number of children: 2   Years of education: Not on file   Highest education level: 12th grade  Occupational History   Occupation: unemployed    Comment: since 2015  Tobacco Use   Smoking status: Never   Smokeless tobacco: Never   Tobacco comments:    Never smoke 02/24/22  Substance and Sexual Activity   Alcohol use: No   Drug use: No   Sexual activity: Yes  Other Topics Concern   Not on file  Social History Narrative   Marital status: married      Children: 2 daughters      Lives: with wife, 2  daughters      Employment: unemployed after termination in 2015     Tobacco: none      Alcohol: none      Exercise:  Walking 4-5 miles daily   Social Determinants of Health   Financial Resource Strain: Low Risk  (06/10/2022)   Overall Financial Resource Strain (CARDIA)    Difficulty of Paying Living Expenses: Not hard at all  Food Insecurity: No Food Insecurity (06/10/2022)   Hunger Vital Sign    Worried About Running Out of Food in the Last Year: Never true    Ran Out of Food in the Last Year: Never true  Transportation Needs: No Transportation Needs (06/10/2022)   PRAPARE - Administrator, Civil Service (Medical): No    Lack of Transportation (Non-Medical): No  Physical Activity: Insufficiently Active (06/10/2022)   Exercise Vital Sign    Days of Exercise per Week: 4 days    Minutes of Exercise per Session: 30 min  Stress: No Stress Concern Present (06/10/2022)   [employer] of Occupational Health - Occupational Stress Questionnaire    Feeling of Stress : Not at all  Social Connections: Moderately Isolated (06/10/2022)   Social Connection and Isolation Panel [NHANES]    Frequency of Communication with Friends and Family: Once a week    Frequency of Social Gatherings with Friends and Family: Once a week    Attends Religious Services: 1 to 4 times per year    Active Member of [employer] or Organizations: No    Attends Banker Meetings: Never    Marital Status: Married  Catering manager Violence: Not At Risk (07/15/2021)   Humiliation, Afraid, Rape, and Kick questionnaire    Fear of Current or Ex-Partner: No    Emotionally Abused: No    Physically Abused: No    Sexually Abused: No    Review of Systems  Constitutional:  Positive for fatigue (only at time of afib.). Negative for unexpected weight change.  Eyes:  Negative for visual disturbance.  Respiratory:  Negative for cough, chest tightness and shortness of breath.   Cardiovascular:  Negative for chest pain,  palpitations and leg swelling.  Gastrointestinal:  Negative for abdominal pain and blood in stool.  Neurological:  Negative for dizziness, light-headedness and headaches.     Objective:   Vitals:   06/13/22 1445  BP: 118/68  Pulse: 75  Temp: 98.6 F (37 C)  TempSrc: Temporal  SpO2: 98%  Weight: 289 lb 3.2 oz (131.2 kg)  Height: 6\' 3"  (1.905 m)     Physical Exam Vitals reviewed.  Constitutional:      Appearance: He is well-developed.  HENT:     Head: Normocephalic and atraumatic.  Neck:     Vascular: No carotid bruit or JVD.  Cardiovascular:  Rate and Rhythm: Normal rate and regular rhythm.     Heart sounds: Normal heart sounds. No murmur heard. Pulmonary:     Effort: Pulmonary effort is normal.     Breath sounds: Normal breath sounds. No rales.  Musculoskeletal:     Right lower leg: No edema.     Left lower leg: No edema.  Skin:    General: Skin is warm and dry.  Neurological:     Mental Status: He is alert and oriented to person, place, and time.  Psychiatric:        Mood and Affect: Mood normal.        Assessment & Plan:  Parker Saunders is a 57 y.o. male . Hyperlipidemia, unspecified hyperlipidemia type - Plan: Lipid panel  -  Stable, tolerating current regimen. Medications refilled. Labs pending as above.   Essential hypertension - Plan: Comprehensive metabolic panel  -  Stable, tolerating current regimen. Medications refilled. Labs pending as above.   OSA (obstructive sleep apnea)  -Mild, he is deciding about possible treatment options.  Persistent atrial fibrillation (HCC)  -Unfortunately recurrent, symptomatic, plan for ablation as above.  Tolerating anticoagulation and current meds including flecainide, Toprol for now.  Continue follow-up with cardiology as planned.  Regular rhythm in office today.  No orders of the defined types were placed in this encounter.  Patient Instructions  Thanks for coming in today.  I will check some screening  labs for your medications but keep follow-up as planned with cardiology.  Let me know if there are any questions.  Hang in there.     Signed,   Meredith Staggers, MD [facility], [facility] 06/13/22 3:22 PM

## 2022-06-13 NOTE — Patient Instructions (Signed)
Thanks for coming in today.  I will check some screening labs for your medications but keep follow-up as planned with cardiology.  Let me know if there are any questions.  Hang in there.

## 2022-06-14 LAB — COMPREHENSIVE METABOLIC PANEL
ALT: 32 U/L (ref 0–53)
AST: 22 U/L (ref 0–37)
Albumin: 4.2 g/dL (ref 3.5–5.2)
Alkaline Phosphatase: 78 U/L (ref 39–117)
BUN: 18 mg/dL (ref 6–23)
CO2: 29 mEq/L (ref 19–32)
Calcium: 9.4 mg/dL (ref 8.4–10.5)
Chloride: 102 mEq/L (ref 96–112)
Creatinine, Ser: 1.17 mg/dL (ref 0.40–1.50)
GFR: 68.99 mL/min (ref >60.00)
Glucose, Bld: 72 mg/dL (ref 70–99)
Potassium: 4.4 mEq/L (ref 3.5–5.1)
Sodium: 138 mEq/L (ref 135–145)
Total Bilirubin: 0.5 mg/dL (ref 0.2–1.2)
Total Protein: 7.5 g/dL (ref 6.0–8.3)

## 2022-06-14 LAB — LIPID PANEL
Cholesterol: 136 mg/dL (ref 0–200)
HDL: 30.5 mg/dL — ABNORMAL LOW (ref >39.00)
NonHDL: 105.86
Total CHOL/HDL Ratio: 4
Triglycerides: 212 mg/dL — ABNORMAL HIGH (ref 0.0–149.0)
VLDL: 42.4 mg/dL — ABNORMAL HIGH (ref 0.0–40.0)

## 2022-06-14 LAB — LDL CHOLESTEROL, DIRECT: Direct LDL: 88 mg/dL

## 2022-06-18 ENCOUNTER — Other Ambulatory Visit (HOSPITAL_COMMUNITY): Payer: Self-pay | Admitting: Physician Assistant

## 2022-06-20 ENCOUNTER — Other Ambulatory Visit (HOSPITAL_COMMUNITY): Payer: Self-pay | Admitting: *Deleted

## 2022-06-20 MED ORDER — FLECAINIDE ACETATE 100 MG PO TABS: ORAL_TABLET | ORAL | 6 refills | Status: DC

## 2022-07-28 ENCOUNTER — Ambulatory Visit (INDEPENDENT_AMBULATORY_CARE_PROVIDER_SITE_OTHER): Payer: Medicare HMO | Admitting: *Deleted

## 2022-07-28 DIAGNOSIS — Z0000 Encounter for general adult medical examination without abnormal findings: Secondary | ICD-10-CM

## 2022-07-28 NOTE — Patient Instructions (Signed)
Parker Saunders , Thank you for taking time to come for your Medicare Wellness Visit. I appreciate your ongoing commitment to your health goals. Please review the following plan we discussed and let me know if I can assist you in the future.   Screening recommendations/referrals: Colonoscopy: up to date Recommended yearly ophthalmology/optometry visit for glaucoma screening and checkup Recommended yearly dental visit for hygiene and checkup  Vaccinations: Influenza vaccine: Education provided Tdap vaccine: up to date Shingles vaccine: Education provided    Advanced directives: Education provided   Preventive Care 40-64 Years, Male Preventive care refers to lifestyle choices and visits with your health care provider that can promote health and wellness. What does preventive care include? A yearly physical exam. This is also called an annual well check. Dental exams once or twice a year. Routine eye exams. Ask your health care provider how often you should have your eyes checked. Personal lifestyle choices, including: Daily care of your teeth and gums. Regular physical activity. Eating a healthy diet. Avoiding tobacco and drug use. Limiting alcohol use. Practicing safe sex. Taking low-dose aspirin every day starting at age 66. What happens during an annual well check? The services and screenings done by your health care provider during your annual well check will depend on your age, overall health, lifestyle risk factors, and family history of disease. Counseling  Your health care provider may ask you questions about your: Alcohol use. Tobacco use. Drug use. Emotional well-being. Home and relationship well-being. Sexual activity. Eating habits. Work and work Astronomer. Screening  You may have the following tests or measurements: Height, weight, and BMI. Blood pressure. Lipid and cholesterol levels. These may be checked every 5 years, or more frequently if you are over 58  years old. Skin check. Lung cancer screening. You may have this screening every year starting at age 39 if you have a 30-pack-year history of smoking and currently smoke or have quit within the past 15 years. Fecal occult blood test (FOBT) of the stool. You may have this test every year starting at age 46. Flexible sigmoidoscopy or colonoscopy. You may have a sigmoidoscopy every 5 years or a colonoscopy every 10 years starting at age 48. Prostate cancer screening. Recommendations will vary depending on your family history and other risks. Hepatitis C blood test. Hepatitis B blood test. Sexually transmitted disease (STD) testing. Diabetes screening. This is done by checking your blood sugar (glucose) after you have not eaten for a while (fasting). You may have this done every 1-3 years. Discuss your test results, treatment options, and if necessary, the need for more tests with your health care provider. Vaccines  Your health care provider may recommend certain vaccines, such as: Influenza vaccine. This is recommended every year. Tetanus, diphtheria, and acellular pertussis (Tdap, Td) vaccine. You may need a Td booster every 10 years. Zoster vaccine. You may need this after age 18. Pneumococcal 13-valent conjugate (PCV13) vaccine. You may need this if you have certain conditions and have not been vaccinated. Pneumococcal polysaccharide (PPSV23) vaccine. You may need one or two doses if you smoke cigarettes or if you have certain conditions. Talk to your health care provider about which screenings and vaccines you need and how often you need them. This information is not intended to replace advice given to you by your health care provider. Make sure you discuss any questions you have with your health care provider. Document Released: 02/20/2015 Document Revised: 10/14/2015 Document Reviewed: 11/25/2014 Elsevier Interactive Patient Education  2017 Elsevier  Inc.  Fall Prevention in the  Home Falls can cause injuries. They can happen to people of all ages. There are many things you can do to make your home safe and to help prevent falls. What can I do on the outside of my home? Regularly fix the edges of walkways and driveways and fix any cracks. Remove anything that might make you trip as you walk through a door, such as a raised step or threshold. Trim any bushes or trees on the path to your home. Use bright outdoor lighting. Clear any walking paths of anything that might make someone trip, such as rocks or tools. Regularly check to see if handrails are loose or broken. Make sure that both sides of any steps have handrails. Any raised decks and porches should have guardrails on the edges. Have any leaves, snow, or ice cleared regularly. Use sand or salt on walking paths during winter. Clean up any spills in your garage right away. This includes oil or grease spills. What can I do in the bathroom? Use night lights. Install grab bars by the toilet and in the tub and shower. Do not use towel bars as grab bars. Use non-skid mats or decals in the tub or shower. If you need to sit down in the shower, use a plastic, non-slip stool. Keep the floor dry. Clean up any water that spills on the floor as soon as it happens. Remove soap buildup in the tub or shower regularly. Attach bath mats securely with double-sided non-slip rug tape. Do not have throw rugs and other things on the floor that can make you trip. What can I do in the bedroom? Use night lights. Make sure that you have a light by your bed that is easy to reach. Do not use any sheets or blankets that are too big for your bed. They should not hang down onto the floor. Have a firm chair that has side arms. You can use this for support while you get dressed. Do not have throw rugs and other things on the floor that can make you trip. What can I do in the kitchen? Clean up any spills right away. Avoid walking on wet  floors. Keep items that you use a lot in easy-to-reach places. If you need to reach something above you, use a strong step stool that has a grab bar. Keep electrical cords out of the way. Do not use floor polish or wax that makes floors slippery. If you must use wax, use non-skid floor wax. Do not have throw rugs and other things on the floor that can make you trip. What can I do with my stairs? Do not leave any items on the stairs. Make sure that there are handrails on both sides of the stairs and use them. Fix handrails that are broken or loose. Make sure that handrails are as long as the stairways. Check any carpeting to make sure that it is firmly attached to the stairs. Fix any carpet that is loose or worn. Avoid having throw rugs at the top or bottom of the stairs. If you do have throw rugs, attach them to the floor with carpet tape. Make sure that you have a light switch at the top of the stairs and the bottom of the stairs. If you do not have them, ask someone to add them for you. What else can I do to help prevent falls? Wear shoes that: Do not have high heels. Have rubber bottoms. Are comfortable  and fit you well. Are closed at the toe. Do not wear sandals. If you use a stepladder: Make sure that it is fully opened. Do not climb a closed stepladder. Make sure that both sides of the stepladder are locked into place. Ask someone to hold it for you, if possible. Clearly mark and make sure that you can see: Any grab bars or handrails. First and last steps. Where the edge of each step is. Use tools that help you move around (mobility aids) if they are needed. These include: Canes. Walkers. Scooters. Crutches. Turn on the lights when you go into a dark area. Replace any light bulbs as soon as they burn out. Set up your furniture so you have a clear path. Avoid moving your furniture around. If any of your floors are uneven, fix them. If there are any pets around you, be aware of  where they are. Review your medicines with your doctor. Some medicines can make you feel dizzy. This can increase your chance of falling. Ask your doctor what other things that you can do to help prevent falls. This information is not intended to replace advice given to you by your health care provider. Make sure you discuss any questions you have with your health care provider. Document Released: 11/20/2008 Document Revised: 07/02/2015 Document Reviewed: 02/28/2014 Elsevier Interactive Patient Education  2017 [employer].

## 2022-07-28 NOTE — Progress Notes (Signed)
Subjective:   Parker Saunders is a 57 y.o. male who presents for Medicare Annual/Subsequent preventive examination.  Visit Complete: Virtual  I connected with  Parker Saunders on 07/28/22 by a audio enabled telemedicine application and verified that I am speaking with the correct person using two identifiers.  Patient Location: Home  Provider Location: Home Office  I discussed the limitations of evaluation and management by telemedicine. The patient expressed understanding and agreed to proceed.  Patient Medicare AWV questionnaire was completed by the patient on 07-27-2022; I have confirmed that all information answered by patient is correct and no changes since this date.  Review of Systems     Cardiac Risk Factors include: advanced age (>63men, >77 women);male gender;hypertension;obesity (BMI >30kg/m2)     Objective:    Today's Vitals   There is no height or weight on file to calculate BMI.     07/28/2022    9:37 AM 03/15/2022    6:50 AM 02/17/2022    8:12 AM 07/15/2021    4:10 PM 06/04/2020   10:16 AM 06/03/2019   11:03 AM 02/04/2016    3:07 PM  Advanced Directives  Does Patient Have a Medical Advance Directive? No No No No No No No  Would patient like information on creating a medical advance directive? No - Patient declined  No - Patient declined No - Patient declined No - Patient declined Yes (ED - Information included in AVS) No - Patient declined    Current Medications (verified) Outpatient Encounter Medications as of 07/28/2022  Medication Sig   acetaminophen (TYLENOL) 500 MG tablet Take 500 mg by mouth every 6 (six) hours as needed for moderate pain or mild pain.   apixaban (ELIQUIS) 5 MG TABS tablet TAKE 1 TABLET(5 MG) BY MOUTH TWICE DAILY   atorvastatin (LIPITOR) 20 MG tablet Take 1 tablet (20 mg total) by mouth daily.   flecainide (TAMBOCOR) 100 MG tablet TAKE 1 TABLET(100 MG) BY MOUTH TWICE DAILY   losartan-hydrochlorothiazide (HYZAAR) 100-12.5 MG tablet TAKE 1  TABLET BY MOUTH DAILY   metoprolol succinate (TOPROL-XL) 50 MG 24 hr tablet Take 1 tablet (50 mg total) by mouth daily. Take with or immediately following a meal.   No facility-administered encounter medications on file as of 07/28/2022.    Allergies (verified) Patient has no known allergies.   History: Past Medical History:  Diagnosis Date   Hypertension    Renal stone    Rotator cuff disorder    Past Surgical History:  Procedure Laterality Date   BACK SURGERY  2005   CARDIOVERSION N/A 02/17/2022   Procedure: CARDIOVERSION;  Surgeon: Chrystie Nose, MD;  Location: [facility] ENDOSCOPY;  Service: Cardiovascular;  Laterality: N/A;   LUMBAR LAMINECTOMY/DECOMPRESSION MICRODISCECTOMY Left 02/04/2016   Procedure: REDO MICRO LUMBAR DECOMPRESSION L4-5, L5-S1 ON THE LEFT  2 LEVELS;  Surgeon: [name], MD;  Location: [facility] ORS;  Service: Orthopedics;  Laterality: Left;   SHOULDER SURGERY Right 2014   SPINE SURGERY  02/07/2005   [facility]   Family History  Problem Relation Age of Onset   Hyperlipidemia Mother    Hypertension Mother    COPD Father    Hyperlipidemia Father    Hypertension Father    Social History   Socioeconomic History   Marital status: Married    Spouse name: Not on file   Number of children: 2   Years of education: Not on file   Highest education level: 12th grade  Occupational History   Occupation: unemployed  Comment: since 2015  Tobacco Use   Smoking status: Never   Smokeless tobacco: Never   Tobacco comments:    Never smoke 02/24/22  Substance and Sexual Activity   Alcohol use: No   Drug use: No   Sexual activity: Yes  Other Topics Concern   Not on file  Social History Narrative   Marital status: married      Children: 2 daughters      Lives: with wife, 2 daughters      Employment: unemployed after termination in 2015     Tobacco: none      Alcohol: none      Exercise:  Walking 4-5 miles daily   Social Determinants of Health   Financial  Resource Strain: Low Risk  (07/28/2022)   Overall Financial Resource Strain (CARDIA)    Difficulty of Paying Living Expenses: Not hard at all  Food Insecurity: No Food Insecurity (07/28/2022)   Hunger Vital Sign    Worried About Running Out of Food in the Last Year: Never true    Ran Out of Food in the Last Year: Never true  Transportation Needs: No Transportation Needs (07/28/2022)   PRAPARE - Administrator, Civil Service (Medical): No    Lack of Transportation (Non-Medical): No  Physical Activity: Insufficiently Active (07/28/2022)   Exercise Vital Sign    Days of Exercise per Week: 3 days    Minutes of Exercise per Session: 30 min  Stress: No Stress Concern Present (07/28/2022)   [employer] of Occupational Health - Occupational Stress Questionnaire    Feeling of Stress : Not at all  Social Connections: Moderately Isolated (07/28/2022)   Social Connection and Isolation Panel [NHANES]    Frequency of Communication with Friends and Family: Once a week    Frequency of Social Gatherings with Friends and Family: Never    Attends Religious Services: More than 4 times per year    Active Member of [employer] or Organizations: No    Attends Banker Meetings: Never    Marital Status: Married    Tobacco Counseling Counseling given: Not Answered Tobacco comments: Never smoke 02/24/22   Clinical Intake:  Pre-visit preparation completed: Yes  Pain : No/denies pain     Diabetes: No  How often do you need to have someone help you when you read instructions, pamphlets, or other written materials from your doctor or pharmacy?: 1 - Never  Interpreter Needed?: No  Information entered by :: Parker Haggard LPN   Activities of Daily Living    07/28/2022    9:36 AM 07/25/2022    4:39 PM  In your present state of health, do you have any difficulty performing the following activities:  Hearing? 0 0  Vision? 0 0  Difficulty concentrating or making decisions? 0 0   Walking or climbing stairs? 0 0  Dressing or bathing? 0 0  Doing errands, shopping? 0 0  Preparing Food and eating ? N N  Using the Toilet? N N  In the past six months, have you accidently leaked urine? N N  Do you have problems with loss of bowel control? N N  Managing your Medications? N N  Managing your Finances? N N  Housekeeping or managing your Housekeeping? N N    Patient Care Team: Shade Flood, MD as PCP - General (Family Medicine) Mealor, Roberts Gaudy, MD as PCP - Electrophysiology (Cardiology)  Indicate any recent Medical Services you may have received from other than [facility]  providers in the past year (date may be approximate).     Assessment:   This is a routine wellness examination for Mervyn.  Hearing/Vision screen Hearing Screening - Comments:: No trouble hearing Vision Screening - Comments:: Up to date [facility]  Dietary issues and exercise activities discussed:     Goals Addressed             This Visit's Progress    Weight (lb) < 200 lb (90.7 kg)         Depression Screen    07/28/2022    9:40 AM 06/13/2022    2:42 PM 12/13/2021    2:43 PM 07/15/2021    4:11 PM 07/15/2021    4:09 PM 06/10/2021    3:17 PM 12/04/2020    9:48 AM  PHQ 2/9 Scores  PHQ - 2 Score 0 0 0 0 0 0 0  PHQ- 9 Score 2 0 0   0     Fall Risk    07/28/2022    9:35 AM 07/25/2022    4:39 PM 06/13/2022    2:42 PM 12/13/2021    2:43 PM 07/15/2021    4:11 PM  Fall Risk   Falls in the past year? 0 0 0 0 0  Number falls in past yr:  0  0 0  Injury with Fall?  0  0 0  Risk for fall due to :   No Fall Risks No Fall Risks   Risk for fall due to: Comment     uses cane  Follow up    Falls evaluation completed Falls evaluation completed    MEDICARE RISK AT HOME:  Medicare Risk at Home - 07/28/22 0941     Any stairs in or around the home? No    If so, are there any without handrails? No    Home free of loose throw rugs in walkways, pet beds, electrical cords, etc? Yes    Adequate  lighting in your home to reduce risk of falls? Yes    Life alert? No    Use of a cane, walker or w/c? No    Grab bars in the bathroom? No    Shower chair or bench in shower? No    Elevated toilet seat or a handicapped toilet? No             TIMED UP AND GO:  Was the test performed?  No    Cognitive Function:        07/28/2022    9:38 AM 06/03/2019   11:04 AM  6CIT Screen  What Year? 0 points 0 points  What month? 0 points 0 points  What time? 0 points 0 points  Count back from 20 0 points 0 points  Months in reverse 0 points 0 points  Repeat phrase 0 points 0 points  Total Score 0 points 0 points    Immunizations Immunization History  Administered Date(s) Administered   Influenza,inj,Quad PF,6+ Mos 12/04/2020   Tdap 01/16/2018    TDAP status: Up to date  Flu Vaccine status: Due, Education has been provided regarding the importance of this vaccine. Advised may receive this vaccine at local pharmacy or Health Dept. Aware to provide a copy of the vaccination record if obtained from local pharmacy or Health Dept. Verbalized acceptance and understanding.    Covid-19 vaccine status: Information provided on how to obtain vaccines.   Qualifies for Shingles Vaccine? Yes   Zostavax completed No   Shingrix Completed?: No.  Education has been provided regarding the importance of this vaccine. Patient has been advised to call insurance company to determine out of pocket expense if they have not yet received this vaccine. Advised may also receive vaccine at local pharmacy or Health Dept. Verbalized acceptance and understanding.  Screening Tests Health Maintenance  Topic Date Due   COVID-19 Vaccine (1) Never done   Medicare Annual Wellness (AWV)  07/28/2023   Fecal DNA (Cologuard)  04/21/2024   DTaP/Tdap/Td (2 - Td or Tdap) 01/17/2028   Hepatitis C Screening  Completed   HIV Screening  Completed   HPV VACCINES  Aged Out   INFLUENZA VACCINE  Discontinued   Zoster  Vaccines- Shingrix  Discontinued    Health Maintenance  Health Maintenance Due  Topic Date Due   COVID-19 Vaccine (1) Never done    Colorectal cancer screening: Type of screening: Cologuard. Completed 2023. Repeat every 3 years  Lung Cancer Screening: (Low Dose CT Chest recommended if Age 46-80 years, 20 pack-year currently smoking OR have quit w/in 15years.) does not qualify.   Lung Cancer Screening Referral:   Additional Screening:  Hepatitis C Screening: does not qualify; Completed 2018  Vision Screening: Recommended annual ophthalmology exams for early detection of glaucoma and other disorders of the eye. Is the patient up to date with their annual eye exam?  Yes  Who is the provider or what is the name of the office in which the patient attends annual eye exams? [facility] If pt is not established with a provider, would they like to be referred to a provider to establish care? No .   Dental Screening: Recommended annual dental exams for proper oral hygiene    Community Resource Referral / Chronic Care Management: CRR required this visit?  No   CCM required this visit?  No     Plan:     I have personally reviewed and noted the following in the patient's chart:   Medical and social history Use of alcohol, tobacco or illicit drugs  Current medications and supplements including opioid prescriptions. Patient is not currently taking opioid prescriptions. Functional ability and status Nutritional status Physical activity Advanced directives List of other physicians Hospitalizations, surgeries, and ER visits in previous 12 months Vitals Screenings to include cognitive, depression, and falls Referrals and appointments  In addition, I have reviewed and discussed with patient certain preventive protocols, quality metrics, and best practice recommendations. A written personalized care plan for preventive services as well as general preventive health recommendations  were provided to patient.     Parker Haggard, LPN   1/61/0960   After Visit Summary: (MyChart) Due to this being a telephonic visit, the after visit summary with patients personalized plan was offered to patient via MyChart   Nurse Notes:

## 2022-08-22 ENCOUNTER — Ambulatory Visit: Payer: Medicare HMO | Attending: Cardiovascular Disease

## 2022-08-22 DIAGNOSIS — I4819 Other persistent atrial fibrillation: Secondary | ICD-10-CM | POA: Diagnosis not present

## 2022-08-23 LAB — CBC
Hematocrit: 45.7 % (ref 37.5–51.0)
Hemoglobin: 15.2 g/dL (ref 13.0–17.7)
MCH: 28.5 pg (ref 26.6–33.0)
MCHC: 33.3 g/dL (ref 31.5–35.7)
MCV: 86 fL (ref 79–97)
Platelets: 325 10*3/uL (ref 150–450)
RBC: 5.33 x10E6/uL (ref 4.14–5.80)
RDW: 12.5 % (ref 11.6–15.4)
WBC: 10.5 10*3/uL (ref 3.4–10.8)

## 2022-08-23 LAB — BASIC METABOLIC PANEL
BUN/Creatinine Ratio: 18 (ref 9–20)
BUN: 23 mg/dL (ref 6–24)
CO2: 24 mmol/L (ref 20–29)
Calcium: 9.4 mg/dL (ref 8.7–10.2)
Chloride: 103 mmol/L (ref 96–106)
Creatinine, Ser: 1.28 mg/dL — ABNORMAL HIGH (ref 0.76–1.27)
Glucose: 107 mg/dL — ABNORMAL HIGH (ref 70–99)
Potassium: 4.4 mmol/L (ref 3.5–5.2)
Sodium: 142 mmol/L (ref 134–144)
eGFR: 65 mL/min/{1.73_m2} (ref >59)

## 2022-09-05 ENCOUNTER — Ambulatory Visit (HOSPITAL_COMMUNITY)
Admission: RE | Admit: 2022-09-05 | Discharge: 2022-09-05 | Disposition: A | Discharge status: Home / Self Care | Payer: Medicare HMO | Source: Ambulatory Visit | Attending: Cardiovascular Disease | Admitting: Cardiovascular Disease

## 2022-09-05 DIAGNOSIS — I4819 Other persistent atrial fibrillation: Secondary | ICD-10-CM | POA: Insufficient documentation

## 2022-09-05 MED ORDER — IOHEXOL 350 MG/ML SOLN
100.0000 mL | Freq: Once | INTRAVENOUS | Status: AC | PRN
  Administered 2022-09-05: 100 mL via INTRAVENOUS

## 2022-09-09 NOTE — Pre-Procedure Instructions (Signed)
Instructed patient on the following items: Arrival time 1130 Nothing to eat or drink after midnight No meds AM of procedure Responsible person to drive you home and stay with you for 24 hrs  Have you missed any doses of anti-coagulant Eliquis- takes twice a day, hasn't missed any doses.  Don't take on Monday morning.

## 2022-09-12 ENCOUNTER — Other Ambulatory Visit (HOSPITAL_COMMUNITY): Payer: Self-pay

## 2022-09-12 ENCOUNTER — Other Ambulatory Visit: Payer: Self-pay

## 2022-09-12 ENCOUNTER — Encounter (HOSPITAL_COMMUNITY)
Admission: RE | Disposition: A | Discharge status: Home / Self Care | Payer: Self-pay | Source: Home / Self Care | Attending: Cardiovascular Disease

## 2022-09-12 ENCOUNTER — Ambulatory Visit (HOSPITAL_COMMUNITY): Payer: Medicare HMO | Admitting: Anesthesiology

## 2022-09-12 ENCOUNTER — Ambulatory Visit (HOSPITAL_COMMUNITY)
Admission: RE | Admit: 2022-09-12 | Discharge: 2022-09-12 | Disposition: A | Discharge status: Home / Self Care | Payer: Medicare HMO | Source: Home / Self Care | Attending: Cardiovascular Disease | Admitting: Cardiovascular Disease

## 2022-09-12 DIAGNOSIS — Z79899 Other long term (current) drug therapy: Secondary | ICD-10-CM | POA: Diagnosis not present

## 2022-09-12 DIAGNOSIS — I4819 Other persistent atrial fibrillation: Secondary | ICD-10-CM | POA: Insufficient documentation

## 2022-09-12 DIAGNOSIS — I4891 Unspecified atrial fibrillation: Secondary | ICD-10-CM | POA: Diagnosis not present

## 2022-09-12 DIAGNOSIS — I10 Essential (primary) hypertension: Secondary | ICD-10-CM | POA: Diagnosis not present

## 2022-09-12 DIAGNOSIS — Z7901 Long term (current) use of anticoagulants: Secondary | ICD-10-CM | POA: Diagnosis not present

## 2022-09-12 DIAGNOSIS — E785 Hyperlipidemia, unspecified: Secondary | ICD-10-CM | POA: Diagnosis not present

## 2022-09-12 DIAGNOSIS — D6869 Other thrombophilia: Secondary | ICD-10-CM | POA: Diagnosis not present

## 2022-09-12 DIAGNOSIS — J449 Chronic obstructive pulmonary disease, unspecified: Secondary | ICD-10-CM | POA: Diagnosis not present

## 2022-09-12 HISTORY — PX: ATRIAL FIBRILLATION ABLATION: EP1191

## 2022-09-12 LAB — POCT ACTIVATED CLOTTING TIME: Activated Clotting Time: 348 seconds

## 2022-09-12 SURGERY — ATRIAL FIBRILLATION ABLATION
Anesthesia: General

## 2022-09-12 MED ORDER — SUGAMMADEX SODIUM 200 MG/2ML IV SOLN
INTRAVENOUS | Status: DC | PRN
  Administered 2022-09-12: 400 mg via INTRAVENOUS

## 2022-09-12 MED ORDER — LIDOCAINE 2% (20 MG/ML) 5 ML SYRINGE
INTRAMUSCULAR | Status: DC | PRN
  Administered 2022-09-12: 100 mg via INTRAVENOUS

## 2022-09-12 MED ORDER — DOBUTAMINE INFUSION FOR EP/ECHO/NUC (1000 MCG/ML)
INTRAVENOUS | Status: DC | PRN
Start: 2022-09-12 — End: 2022-09-12
  Administered 2022-09-12: 20 ug/kg/min via INTRAVENOUS

## 2022-09-12 MED ORDER — ACETAMINOPHEN 325 MG PO TABS
650.0000 mg | ORAL_TABLET | ORAL | Status: DC | PRN
  Filled 2022-09-12: qty 2

## 2022-09-12 MED ORDER — DEXAMETHASONE SODIUM PHOSPHATE 10 MG/ML IJ SOLN
INTRAMUSCULAR | Status: DC | PRN
  Administered 2022-09-12: 5 mg via INTRAVENOUS

## 2022-09-12 MED ORDER — FENTANYL CITRATE (PF) 100 MCG/2ML IJ SOLN
INTRAMUSCULAR | Status: DC | PRN
  Administered 2022-09-12: 100 ug via INTRAVENOUS

## 2022-09-12 MED ORDER — DOBUTAMINE INFUSION FOR EP/ECHO/NUC (1000 MCG/ML)
INTRAVENOUS | Status: AC
  Filled 2022-09-12: qty 250

## 2022-09-12 MED ORDER — HEPARIN SODIUM (PORCINE) 1000 UNIT/ML IJ SOLN
INTRAMUSCULAR | Status: DC | PRN
  Administered 2022-09-12: 1000 [IU] via INTRAVENOUS

## 2022-09-12 MED ORDER — ONDANSETRON HCL 4 MG/2ML IJ SOLN: 4.0000 mg | Freq: Once | INTRAMUSCULAR | Status: DC | PRN

## 2022-09-12 MED ORDER — SODIUM CHLORIDE 0.9 % IV SOLN: INTRAVENOUS | Status: DC

## 2022-09-12 MED ORDER — ONDANSETRON HCL 4 MG/2ML IJ SOLN
INTRAMUSCULAR | Status: DC | PRN
Start: 2022-09-12 — End: 2022-09-12
  Administered 2022-09-12: 4 mg via INTRAVENOUS

## 2022-09-12 MED ORDER — FENTANYL CITRATE (PF) 100 MCG/2ML IJ SOLN: 25.0000 ug | INTRAMUSCULAR | Status: DC | PRN

## 2022-09-12 MED ORDER — SODIUM CHLORIDE 0.9 % IV SOLN: 250.0000 mL | INTRAVENOUS | Status: DC | PRN

## 2022-09-12 MED ORDER — COLCHICINE 0.6 MG PO TABS
0.6000 mg | ORAL_TABLET | Freq: Two times a day (BID) | ORAL | 0 refills | Status: DC
  Filled 2022-09-12: qty 10, 5d supply, fill #0

## 2022-09-12 MED ORDER — OXYCODONE HCL 5 MG PO TABS: 5.0000 mg | ORAL_TABLET | Freq: Once | ORAL | Status: DC | PRN

## 2022-09-12 MED ORDER — PANTOPRAZOLE SODIUM 40 MG PO TBEC
40.0000 mg | DELAYED_RELEASE_TABLET | Freq: Every day | ORAL | 0 refills | Status: DC
  Filled 2022-09-12: qty 30, 30d supply, fill #0

## 2022-09-12 MED ORDER — PHENYLEPHRINE HCL (PRESSORS) 10 MG/ML IV SOLN
INTRAVENOUS | Status: DC | PRN
Start: 2022-09-12 — End: 2022-09-12
  Administered 2022-09-12: 80 ug via INTRAVENOUS

## 2022-09-12 MED ORDER — ACETAMINOPHEN 10 MG/ML IV SOLN: 1000.0000 mg | Freq: Once | INTRAVENOUS | Status: DC | PRN

## 2022-09-12 MED ORDER — FENTANYL CITRATE (PF) 100 MCG/2ML IJ SOLN
INTRAMUSCULAR | Status: AC
  Filled 2022-09-12: qty 2

## 2022-09-12 MED ORDER — SODIUM CHLORIDE 0.9% FLUSH: 3.0000 mL | INTRAVENOUS | Status: DC | PRN

## 2022-09-12 MED ORDER — HEPARIN SODIUM (PORCINE) 1000 UNIT/ML IJ SOLN
INTRAMUSCULAR | Status: AC
  Filled 2022-09-12: qty 10

## 2022-09-12 MED ORDER — OXYCODONE HCL 5 MG/5ML PO SOLN: 5.0000 mg | Freq: Once | ORAL | Status: DC | PRN

## 2022-09-12 MED ORDER — PROPOFOL 10 MG/ML IV BOLUS
INTRAVENOUS | Status: DC | PRN
Start: 2022-09-12 — End: 2022-09-12
  Administered 2022-09-12: 150 mg via INTRAVENOUS

## 2022-09-12 MED ORDER — SODIUM CHLORIDE 0.9% FLUSH: 3.0000 mL | Freq: Two times a day (BID) | INTRAVENOUS | Status: DC

## 2022-09-12 MED ORDER — ROCURONIUM BROMIDE 10 MG/ML (PF) SYRINGE
PREFILLED_SYRINGE | INTRAVENOUS | Status: DC | PRN
  Administered 2022-09-12: 80 mg via INTRAVENOUS
  Administered 2022-09-12: 20 mg via INTRAVENOUS

## 2022-09-12 MED ORDER — HEPARIN SODIUM (PORCINE) 1000 UNIT/ML IJ SOLN
INTRAMUSCULAR | Status: DC | PRN
  Administered 2022-09-12: 21000 [IU] via INTRAVENOUS

## 2022-09-12 MED ORDER — PHENYLEPHRINE HCL-NACL 20-0.9 MG/250ML-% IV SOLN
INTRAVENOUS | Status: DC | PRN
  Administered 2022-09-12: 50 ug/min via INTRAVENOUS

## 2022-09-12 MED ORDER — HEPARIN (PORCINE) IN NACL 1000-0.9 UT/500ML-% IV SOLN
INTRAVENOUS | Status: DC | PRN
  Administered 2022-09-12 (×4): 500 mL

## 2022-09-12 MED ORDER — ONDANSETRON HCL 4 MG/2ML IJ SOLN: 4.0000 mg | Freq: Four times a day (QID) | INTRAMUSCULAR | Status: DC | PRN

## 2022-09-12 SURGICAL SUPPLY — 22 items
CATH 8FR REPROCESSED SOUNDSTAR (CATHETERS) IMPLANT
CATH 8FR SOUNDSTAR REPROCESSED (CATHETERS) IMPLANT
CATH ABLAT QDOT MICRO BI TC FJ (CATHETERS) IMPLANT
CATH OCTARAY 2.0 F 3-3-3-3-3 (CATHETERS) IMPLANT
CATH PIGTAIL STEERABLE D1 8.7 (WIRE) IMPLANT
CATH S-M CIRCA TEMP PROBE (CATHETERS) IMPLANT
CATH SOUNDSTAR ECO 8FR (CATHETERS) IMPLANT
CATH WEB BI DIR CSDF CRV REPRO (CATHETERS) IMPLANT
CLOSURE PERCLOSE PROSTYLE (VASCULAR PRODUCTS) IMPLANT
COVER SWIFTLINK CONNECTOR (BAG) ×1 IMPLANT
DEVICE CLOSURE MYNXGRIP 6/7F (Vascular Products) IMPLANT
MAT PREVALON FULL STRYKER (MISCELLANEOUS) IMPLANT
PACK EP LATEX FREE (CUSTOM PROCEDURE TRAY) ×1
PACK EP LF (CUSTOM PROCEDURE TRAY) ×1 IMPLANT
PAD DEFIB RADIO PHYSIO CONN (PAD) ×1 IMPLANT
PATCH CARTO3 (PAD) IMPLANT
SHEATH CARTO VIZIGO MED CURVE (SHEATH) IMPLANT
SHEATH CARTO VIZIGO SM CVD (SHEATH) IMPLANT
SHEATH PINNACLE 8F 10CM (SHEATH) IMPLANT
SHEATH PINNACLE 9F 10CM (SHEATH) IMPLANT
SHEATH PROBE COVER 6X72 (BAG) IMPLANT
TUBING SMART ABLATE COOLFLOW (TUBING) IMPLANT

## 2022-09-12 NOTE — Anesthesia Preprocedure Evaluation (Addendum)
Anesthesia Evaluation  Patient identified by MRN, date of birth, ID band Patient awake    Reviewed: Allergy & Precautions, NPO status , Patient's Chart, lab work & pertinent test results, reviewed documented beta blocker date and time   History of Anesthesia Complications Negative for: history of anesthetic complications  Airway Mallampati: II  TM Distance: >3 FB Neck ROM: Full    Dental  (+) Edentulous Upper, Edentulous Lower   Pulmonary neg pneumonia , neg COPD   breath sounds clear to auscultation       Cardiovascular Exercise Tolerance: Good hypertension, Pt. on medications (-) angina (-) CAD and (-) Past MI + dysrhythmias  Rhythm:Regular Rate:Normal     Neuro/Psych neg Headaches, neg Seizures    GI/Hepatic   Endo/Other    Renal/GU Renal disease     Musculoskeletal   Abdominal   Peds  Hematology   Anesthesia Other Findings   Reproductive/Obstetrics                             Anesthesia Physical Anesthesia Plan  ASA: 2  Anesthesia Plan: General   Post-op Pain Management:    Induction: Intravenous  PONV Risk Score and Plan: 1 and Ondansetron and Dexamethasone  Airway Management Planned: Oral ETT  Additional Equipment:   Intra-op Plan:   Post-operative Plan: Extubation in OR  Informed Consent:      Dental advisory given  Plan Discussed with:   Anesthesia Plan Comments:        Anesthesia Quick Evaluation

## 2022-09-12 NOTE — Anesthesia Postprocedure Evaluation (Signed)
Anesthesia Post Note  Patient: Parker Saunders  Procedure(s) Performed: ATRIAL FIBRILLATION ABLATION     Patient location during evaluation: PACU Anesthesia Type: General Level of consciousness: awake and alert Pain management: pain level controlled Vital Signs Assessment: post-procedure vital signs reviewed and stable Respiratory status: spontaneous breathing Cardiovascular status: blood pressure returned to baseline Postop Assessment: no headache Anesthetic complications: no   There were no known notable events for this encounter.  Last Vitals:  Vitals:   09/12/22 1140 09/12/22 1309  BP: (!) 142/85   Pulse: 78   Resp: 18   Temp: 36.7 C   SpO2: 97% 98%    Last Pain:  Vitals:   09/12/22 1151  TempSrc:   PainSc: 0-No pain                 Mariann Barter

## 2022-09-12 NOTE — H&P (Signed)
Electrophysiology Office Note:    Date:  09/12/2022   ID:  Parker Saunders, DOB [birth year], MRN [id number]  PCP:  Shade Flood, MD   [facility] Providers Cardiologist:  None Electrophysiologist:  Maurice Small, MD     Referring MD: No ref. provider found   History of Present Illness:    Parker Saunders is a 57 y.o. male with a hx listed below, significant for hypertension, hyperlipidemia, atrial fibrillation, referred for arrhythmia management.  He was initially diagnosed with atrial fibrillation in November 2023 during routine physical by his PCP.  He was started on Eliquis.  His Chads2Vasc score is 1.  DC cardioversion was attempted unsuccessfully on July 19, 2022.  Flecainide was started, and he had to sinus rhythm prior to follow-up on February 6.  He feels better in sinus rhythm, has more energy.  However, on his more recent follow-up in A-fib clinic on February 19, he had converted back to atrial fibrillation. Today, his ECG shows that he is back in sinus rhythm.  I reviewed the patient's CT and labs. There was no LAA thrombus. he  has not missed any doses of anticoagulation, and he took his dose last night. There have been no changes in the patient's diagnoses, medications, or condition since our recent clinic visit.     Past Medical History:  Diagnosis Date   Hypertension    Renal stone    Rotator cuff disorder     Past Surgical History:  Procedure Laterality Date   BACK SURGERY  2005   CARDIOVERSION N/A 02/17/2022   Procedure: CARDIOVERSION;  Surgeon: Chrystie Nose, MD;  Location: [facility] ENDOSCOPY;  Service: Cardiovascular;  Laterality: N/A;   LUMBAR LAMINECTOMY/DECOMPRESSION MICRODISCECTOMY Left 02/04/2016   Procedure: REDO MICRO LUMBAR DECOMPRESSION L4-5, L5-S1 ON THE LEFT  2 LEVELS;  Surgeon: [name], MD;  Location: [facility] ORS;  Service: Orthopedics;  Laterality: Left;   SHOULDER SURGERY Right 2014   SPINE SURGERY  02/07/2005   [facility]     Current Medications: Current Meds  Medication Sig   acetaminophen (TYLENOL) 500 MG tablet Take 500 mg by mouth every 6 (six) hours as needed for moderate pain or mild pain.   apixaban (ELIQUIS) 5 MG TABS tablet TAKE 1 TABLET(5 MG) BY MOUTH TWICE DAILY   atorvastatin (LIPITOR) 20 MG tablet Take 1 tablet (20 mg total) by mouth daily.   flecainide (TAMBOCOR) 100 MG tablet TAKE 1 TABLET(100 MG) BY MOUTH TWICE DAILY   losartan-hydrochlorothiazide (HYZAAR) 100-12.5 MG tablet TAKE 1 TABLET BY MOUTH DAILY   metoprolol succinate (TOPROL-XL) 50 MG 24 hr tablet Take 1 tablet (50 mg total) by mouth daily. Take with or immediately following a meal.     Allergies:   Patient has no known allergies.   Social and Family History: Reviewed in Epic  ROS:   Please see the history of present illness.    All other systems reviewed and are negative.  EKGs/Labs/Other Studies Reviewed Today:    Echocardiogram:  TTE 01/21/22 EF 60 to 65%, mild basal septal LVH.  LA was mildly dilated.   Monitors:   Stress testing:    Advanced imaging:   EKG:  Last EKG results: May a.m.-normal sinus rhythm, HR 61 bpm   Recent Labs: 12/14/2021: NT-Pro BNP 969; TSH 1.95 06/13/2022: ALT 32 08/22/2022: BUN 23; Creatinine, Ser 1.28; Hemoglobin 15.2; Platelets 325; Potassium 4.4; Sodium 142     Physical Exam:    VS:  BP (!) 142/85  Pulse 78   Temp 98 F (36.7 C) (Temporal)   Resp 18   Ht 6\' 3"  (1.905 m)   Wt 129.3 kg   SpO2 97%   BMI 35.62 kg/m     Wt Readings from Last 3 Encounters:  09/12/22 129.3 kg  06/13/22 131.2 kg  05/09/22 132.5 kg     GEN: Well nourished, well developed in no acute distress CARDIAC: RRR, no murmurs, rubs, gallops RESPIRATORY:  Normal work of breathing MUSCULOSKELETAL: no edema    ASSESSMENT & PLAN:    Persistent atrial fibrillation Having AF recurrence on flecainide. He symptomatic with fatigue  We discussed the indication, rationale, logistics, anticipated  benefits, and potential risks of the ablation procedure including but not limited to -- bleed at the groin access site, chest pain, damage to nearby organs such as the diaphragm, lungs, or esophagus, need for a drainage tube, or prolonged hospitalization. I explained that the risk for stroke, heart attack, need for open chest surgery, or even death is very low but not zero. he  expressed understanding and wishes to proceed.   Secondary hypercoagulable state CHADS2 vasc is 1 Continue apixaban  Hypertension Continue losartan-HCTZ 100-12 0.5, metoprolol XL 50 mg        Medication Adjustments/Labs and Tests Ordered: Current medicines are reviewed at length with the patient today.  Concerns regarding medicines are outlined above.  Orders Placed This Encounter  Procedures   Informed Consent Details: Physician/Practitioner Attestation; Transcribe to consent form and obtain patient signature   Initiate Pre-op Protocol   Void on call to EP Lab   Confirm CBC and BMP (or CMP) results within 7 days for inpatient and 30 days for outpatient:   Clip right and left femoral area PM before surgery   Clip right internal jugular area PM before surgery   Pre-admission testing diagnosis   EP STUDY   Insert peripheral IV   Meds ordered this encounter  Medications   0.9 %  sodium chloride infusion     Signed, Maurice Small, MD  09/12/2022 12:43 PM    [facility]

## 2022-09-12 NOTE — Transfer of Care (Signed)
Immediate Anesthesia Transfer of Care Note  Patient: Parker Saunders  Procedure(s) Performed: ATRIAL FIBRILLATION ABLATION  Patient Location: PACU  Anesthesia Type:General  Level of Consciousness: awake and alert   Airway & Oxygen Therapy: Patient Spontanous Breathing  Post-op Assessment: Report given to RN and Post -op Vital signs reviewed and stable  Post vital signs: Reviewed  Last Vitals:  Vitals Value Taken Time  BP    Temp    Pulse    Resp    SpO2      Last Pain:  Vitals:   09/12/22 1151  TempSrc:   PainSc: 0-No pain         Complications: There were no known notable events for this encounter.

## 2022-09-12 NOTE — Discharge Instructions (Signed)

## 2022-09-12 NOTE — Progress Notes (Signed)
Pt ambulated to and from bathroom with no signs of oozing from bilateral groin sites 

## 2022-09-12 NOTE — Anesthesia Procedure Notes (Signed)
Procedure Name: Intubation Date/Time: 09/12/2022 1:10 PM  Performed by: Mariann Barter, MDPre-anesthesia Checklist: Patient identified, Emergency Drugs available, Suction available and Patient being monitored Patient Re-evaluated:Patient Re-evaluated prior to induction Oxygen Delivery Method: Circle System Utilized Preoxygenation: Pre-oxygenation with 100% oxygen Induction Type: IV induction Ventilation: Mask ventilation without difficulty Laryngoscope Size: Miller and 3 Grade View: Grade I Tube type: Oral Tube size: 7.5 mm Number of attempts: 1 Airway Equipment and Method: Stylet and Oral airway Placement Confirmation: ETT inserted through vocal cords under direct vision, positive ETCO2 and breath sounds checked- equal and bilateral Tube secured with: Tape Dental Injury: Teeth and Oropharynx as per pre-operative assessment

## 2022-09-13 ENCOUNTER — Encounter (HOSPITAL_COMMUNITY): Payer: Self-pay | Admitting: Cardiovascular Disease

## 2022-09-14 ENCOUNTER — Other Ambulatory Visit (HOSPITAL_COMMUNITY): Payer: Self-pay

## 2022-10-02 ENCOUNTER — Other Ambulatory Visit: Payer: Self-pay | Admitting: Family Medicine

## 2022-10-02 DIAGNOSIS — E785 Hyperlipidemia, unspecified: Secondary | ICD-10-CM

## 2022-10-09 ENCOUNTER — Other Ambulatory Visit: Payer: Self-pay | Admitting: Family Medicine

## 2022-10-09 DIAGNOSIS — I10 Essential (primary) hypertension: Secondary | ICD-10-CM

## 2022-10-11 ENCOUNTER — Encounter (HOSPITAL_COMMUNITY): Payer: Self-pay | Admitting: Physician Assistant

## 2022-10-11 ENCOUNTER — Ambulatory Visit (HOSPITAL_COMMUNITY)
Admission: RE | Admit: 2022-10-11 | Discharge: 2022-10-11 | Disposition: A | Discharge status: Home / Self Care | Payer: Medicare HMO | Source: Ambulatory Visit | Attending: Physician Assistant | Admitting: Physician Assistant

## 2022-10-11 VITALS — BP 118/82 | HR 68 | Ht 75.0 in | Wt 287.2 lb

## 2022-10-11 DIAGNOSIS — Z7901 Long term (current) use of anticoagulants: Secondary | ICD-10-CM | POA: Insufficient documentation

## 2022-10-11 DIAGNOSIS — Z5181 Encounter for therapeutic drug level monitoring: Secondary | ICD-10-CM

## 2022-10-11 DIAGNOSIS — Z79899 Other long term (current) drug therapy: Secondary | ICD-10-CM | POA: Diagnosis not present

## 2022-10-11 DIAGNOSIS — E669 Obesity, unspecified: Secondary | ICD-10-CM | POA: Insufficient documentation

## 2022-10-11 DIAGNOSIS — Z6835 Body mass index (BMI) 35.0-35.9, adult: Secondary | ICD-10-CM | POA: Insufficient documentation

## 2022-10-11 DIAGNOSIS — I10 Essential (primary) hypertension: Secondary | ICD-10-CM | POA: Insufficient documentation

## 2022-10-11 DIAGNOSIS — D6869 Other thrombophilia: Secondary | ICD-10-CM | POA: Diagnosis not present

## 2022-10-11 DIAGNOSIS — G4733 Obstructive sleep apnea (adult) (pediatric): Secondary | ICD-10-CM | POA: Insufficient documentation

## 2022-10-11 DIAGNOSIS — I4819 Other persistent atrial fibrillation: Secondary | ICD-10-CM | POA: Diagnosis not present

## 2022-10-11 DIAGNOSIS — I4891 Unspecified atrial fibrillation: Secondary | ICD-10-CM | POA: Diagnosis present

## 2022-10-11 DIAGNOSIS — I2510 Atherosclerotic heart disease of native coronary artery without angina pectoris: Secondary | ICD-10-CM | POA: Insufficient documentation

## 2022-10-11 MED ORDER — APIXABAN 5 MG PO TABS: 5.0000 mg | ORAL_TABLET | Freq: Two times a day (BID) | ORAL | 5 refills | Status: DC

## 2022-10-11 MED ORDER — FLECAINIDE ACETATE 100 MG PO TABS: ORAL_TABLET | ORAL | 6 refills | Status: DC

## 2022-10-11 NOTE — Progress Notes (Signed)
Primary Care Physician: Shade Flood, MD Primary Cardiologist: Dr Tresa Endo Primary Electrophysiologist: Dr Nelly Laurence  Referring Physician: Dr Tresa Endo   Parker Saunders is a 57 y.o. male with a history of HTN, HLD, CAD, atrial fibrillation who presents for follow up in the [facility].  The patient was initially diagnosed with atrial fibrillation 12/2021 at a routine physical with his PCP. An irregular heart beat was noted on auscultation and and ECG showed afib. He was referred to cardiology and seen by Dr Tresa Endo who started Eliquis for a CHADS2VASC score of 1 and metoprolol for rate control. Patient does admit to snoring. No significant alcohol use.   Patient is s/p DCCV on 02/17/22 which was unsuccessful after 3 shocks. He was started on flecainide and chemically converted to SR, DCCV on 03/15/22 was cancelled.   On follow up today, patient is s/p afib ablation with Dr Nelly Laurence on 09/12/22. He reports that he has done well since the ablation. He remains in SR. He denies swallowing pain or groin issues. He has had some brief, mild chest discomfort but his is improving.   Today, he denies symptoms of palpitations, orthopnea, PND, lower extremity edema, dizziness, presyncope, syncope, snoring, daytime somnolence, bleeding, or neurologic sequela. The patient is tolerating medications without difficulties and is otherwise without complaint today.    Atrial Fibrillation Risk Factors:  he does have symptoms or diagnosis of sleep apnea. he is not currently on CPAP he does not have a history of rheumatic fever. he does not have a history of alcohol use. The patient does not have a history of early familial atrial fibrillation or other arrhythmias.   Atrial Fibrillation Management history:  Previous antiarrhythmic drugs: flecainide  Previous cardioversions: 02/17/22 Previous ablations: 09/12/22 Anticoagulation history: Eliquis   Past Medical History:  Diagnosis Date    Hypertension    Renal stone    Rotator cuff disorder     Current Outpatient Medications  Medication Sig Dispense Refill   acetaminophen (TYLENOL) 500 MG tablet Take 500 mg by mouth every 6 (six) hours as needed for moderate pain or mild pain.     atorvastatin (LIPITOR) 20 MG tablet TAKE 1 TABLET(20 MG) BY MOUTH DAILY 90 tablet 2   losartan-hydrochlorothiazide (HYZAAR) 100-12.5 MG tablet TAKE 1 TABLET BY MOUTH DAILY 90 tablet 2   metoprolol succinate (TOPROL-XL) 50 MG 24 hr tablet Take 1 tablet (50 mg total) by mouth daily. Take with or immediately following a meal. 90 tablet 3   apixaban (ELIQUIS) 5 MG TABS tablet Take 1 tablet (5 mg total) by mouth 2 (two) times daily. 60 tablet 5   flecainide (TAMBOCOR) 100 MG tablet TAKE 1 TABLET(100 MG) BY MOUTH TWICE DAILY 60 tablet 6   No current facility-administered medications for this encounter.    ROS- All systems are reviewed and negative except as per the HPI above.  Physical Exam: Vitals:   10/11/22 1021  BP: 118/82  Pulse: 68  Weight: 130.3 kg  Height: 6\' 3"  (1.905 m)    GEN: Well nourished, well developed in no acute distress NECK: No JVD; No carotid bruits CARDIAC: Regular rate and rhythm, no murmurs, rubs, gallops RESPIRATORY:  Clear to auscultation without rales, wheezing or rhonchi  ABDOMEN: Soft, non-tender, non-distended EXTREMITIES:  No edema; No deformity   Wt Readings from Last 3 Encounters:  10/11/22 130.3 kg  09/12/22 129.3 kg  06/13/22 131.2 kg    EKG today demonstrates  SR Vent. rate 68 BPM PR  interval 162 ms QRS duration 96 ms QT/QTcB 398/423 ms  Echo 01/21/22 demonstrated   1. Left ventricular ejection fraction, by estimation, is 60 to 65%. The  left ventricle has normal function. The left ventricle has no regional  wall motion abnormalities. There is mild left ventricular hypertrophy of  the basal-septal segment. Left ventricular diastolic function could not be evaluated.   2. Right ventricular  systolic function is normal. The right ventricular  size is normal. There is normal pulmonary artery systolic pressure. The  estimated right ventricular systolic pressure is 29.4 mmHg.   3. Left atrial size was mildly dilated.   4. The mitral valve is normal in structure. Trivial mitral valve  regurgitation. No evidence of mitral stenosis.   5. The aortic valve is normal in structure. Aortic valve regurgitation is  not visualized. No aortic stenosis is present.   6. Aortic dilatation noted. There is borderline dilatation of the aortic  root, measuring 37 mm.   7. The inferior vena cava is normal in size with greater than 50%  respiratory variability, suggesting right atrial pressure of 3 mmHg.   Epic records are reviewed at length today  CHA2DS2-VASc Score = 2  The patient's score is based upon: CHF History: 0 HTN History: 1 Diabetes History: 0 Stroke History: 0 Vascular Disease History: 1 (CAD on CT) Age Score: 0 Gender Score: 0       ASSESSMENT AND PLAN: Persistent Atrial Fibrillation (ICD10:  I48.19) The patient's CHA2DS2-VASc score is 2, indicating a 2.2% annual risk of stroke.   S/p  afib ablation 09/12/22 Patient appears to be maintaining SR Continue flecainide 100 mg BID for now. Continue Eliquis 5 mg BID with no missed doses for 3 months post ablation. Continue Toprol 50 mg daily  Secondary Hypercoagulable State (ICD10:  D68.69) The patient is at significant risk for stroke/thromboembolism based upon his CHA2DS2-VASc Score of 2.  Continue Apixaban (Eliquis).    HTN Stable on current regimen  Obesity Body mass index is 35.9 kg/m.  Encouraged lifestyle modification  OSA  The importance of adequate treatment of sleep apnea was discussed today in order to improve our ability to maintain sinus rhythm long term. Patient has not gotten CPAP yet, he is concerned about mask tolerance.   CAD CAC score 109 on CT No anginal symptoms  On statin   Follow up with Dr  Nelly Laurence as scheduled.    Jorja Loa PA-C [facility] [address] [place], [place] [address] [phone] 10/11/2022 10:58 AM

## 2022-12-09 ENCOUNTER — Other Ambulatory Visit: Payer: Self-pay | Admitting: Cardiovascular Disease

## 2022-12-12 ENCOUNTER — Encounter: Payer: Self-pay | Admitting: Family Medicine

## 2022-12-12 ENCOUNTER — Ambulatory Visit (INDEPENDENT_AMBULATORY_CARE_PROVIDER_SITE_OTHER): Payer: Medicare HMO | Admitting: Family Medicine

## 2022-12-12 VITALS — BP 128/80 | HR 76 | Temp 98.5°F | Ht 75.0 in | Wt 279.6 lb

## 2022-12-12 DIAGNOSIS — I4891 Unspecified atrial fibrillation: Secondary | ICD-10-CM | POA: Diagnosis not present

## 2022-12-12 DIAGNOSIS — E785 Hyperlipidemia, unspecified: Secondary | ICD-10-CM | POA: Diagnosis not present

## 2022-12-12 DIAGNOSIS — Z131 Encounter for screening for diabetes mellitus: Secondary | ICD-10-CM | POA: Diagnosis not present

## 2022-12-12 DIAGNOSIS — Z0000 Encounter for general adult medical examination without abnormal findings: Secondary | ICD-10-CM

## 2022-12-12 DIAGNOSIS — I10 Essential (primary) hypertension: Secondary | ICD-10-CM | POA: Diagnosis not present

## 2022-12-12 NOTE — Patient Instructions (Signed)
No med changes at this time.  Glad you are doing well.  Keep follow-up with cardiac specialist as planned.  Recheck with me in 6 months but let me know if there are questions in the meantime.  Take care!  Preventive Care 89-58 Years Old, Male Preventive care refers to lifestyle choices and visits with your health care provider that can promote health and wellness. Preventive care visits are also called wellness exams. What can I expect for my preventive care visit? Counseling During your preventive care visit, your health care provider may ask about your: Medical history, including: Past medical problems. Family medical history. Current health, including: Emotional well-being. Home life and relationship well-being. Sexual activity. Lifestyle, including: Alcohol, nicotine or tobacco, and drug use. Access to firearms. Diet, exercise, and sleep habits. Safety issues such as seatbelt and bike helmet use. Sunscreen use. Work and work Astronomer. Physical exam Your health care provider will check your: Height and weight. These may be used to calculate your BMI (body mass index). BMI is a measurement that tells if you are at a healthy weight. Waist circumference. This measures the distance around your waistline. This measurement also tells if you are at a healthy weight and may help predict your risk of certain diseases, such as type 2 diabetes and high blood pressure. Heart rate and blood pressure. Body temperature. Skin for abnormal spots. What immunizations do I need?  Vaccines are usually given at various ages, according to a schedule. Your health care provider will recommend vaccines for you based on your age, medical history, and lifestyle or other factors, such as travel or where you work. What tests do I need? Screening Your health care provider may recommend screening tests for certain conditions. This may include: Lipid and cholesterol levels. Diabetes screening. This is done  by checking your blood sugar (glucose) after you have not eaten for a while (fasting). Hepatitis B test. Hepatitis C test. HIV (human immunodeficiency virus) test. STI (sexually transmitted infection) testing, if you are at risk. Lung cancer screening. Prostate cancer screening. Colorectal cancer screening. Talk with your health care provider about your test results, treatment options, and if necessary, the need for more tests. Follow these instructions at home: Eating and drinking  Eat a diet that includes fresh fruits and vegetables, whole grains, lean protein, and low-fat dairy products. Take vitamin and mineral supplements as recommended by your health care provider. Do not drink alcohol if your health care provider tells you not to drink. If you drink alcohol: Limit how much you have to 0-2 drinks a day. Know how much alcohol is in your drink. In the [place] one drink equals one 12 oz bottle of beer (355 mL), one 5 oz glass of wine (148 mL), or one 1 oz glass of hard liquor (44 mL). Lifestyle Brush your teeth every morning and night with fluoride toothpaste. Floss one time each day. Exercise for at least 30 minutes 5 or more days each week. Do not use any products that contain nicotine or tobacco. These products include cigarettes, chewing tobacco, and vaping devices, such as e-cigarettes. If you need help quitting, ask your health care provider. Do not use drugs. If you are sexually active, practice safe sex. Use a condom or other form of protection to prevent STIs. Take aspirin only as told by your health care provider. Make sure that you understand how much to take and what form to take. Work with your health care provider to find out whether it is  safe and beneficial for you to take aspirin daily. Find healthy ways to manage stress, such as: Meditation, yoga, or listening to music. Journaling. Talking to a trusted person. Spending time with friends and family. Minimize exposure  to UV radiation to reduce your risk of skin cancer. Safety Always wear your seat belt while driving or riding in a vehicle. Do not drive: If you have been drinking alcohol. Do not ride with someone who has been drinking. When you are tired or distracted. While texting. If you have been using any mind-altering substances or drugs. Wear a helmet and other protective equipment during sports activities. If you have firearms in your house, make sure you follow all gun safety procedures. What's next? Go to your health care provider once a year for an annual wellness visit. Ask your health care provider how often you should have your eyes and teeth checked. Stay up to date on all vaccines. This information is not intended to replace advice given to you by your health care provider. Make sure you discuss any questions you have with your health care provider. Document Revised: 07/22/2020 Document Reviewed: 07/22/2020 Elsevier Patient Education  2024 [employer].

## 2022-12-12 NOTE — Progress Notes (Unsigned)
Subjective:  Patient ID: Parker Saunders, male    DOB: 1966/01/29  Age: 57 y.o. MRN: [id number]  CC:  Chief Complaint  Patient presents with   Medical Management of Chronic Issues    Pt is doing well, no concerns     HPI Parker Saunders presents for Annual Exam PCP - me,  Cardiology, EP -Dr. Nelly Laurence   Hypertension: With history of atrial fibrillation diagnosed in November of last year, OSA.  Hypertension treated losartan/HCTZ 100/12.5 mg daily, rate control with Toprol-XL 50 mg daily, flecainide 100 mg daily for rhythm control and Eliquis 5 mg twice daily for anticoagulation.  Mild OSA on previous sleep testing.  No central apneas.  Option of treatment given other conditions.  Decided against treatment. Sleeping well. Has had some recurrence of A-fib on flecainide.  ablation performed August visit.  Cardiology follow-up at [facility] September 3 noted.  Remained in sinus rhythm at that time since ablation. No recent palpitations.  CP has tapered off.  Home readings: 120/70-80 range.  No new bleeding.  Dr. Nelly Laurence appt on 11/21.    BP Readings from Last 3 Encounters:  12/12/22 128/80  10/11/22 118/82  09/12/22 110/71   Lab Results  Component Value Date   CREATININE 1.28 (H) 08/22/2022   Hyperlipidemia: Lipitor 20 mg daily, no new myalgias/side effects.   Lab Results  Component Value Date   CHOL 136 06/13/2022   HDL 30.50 (L) 06/13/2022   LDLCALC 80 12/14/2021   LDLDIRECT 88.0 06/13/2022   TRIG 212.0 (H) 06/13/2022   CHOLHDL 4 06/13/2022   Lab Results  Component Value Date   ALT 32 06/13/2022   AST 22 06/13/2022   ALKPHOS 78 06/13/2022   BILITOT 0.5 06/13/2022        12/12/2022    3:14 PM 07/28/2022    9:40 AM 06/13/2022    2:42 PM 12/13/2021    2:43 PM 07/15/2021    4:11 PM  Depression screen PHQ 2/9  Decreased Interest 0 0 0 0 0  Down, Depressed, Hopeless 0 0 0 0 0  PHQ - 2 Score 0 0 0 0 0  Altered sleeping 0 1 0 0   Tired, decreased energy 0 1 0 0    Change in appetite 0 0 0 0   Feeling bad or failure about yourself  0 0 0 0   Trouble concentrating 0 0 0 0   Moving slowly or fidgety/restless 0 0 0 0   Suicidal thoughts 0 0 0 0   PHQ-9 Score 0 2 0 0     Health Maintenance  Topic Date Due   COVID-19 Vaccine (1) Never done   Medicare Annual Wellness (AWV)  07/28/2023   Fecal DNA (Cologuard)  04/21/2024   DTaP/Tdap/Td (2 - Td or Tdap) 01/17/2028   Hepatitis C Screening  Completed   HIV Screening  Completed   HPV VACCINES  Aged Out   INFLUENZA VACCINE  Discontinued   Zoster Vaccines- Shingrix  Discontinued  Cologuard: 04/21/21 Prostate: does not have family history of prostate cancer The natural history of prostate cancer and ongoing controversy regarding screening and potential treatment outcomes of prostate cancer has been discussed with the patient. The meaning of a false positive PSA and a false negative PSA has been discussed. He indicates understanding of the limitations of this screening test and wishes NOT to proceed with screening PSA testing. No results found for: "PSA1", "PSA"   Immunization History  Administered Date(s) Administered   Influenza,inj,Quad  PF,6+ Mos 12/04/2020   Tdap 01/16/2018  Flu vaccine - declines.  Declines covid vaccines.  Shingles - declines.   No results found. Wears glasses - optho   Dental: edentuous. Declines dentures - tried in past - did not fit right. No difficulty with eating at this time. Soft foods.   Alcohol:none  Tobacco: none  Exercise: walking few times per day. Losing weight.  Wt Readings from Last 3 Encounters:  12/12/22 279 lb 9.6 oz (126.8 kg)  10/11/22 287 lb 3.2 oz (130.3 kg)  09/12/22 285 lb (129.3 kg)   History Patient Active Problem List   Diagnosis Date Noted   Hypercoagulable state due to persistent atrial fibrillation (HCC) 10/11/2022   Persistent atrial fibrillation (HCC) 01/25/2022   Spinal stenosis of lumbar region 02/04/2016   Essential  hypertension 01/25/2013   Past Medical History:  Diagnosis Date   Hypertension    Renal stone    Rotator cuff disorder    Past Surgical History:  Procedure Laterality Date   ATRIAL FIBRILLATION ABLATION N/A 09/12/2022   Procedure: ATRIAL FIBRILLATION ABLATION;  Surgeon: Maurice Small, MD;  Location: [facility] INVASIVE CV LAB;  Service: Cardiovascular;  Laterality: N/A;   BACK SURGERY  2005   CARDIOVERSION N/A 02/17/2022   Procedure: CARDIOVERSION;  Surgeon: Chrystie Nose, MD;  Location: [facility] ENDOSCOPY;  Service: Cardiovascular;  Laterality: N/A;   LUMBAR LAMINECTOMY/DECOMPRESSION MICRODISCECTOMY Left 02/04/2016   Procedure: REDO MICRO LUMBAR DECOMPRESSION L4-5, L5-S1 ON THE LEFT  2 LEVELS;  Surgeon: [name], MD;  Location: [facility] ORS;  Service: Orthopedics;  Laterality: Left;   SHOULDER SURGERY Right 2014   SPINE SURGERY  02/07/2005   [facility]   No Known Allergies Prior to Admission medications   Medication Sig Start Date End Date Taking? Authorizing Provider  acetaminophen (TYLENOL) 500 MG tablet Take 500 mg by mouth every 6 (six) hours as needed for moderate pain or mild pain.   Yes [provider]  apixaban (ELIQUIS) 5 MG TABS tablet Take 1 tablet (5 mg total) by mouth 2 (two) times daily. 10/11/22  Yes Fenton, Clint R, PA  atorvastatin (LIPITOR) 20 MG tablet TAKE 1 TABLET(20 MG) BY MOUTH DAILY 10/03/22  Yes Shade Flood, MD  flecainide (TAMBOCOR) 100 MG tablet TAKE 1 TABLET(100 MG) BY MOUTH TWICE DAILY 10/11/22  Yes Fenton, Clint R, PA  losartan-hydrochlorothiazide (HYZAAR) 100-12.5 MG tablet TAKE 1 TABLET BY MOUTH DAILY 10/10/22  Yes Shade Flood, MD  metoprolol succinate (TOPROL-XL) 50 MG 24 hr tablet TAKE 1 TABLET(50 MG) BY MOUTH DAILY WITH OR IMMEDIATELY FOLLOWING A MEAL 12/09/22  Yes Lennette Bihari, MD   Social History   Socioeconomic History   Marital status: Married    Spouse name: Not on file   Number of children: 2   Years of education: Not on file    Highest education level: 12th grade  Occupational History   Occupation: unemployed    Comment: since 2015  Tobacco Use   Smoking status: Never   Smokeless tobacco: Never   Tobacco comments:    Never smoke 02/24/22  Substance and Sexual Activity   Alcohol use: No   Drug use: No   Sexual activity: Yes  Other Topics Concern   Not on file  Social History Narrative   Marital status: married      Children: 2 daughters      Lives: with wife, 2 daughters      Employment: unemployed after termination in 2015  Tobacco: none      Alcohol: none      Exercise:  Walking 4-5 miles daily   Social Determinants of Health   Financial Resource Strain: Low Risk  (07/28/2022)   Overall Financial Resource Strain (CARDIA)    Difficulty of Paying Living Expenses: Not hard at all  Food Insecurity: No Food Insecurity (07/28/2022)   Hunger Vital Sign    Worried About Running Out of Food in the Last Year: Never true    Ran Out of Food in the Last Year: Never true  Transportation Needs: No Transportation Needs (07/28/2022)   PRAPARE - Administrator, Civil Service (Medical): No    Lack of Transportation (Non-Medical): No  Physical Activity: Insufficiently Active (07/28/2022)   Exercise Vital Sign    Days of Exercise per Week: 3 days    Minutes of Exercise per Session: 30 min  Stress: No Stress Concern Present (07/28/2022)   [employer] of Occupational Health - Occupational Stress Questionnaire    Feeling of Stress : Not at all  Social Connections: Moderately Isolated (07/28/2022)   Social Connection and Isolation Panel [NHANES]    Frequency of Communication with Friends and Family: Once a week    Frequency of Social Gatherings with Friends and Family: Never    Attends Religious Services: More than 4 times per year    Active Member of [employer] or Organizations: No    Attends Banker Meetings: Never    Marital Status: Married  Catering manager Violence: Not At Risk  (07/15/2021)   Humiliation, Afraid, Rape, and Kick questionnaire    Fear of Current or Ex-Partner: No    Emotionally Abused: No    Physically Abused: No    Sexually Abused: No    Review of Systems 13 point review of systems per patient health survey noted.  Negative other than as indicated above or in HPI.    Objective:   Vitals:   12/12/22 1517  BP: 128/80  Pulse: 76  Temp: 98.5 F (36.9 C)  TempSrc: Temporal  SpO2: 97%  Weight: 279 lb 9.6 oz (126.8 kg)  Height: 6\' 3"  (1.905 m)   {Vitals History (Optional):23777}  Physical Exam Vitals reviewed.  Constitutional:      Appearance: He is well-developed.  HENT:     Head: Normocephalic and atraumatic.     Right Ear: External ear normal.     Left Ear: External ear normal.     Ears:     Comments: Moderate nonobstructive cerumen on left, minimal on right.    Mouth/Throat:     Comments: Edentulous, no oral ulcerations, gum tissue appears healthy. Eyes:     Conjunctiva/sclera: Conjunctivae normal.     Pupils: Pupils are equal, round, and reactive to light.  Neck:     Thyroid: No thyromegaly.  Cardiovascular:     Rate and Rhythm: Normal rate and regular rhythm.     Heart sounds: Normal heart sounds.  Pulmonary:     Effort: Pulmonary effort is normal. No respiratory distress.     Breath sounds: Normal breath sounds. No wheezing.  Abdominal:     General: There is no distension.     Palpations: Abdomen is soft.     Tenderness: There is no abdominal tenderness.  Musculoskeletal:        General: No tenderness. Normal range of motion.     Cervical back: Normal range of motion and neck supple.  Lymphadenopathy:     Cervical: No cervical adenopathy.  Skin:    General: Skin is warm and dry.  Neurological:     Mental Status: He is alert and oriented to person, place, and time.     Deep Tendon Reflexes: Reflexes are normal and symmetric.  Psychiatric:        Behavior: Behavior normal.        Assessment & Plan:  Parker Saunders is a 57 y.o. male . Annual physical exam  Essential hypertension  Screening for diabetes mellitus  Hyperlipidemia, unspecified hyperlipidemia type  Screening for prostate cancer   No orders of the defined types were placed in this encounter.  There are no Patient Instructions on file for this visit.    Signed,   Meredith Staggers, MD  Primary Care, [facility] 12/12/22 4:13 PM

## 2022-12-13 ENCOUNTER — Encounter: Payer: Self-pay | Admitting: Family Medicine

## 2022-12-13 LAB — COMPREHENSIVE METABOLIC PANEL
ALT: 21 U/L (ref 0–53)
AST: 21 U/L (ref 0–37)
Albumin: 4.3 g/dL (ref 3.5–5.2)
Alkaline Phosphatase: 90 U/L (ref 39–117)
BUN: 16 mg/dL (ref 6–23)
CO2: 29 meq/L (ref 19–32)
Calcium: 9.8 mg/dL (ref 8.4–10.5)
Chloride: 103 meq/L (ref 96–112)
Creatinine, Ser: 1.05 mg/dL (ref 0.40–1.50)
GFR: 78.28 mL/min (ref >60.00)
Glucose, Bld: 79 mg/dL (ref 70–99)
Potassium: 4.1 meq/L (ref 3.5–5.1)
Sodium: 140 meq/L (ref 135–145)
Total Bilirubin: 0.4 mg/dL (ref 0.2–1.2)
Total Protein: 7.8 g/dL (ref 6.0–8.3)

## 2022-12-13 LAB — LIPID PANEL
Cholesterol: 140 mg/dL (ref 0–200)
HDL: 33.1 mg/dL — ABNORMAL LOW (ref >39.00)
LDL Cholesterol: 76 mg/dL (ref 0–99)
NonHDL: 107.26
Total CHOL/HDL Ratio: 4
Triglycerides: 158 mg/dL — ABNORMAL HIGH (ref 0.0–149.0)
VLDL: 31.6 mg/dL (ref 0.0–40.0)

## 2022-12-13 LAB — CBC WITH DIFFERENTIAL/PLATELET
Basophils Absolute: 0.1 10*3/uL (ref 0.0–0.1)
Basophils Relative: 0.7 % (ref 0.0–3.0)
Eosinophils Absolute: 0.3 10*3/uL (ref 0.0–0.7)
Eosinophils Relative: 3.7 % (ref 0.0–5.0)
HCT: 41.8 % (ref 39.0–52.0)
Hemoglobin: 13.7 g/dL (ref 13.0–17.0)
Lymphocytes Relative: 16.9 % (ref 12.0–46.0)
Lymphs Abs: 1.5 10*3/uL (ref 0.7–4.0)
MCHC: 32.8 g/dL (ref 30.0–36.0)
MCV: 86.9 fL (ref 78.0–100.0)
Monocytes Absolute: 0.9 10*3/uL (ref 0.1–1.0)
Monocytes Relative: 9.9 % (ref 3.0–12.0)
Neutro Abs: 6.2 10*3/uL (ref 1.4–7.7)
Neutrophils Relative %: 68.8 % (ref 43.0–77.0)
Platelets: 322 10*3/uL (ref 150.0–400.0)
RBC: 4.81 Mil/uL (ref 4.22–5.81)
RDW: 13.3 % (ref 11.5–15.5)
WBC: 8.9 10*3/uL (ref 4.0–10.5)

## 2022-12-13 LAB — HEMOGLOBIN A1C: Hgb A1c MFr Bld: 5.5 % (ref 4.6–6.5)

## 2022-12-29 ENCOUNTER — Ambulatory Visit: Payer: Medicare HMO | Attending: Cardiovascular Disease | Admitting: Cardiovascular Disease

## 2022-12-29 ENCOUNTER — Encounter: Payer: Self-pay | Admitting: Cardiovascular Disease

## 2022-12-29 VITALS — BP 118/78 | HR 63 | Ht 75.0 in | Wt 282.2 lb

## 2022-12-29 DIAGNOSIS — I4819 Other persistent atrial fibrillation: Secondary | ICD-10-CM

## 2022-12-29 MED ORDER — METOPROLOL SUCCINATE ER 25 MG PO TB24: 25.0000 mg | ORAL_TABLET | Freq: Every day | ORAL | 3 refills | Status: DC

## 2022-12-29 NOTE — Addendum Note (Signed)
Addended by: Sherle Poe R on: 12/29/2022 02:38 PM   Modules accepted: Orders

## 2022-12-29 NOTE — Patient Instructions (Signed)
Medication Instructions:  STOP Flecainide DECREASE Metoprolol Succinate to 25 mg once daily  *If you need a refill on your cardiac medications before your next appointment, please call your pharmacy*   Follow-Up: At [facility], you and your health needs are our priority.  As part of our continuing mission to provide you with exceptional heart care, we have created designated Provider Care Teams.  These Care Teams include your primary Cardiologist (physician) and Advanced Practice Providers (APPs -  Physician Assistants and Nurse Practitioners) who all work together to provide you with the care you need, when you need it.  We recommend signing up for the patient portal called "MyChart".  Sign up information is provided on this After Visit Summary.  MyChart is used to connect with patients for Virtual Visits (Telemedicine).  Patients are able to view lab/test results, encounter notes, upcoming appointments, etc.  Non-urgent messages can be sent to your provider as well.   To learn more about what you can do with MyChart, go to [email or web address].    Your next appointment:   6 month(s)  Provider:   You will follow up in the Atrial Fibrillation Clinic located at [facility]. Your provider will be: Clint R. Fenton, PA-C or Lake Bells, PA-C

## 2022-12-29 NOTE — Progress Notes (Signed)
  Electrophysiology Office Note:    Date:  12/29/2022   ID:  Parker Saunders, DOB June 04, 1965, MRN [id number]  PCP:  Shade Flood, MD   [facility] Providers Cardiologist:  None Electrophysiologist:  Maurice Small, MD     Referring MD: Shade Flood, MD   History of Present Illness:    Parker Saunders is a 57 y.o. male with a hx listed below, significant for hypertension, hyperlipidemia, atrial fibrillation, referred for arrhythmia management.  He was initially diagnosed with atrial fibrillation in November 2023 during routine physical by his PCP.  He was started on Eliquis.  His Chads2Vasc score is 1.  DC cardioversion was attempted unsuccessfully on July 19, 2022.  Flecainide was started, and he had to sinus rhythm prior to follow-up on February 6.  He feels better in sinus rhythm, has more energy.  However, on his more recent follow-up in A-fib clinic on February 19, he had converted back to atrial fibrillation.   He underwent successful ablation for atrial fibrillation on September 13, 2022.  He reports that since the ablation he has not had any symptoms to suggest recurrence of arrhythmia.     EKGs/Labs/Other Studies Reviewed Today:    Echocardiogram:  TTE 01/21/22 EF 60 to 65%, mild basal septal LVH.  LA was mildly dilated.   Monitors:   Stress testing:    Advanced imaging:   EKG:  Last EKG results: May a.m.-normal sinus rhythm, HR 61 bpm   Recent Labs: 12/12/2022: ALT 21; BUN 16; Creatinine, Ser 1.05; Hemoglobin 13.7; Platelets 322.0; Potassium 4.1; Sodium 140     Physical Exam:    VS:  BP 118/78 (BP Location: Left Arm, Patient Position: Sitting, Cuff Size: Large)   Pulse 63   Ht 6\' 3"  (1.905 m)   Wt 282 lb 3.2 oz (128 kg)   SpO2 96%   BMI 35.27 kg/m     Wt Readings from Last 3 Encounters:  12/29/22 282 lb 3.2 oz (128 kg)  12/12/22 279 lb 9.6 oz (126.8 kg)  10/11/22 287 lb 3.2 oz (130.3 kg)     GEN: Well nourished, well  developed in no acute distress CARDIAC: RRR, no murmurs, rubs, gallops RESPIRATORY:  Normal work of breathing MUSCULOSKELETAL: no edema    ASSESSMENT & PLAN:    Persistent atrial fibrillation Having AF recurrence on flecainide prior to ablation He symptomatic with fatigue in AF Status post ablation on September 13, 2022 with radiofrequency. Maintaining sinus rhythm since ablation Will DC flecainide today Decrease metoprolol XL to 25mg  daily   Secondary hypercoagulable state CHADS2 vasc is 1 Continue apixaban 5mg  PO BID  Hypertension Continue losartan-HCTZ 100-12 0.5, metoprolol XL (reduced to 25 mg)        Medication Adjustments/Labs and Tests Ordered: Current medicines are reviewed at length with the patient today.  Concerns regarding medicines are outlined above.  Orders Placed This Encounter  Procedures   EKG 12-Lead   No orders of the defined types were placed in this encounter.    Signed, Maurice Small, MD  12/29/2022 2:25 PM     [facility]

## 2023-04-10 ENCOUNTER — Encounter: Payer: Self-pay | Admitting: Family Medicine

## 2023-04-10 ENCOUNTER — Ambulatory Visit (INDEPENDENT_AMBULATORY_CARE_PROVIDER_SITE_OTHER): Payer: Medicare HMO | Admitting: Family Medicine

## 2023-04-10 VITALS — BP 138/78 | HR 88 | Temp 98.1°F | Ht 75.0 in | Wt 279.1 lb

## 2023-04-10 DIAGNOSIS — R21 Rash and other nonspecific skin eruption: Secondary | ICD-10-CM

## 2023-04-10 MED ORDER — CLOTRIMAZOLE-BETAMETHASONE 1-0.05 % EX CREA: 1.0000 | TOPICAL_CREAM | Freq: Two times a day (BID) | CUTANEOUS | 1 refills | Status: DC

## 2023-04-10 NOTE — Patient Instructions (Signed)
 Follow up as needed or as scheduled APPLY the combination cream twice daily to the areas ADD a daily Claritin or Zyrtec (store brand generic is just as good) to help w/ itching Call with any questions or concerns Hang in there!

## 2023-04-10 NOTE — Progress Notes (Signed)
   Subjective:    Patient ID: Parker Saunders, male    DOB: 03/29/1965, 58 y.o.   MRN: [id number]  HPI Rash- sxs first started ~1 month ago.  Started after pt was doing yard work.  Only on legs.  Area was very itchy last week.  Used OTC poison ivy creams w/ some relief.  Pt reports areas are slowly improving.  States legs were much more red last week.  This week redness is fading and he has well demarcated patches w/ scaling   Review of Systems For ROS see HPI     Objective:   Physical Exam Vitals reviewed.  Constitutional:      General: He is not in acute distress.    Appearance: Normal appearance. He is not ill-appearing.  HENT:     Head: Normocephalic and atraumatic.  Skin:    General: Skin is warm and dry.     Findings: Rash (well demarcated scaling patches of lower legs bilaterally) present.  Neurological:     General: No focal deficit present.     Mental Status: He is alert and oriented to person, place, and time.  Psychiatric:        Mood and Affect: Mood normal.        Behavior: Behavior normal.        Thought Content: Thought content normal.           Assessment & Plan:  Rash- new.  Unclear if this is contact dermatitis or ringworm based on the well demarcated scaling areas.  Pt reports areas are improving w/ time.  Will start topical Lotrisone cream BID.  Daily antihistamine prn itching.  Reviewed supportive care and red flags that should prompt return.  Pt expressed understanding and is in agreement w/ plan.

## 2023-04-27 ENCOUNTER — Other Ambulatory Visit (HOSPITAL_COMMUNITY): Payer: Self-pay | Admitting: *Deleted

## 2023-04-27 DIAGNOSIS — I4819 Other persistent atrial fibrillation: Secondary | ICD-10-CM

## 2023-04-27 MED ORDER — APIXABAN 5 MG PO TABS
5.0000 mg | ORAL_TABLET | Freq: Two times a day (BID) | ORAL | 5 refills | Status: DC
Start: 2023-04-27 — End: 2023-10-23

## 2023-06-16 ENCOUNTER — Encounter: Payer: Self-pay | Admitting: Family Medicine

## 2023-06-16 ENCOUNTER — Ambulatory Visit (INDEPENDENT_AMBULATORY_CARE_PROVIDER_SITE_OTHER): Payer: Medicare HMO | Admitting: Family Medicine

## 2023-06-16 VITALS — BP 126/78 | HR 81 | Temp 98.1°F | Ht 75.0 in | Wt 276.4 lb

## 2023-06-16 DIAGNOSIS — E785 Hyperlipidemia, unspecified: Secondary | ICD-10-CM | POA: Diagnosis not present

## 2023-06-16 DIAGNOSIS — I4891 Unspecified atrial fibrillation: Secondary | ICD-10-CM

## 2023-06-16 DIAGNOSIS — J309 Allergic rhinitis, unspecified: Secondary | ICD-10-CM | POA: Diagnosis not present

## 2023-06-16 DIAGNOSIS — I10 Essential (primary) hypertension: Secondary | ICD-10-CM

## 2023-06-16 LAB — LIPID PANEL
Cholesterol: 138 mg/dL (ref 0–200)
HDL: 34.4 mg/dL — ABNORMAL LOW (ref >39.00)
LDL Cholesterol: 75 mg/dL (ref 0–99)
NonHDL: 103.23
Total CHOL/HDL Ratio: 4
Triglycerides: 140 mg/dL (ref 0.0–149.0)
VLDL: 28 mg/dL (ref 0.0–40.0)

## 2023-06-16 LAB — COMPREHENSIVE METABOLIC PANEL WITH GFR
ALT: 20 U/L (ref 0–53)
AST: 16 U/L (ref 0–37)
Albumin: 4.2 g/dL (ref 3.5–5.2)
Alkaline Phosphatase: 89 U/L (ref 39–117)
BUN: 17 mg/dL (ref 6–23)
CO2: 29 meq/L (ref 19–32)
Calcium: 9.5 mg/dL (ref 8.4–10.5)
Chloride: 102 meq/L (ref 96–112)
Creatinine, Ser: 0.99 mg/dL (ref 0.40–1.50)
GFR: 83.71 mL/min (ref >60.00)
Glucose, Bld: 92 mg/dL (ref 70–99)
Potassium: 4.2 meq/L (ref 3.5–5.1)
Sodium: 138 meq/L (ref 135–145)
Total Bilirubin: 0.5 mg/dL (ref 0.2–1.2)
Total Protein: 7.8 g/dL (ref 6.0–8.3)

## 2023-06-16 MED ORDER — LOSARTAN POTASSIUM-HCTZ 100-12.5 MG PO TABS
1.0000 | ORAL_TABLET | Freq: Every day | ORAL | 2 refills | Status: DC
Start: 2023-06-16 — End: 2023-12-28

## 2023-06-16 MED ORDER — ATORVASTATIN CALCIUM 20 MG PO TABS
20.0000 mg | ORAL_TABLET | Freq: Every day | ORAL | 2 refills | Status: DC
Start: 2023-06-16 — End: 2023-12-28

## 2023-06-16 NOTE — Progress Notes (Signed)
 Subjective:  Patient ID: Parker Saunders, male    DOB: Mar 13, 1965  Age: 58 y.o. MRN: [id number]  CC:  Chief Complaint  Patient presents with   Medical Management of Chronic Issues    Pt is doing well, no concerns, pt is fasting     HPI [name] presents for   Hypertension: With history of atrial fibrillation, obstructive sleep apnea.  Mild OSA on previous testing, no central apnea.  Decided against treatment.  Hypertension treated losartan /HCTZ 100/12.5 mg daily, rate control of A-fib with Toprol -XL 50 mg daily, flecainide  100 mg daily for rhythm control and Eliquis  5 mg twice daily for anticoagulation.  Cardiology Dr. Arlester Ladd.  Status post ablation August last year.  Sinus rhythm since ablation.   Slight elevated creatinine previously.  1.28 in July 2024 improved on repeat testing last November. Appointment with cardiology/electrophysiology November 21.  Continued on apixaban  5 mg twice daily, losartan /HCTZ 100/12.5 mg, and Toprol -XL reduced to 25 mg daily and flecainide  was discontinued as he had maintained sinus rhythm since ablation. Appt next week.  No side effects with meds, no palpitations, chest pains. No new sx's.  Sleeping ok. Feels well rested in am.  No new bleeding.  Zyrtec, mucinex for allergies.  Home readings: 125/70 range.  BP Readings from Last 3 Encounters:  06/16/23 126/78  04/10/23 138/78  12/29/22 118/78   Lab Results  Component Value Date   CREATININE 1.05 12/12/2022    Hyperlipidemia: Lipitor 20 mg daily without new myalgias/side effects. No new side effects.   Lab Results  Component Value Date   CHOL 140 12/12/2022   HDL 33.10 (L) 12/12/2022   LDLCALC 76 12/12/2022   LDLDIRECT 88.0 06/13/2022   TRIG 158.0 (H) 12/12/2022   CHOLHDL 4 12/12/2022   Lab Results  Component Value Date   ALT 21 12/12/2022   AST 21 12/12/2022   ALKPHOS 90 12/12/2022   BILITOT 0.4 12/12/2022   HM: declines AWV, covid and shingles vaccines.    History Patient Active Problem List   Diagnosis Date Noted   Hypercoagulable state due to persistent atrial fibrillation (HCC) 10/11/2022   Persistent atrial fibrillation (HCC) 01/25/2022   Spinal stenosis of lumbar region 02/04/2016   Essential hypertension 01/25/2013   Past Medical History:  Diagnosis Date   Hypertension    Renal stone    Rotator cuff disorder    Past Surgical History:  Procedure Laterality Date   ATRIAL FIBRILLATION ABLATION N/A 09/12/2022   Procedure: ATRIAL FIBRILLATION ABLATION;  Surgeon: Efraim Grange, MD;  Location: [facility] INVASIVE CV LAB;  Service: Cardiovascular;  Laterality: N/A;   BACK SURGERY  2005   CARDIOVERSION N/A 02/17/2022   Procedure: CARDIOVERSION;  Surgeon: Hazle Lites, MD;  Location[facility] ENDOSCOPY;  Service: Cardiovascular;  Laterality: N/A;   LUMBAR LAMINECTOMY/DECOMPRESSION MICRODISCECTOMY Left 02/04/2016   Procedure: REDO MICRO LUMBAR DECOMPRESSION L4-5, L5-S1 ON THE LEFT  2 LEVELS;  Surgeon: Orvan Blanch, MD;  Location: [facility] ORS;  Service: Orthopedics;  Laterality: Left;   SHOULDER SURGERY Right 2014   SPINE SURGERY  02/07/2005   [facility]   No Known Allergies Prior to Admission medications   Medication Sig Start Date End Date Taking? Authorizing Provider  acetaminophen  (TYLENOL ) 500 MG tablet Take 500 mg by mouth every 6 (six) hours as needed for moderate pain or mild pain.   Yes [provider]  apixaban  (ELIQUIS ) 5 MG TABS tablet Take 1 tablet (5 mg total) by mouth 2 (two) times daily. 04/27/23  Yes Fenton, Clint R, PA  atorvastatin  (LIPITOR) 20 MG tablet TAKE 1 TABLET(20 MG) BY MOUTH DAILY 10/03/22  Yes Benjiman Bras, MD  clotrimazole -betamethasone  (LOTRISONE ) cream Apply 1 Application topically 2 (two) times daily. 04/10/23  Yes Tabori, Katherine E, MD  losartan -hydrochlorothiazide  (HYZAAR) 100-12.5 MG tablet TAKE 1 TABLET BY MOUTH DAILY 10/10/22  Yes Benjiman Bras, MD  metoprolol  succinate (TOPROL  XL) 25 MG 24  hr tablet Take 1 tablet (25 mg total) by mouth daily. 12/29/22  Yes Mealor, Donnamae Gaba, MD   Social History   Socioeconomic History   Marital status: Married    Spouse name: Not on file   Number of children: 2   Years of education: Not on file   Highest education level: 12th grade  Occupational History   Occupation: unemployed    Comment: since 2015  Tobacco Use   Smoking status: Never   Smokeless tobacco: Never   Tobacco comments:    Never smoke 02/24/22  Substance and Sexual Activity   Alcohol use: No   Drug use: No   Sexual activity: Yes  Other Topics Concern   Not on file  Social History Narrative   Marital status: married      Children: 2 daughters      Lives: with wife, 2 daughters      Employment: unemployed after termination in 2015     Tobacco: none      Alcohol: none      Exercise:  Walking 4-5 miles daily   Social Drivers of Health   Financial Resource Strain: Medium Risk (04/07/2023)   Overall Financial Resource Strain (CARDIA)    Difficulty of Paying Living Expenses: Somewhat hard  Food Insecurity: Food Insecurity Present (04/07/2023)   Hunger Vital Sign    Worried About Running Out of Food in the Last Year: Sometimes true    Ran Out of Food in the Last Year: Sometimes true  Transportation Needs: No Transportation Needs (04/07/2023)   PRAPARE - Administrator, Civil Service (Medical): No    Lack of Transportation (Non-Medical): No  Physical Activity: Insufficiently Active (04/07/2023)   Exercise Vital Sign    Days of Exercise per Week: 3 days    Minutes of Exercise per Session: 30 min  Stress: No Stress Concern Present (04/07/2023)   [employer] of Occupational Health - Occupational Stress Questionnaire    Feeling of Stress : Not at all  Social Connections: Moderately Isolated (04/07/2023)   Social Connection and Isolation Panel [NHANES]    Frequency of Communication with Friends and Family: Once a week    Frequency of Social Gatherings  with Friends and Family: Once a week    Attends Religious Services: More than 4 times per year    Active Member of [employer] or Organizations: No    Attends Banker Meetings: Never    Marital Status: Married  Catering manager Violence: Not At Risk (07/15/2021)   Humiliation, Afraid, Rape, and Kick questionnaire    Fear of Current or Ex-Partner: No    Emotionally Abused: No    Physically Abused: No    Sexually Abused: No    Review of Systems  Constitutional:  Negative for fatigue and unexpected weight change.  Eyes:  Negative for visual disturbance.  Respiratory:  Negative for cough, chest tightness and shortness of breath.   Cardiovascular:  Negative for chest pain, palpitations and leg swelling.  Gastrointestinal:  Negative for abdominal pain and blood in stool.  Neurological:  Negative for dizziness, light-headedness and headaches.     Objective:   Vitals:   06/16/23 0954  BP: 126/78  Pulse: 81  Temp: 98.1 F (36.7 C)  TempSrc: Temporal  SpO2: 97%  Weight: 276 lb 6.4 oz (125.4 kg)  Height: 6\' 3"  (1.905 m)     Physical Exam Vitals reviewed.  Constitutional:      Appearance: He is well-developed.  HENT:     Head: Normocephalic and atraumatic.     Ears:     Comments: Moderate cerumen in the right canal greater than left, not completely obstructed.    Nose:     Comments: Edematous turbinates, no active discharge.    Mouth/Throat:     Mouth: Mucous membranes are moist.     Pharynx: Oropharynx is clear.  Neck:     Vascular: No carotid bruit or JVD.  Cardiovascular:     Rate and Rhythm: Normal rate and regular rhythm.     Heart sounds: Normal heart sounds. No murmur heard. Pulmonary:     Effort: Pulmonary effort is normal.     Breath sounds: Normal breath sounds. No rales.  Musculoskeletal:     Right lower leg: No edema.     Left lower leg: No edema.  Skin:    General: Skin is warm and dry.  Neurological:     Mental Status: He is alert and oriented  to person, place, and time.  Psychiatric:        Mood and Affect: Mood normal.        Assessment & Plan:  Mihail Morelan is a 58 y.o. male . Essential hypertension - Plan: losartan -hydrochlorothiazide  (HYZAAR) 100-12.5 MG tablet, Comprehensive metabolic panel with GFR  - Tolerating current med regimen, continue same, check labs and adjust plan accordingly, continue follow-up with specialist as planned  Hyperlipidemia, unspecified hyperlipidemia type - Plan: atorvastatin  (LIPITOR) 20 MG tablet, Comprehensive metabolic panel with GFR, Lipid panel  - Tolerating current regimen with Lipitor, check labs and adjust plan accordingly.  Allergic rhinitis, unspecified seasonality, unspecified trigger  - New concern, okay to continue over-the-counter antihistamine, addition of steroid nasal spray with correct technique discussed.  Avoid decongestants given cardiac conditions.  Over-the-counter Coricidin/expectorant only if needed.  RTC precautions.  Also discussed excess cerumen and RTC precautions if not improved with over-the-counter treatments.  Atrial fibrillation, unspecified type (HCC)  - Stable, continue follow-up with specialist as planned, doing well off flecainide .  He plans to discuss the dosage of Toprol  or possibly coming off Toprol  but blood pressure stable at this time.  No changes.  No bleeding with use of Eliquis .  Continue to monitor.  Meds ordered this encounter  Medications   losartan -hydrochlorothiazide  (HYZAAR) 100-12.5 MG tablet    Sig: Take 1 tablet by mouth daily.    Dispense:  90 tablet    Refill:  2   atorvastatin  (LIPITOR) 20 MG tablet    Sig: Take 1 tablet (20 mg total) by mouth daily. TAKE 1 TABLET(20 MG) BY MOUTH DAILY    Dispense:  90 tablet    Refill:  2   Patient Instructions  Thanks for coming in today.  No change in meds for now, I will check some labs and let you know if there are concerns.  Keep follow-up with your heart specialist as planned.  For  allergies okay to continue either Claritin or Zyrtec over-the-counter, but try adding Flonase nasal spray using the technique we discussed.  Coricidin HBP is okay or plain Mucinex but  avoid any over-the-counter medicines with decongestants.  There was some cerumen or wax in the right ear but not completely obstructed.  You can use over-the-counter Debrox or follow-up with me if we need to perform a lavage or procedure to remove that wax.  Thanks for coming in today and take care!    Signed,   Caro Christmas, MD [facility], [facility] 06/16/23 10:36 AM

## 2023-06-16 NOTE — Patient Instructions (Signed)
 Thanks for coming in today.  No change in meds for now, I will check some labs and let you know if there are concerns.  Keep follow-up with your heart specialist as planned.  For allergies okay to continue either Claritin or Zyrtec over-the-counter, but try adding Flonase nasal spray using the technique we discussed.  Coricidin HBP is okay or plain Mucinex but avoid any over-the-counter medicines with decongestants.  There was some cerumen or wax in the right ear but not completely obstructed.  You can use over-the-counter Debrox or follow-up with me if we need to perform a lavage or procedure to remove that wax.  Thanks for coming in today and take care!

## 2023-06-20 ENCOUNTER — Ambulatory Visit: Payer: Self-pay | Admitting: Family Medicine

## 2023-06-22 ENCOUNTER — Encounter (HOSPITAL_COMMUNITY): Payer: Self-pay | Admitting: Physician Assistant

## 2023-06-22 ENCOUNTER — Ambulatory Visit (HOSPITAL_COMMUNITY)
Admission: RE | Admit: 2023-06-22 | Discharge: 2023-06-22 | Disposition: A | Discharge status: Home / Self Care | Payer: Medicare HMO | Source: Ambulatory Visit | Attending: Physician Assistant | Admitting: Physician Assistant

## 2023-06-22 VITALS — BP 112/80 | HR 90 | Ht 75.0 in | Wt 275.6 lb

## 2023-06-22 DIAGNOSIS — I4819 Other persistent atrial fibrillation: Secondary | ICD-10-CM | POA: Diagnosis not present

## 2023-06-22 DIAGNOSIS — G4733 Obstructive sleep apnea (adult) (pediatric): Secondary | ICD-10-CM

## 2023-06-22 DIAGNOSIS — D6869 Other thrombophilia: Secondary | ICD-10-CM

## 2023-06-22 NOTE — Progress Notes (Addendum)
 Primary Care Physician: Benjiman Bras, MD Primary Cardiologist: Dr Loetta Ringer Primary Electrophysiologist: Dr Arlester Ladd  Referring Physician: Dr Loetta Ringer   Parker Saunders is a 58 y.o. male with a history of HTN, HLD, CAD, OSA, atrial fibrillation who presents for follow up in the [facility].  The patient was initially diagnosed with atrial fibrillation 12/2021 at a routine physical with his PCP. An irregular heart beat was noted on auscultation and and ECG showed afib. He was referred to cardiology and seen by Dr Loetta Ringer who started Eliquis  for a CHADS2VASC score of 1 and metoprolol  for rate control. Patient does admit to snoring. No significant alcohol use.   Patient is s/p DCCV on 02/17/22 which was unsuccessful after 3 shocks. He was started on flecainide  and chemically converted to SR, DCCV on 03/15/22 was cancelled. Patient is s/p afib ablation with Dr Arlester Ladd on 09/12/22. Flecainide  discontinued 12/29/22.  Patient returns for follow up for atrial fibrillation. He denies any interim symptoms of afib. No bleeding issues on anticoagulation. He does have some fatigue with strenuous exertion and exercise.   Today, he  denies symptoms of palpitations, chest pain, shortness of breath, orthopnea, PND, lower extremity edema, dizziness, presyncope, syncope, bleeding, or neurologic sequela. The patient is tolerating medications without difficulties and is otherwise without complaint today.    Atrial Fibrillation Risk Factors:  he does have symptoms or diagnosis of sleep apnea. he does not have a history of rheumatic fever. he does not have a history of alcohol use. The patient does not have a history of early familial atrial fibrillation or other arrhythmias.   Atrial Fibrillation Management history:  Previous antiarrhythmic drugs: flecainide   Previous cardioversions: 02/17/22 Previous ablations: 09/12/22 Anticoagulation history: Eliquis    Past Medical History:  Diagnosis  Date   Hypertension    Renal stone    Rotator cuff disorder     Current Outpatient Medications  Medication Sig Dispense Refill   acetaminophen  (TYLENOL ) 500 MG tablet Take 500 mg by mouth every 6 (six) hours as needed for moderate pain or mild pain.     apixaban  (ELIQUIS ) 5 MG TABS tablet Take 1 tablet (5 mg total) by mouth 2 (two) times daily. 60 tablet 5   atorvastatin  (LIPITOR) 20 MG tablet Take 1 tablet (20 mg total) by mouth daily. TAKE 1 TABLET(20 MG) BY MOUTH DAILY 90 tablet 2   clotrimazole -betamethasone  (LOTRISONE ) cream Apply 1 Application topically 2 (two) times daily. 45 g 1   losartan -hydrochlorothiazide  (HYZAAR) 100-12.5 MG tablet Take 1 tablet by mouth daily. 90 tablet 2   metoprolol  succinate (TOPROL  XL) 25 MG 24 hr tablet Take 1 tablet (25 mg total) by mouth daily. 90 tablet 3   No current facility-administered medications for this encounter.    ROS- All systems are reviewed and negative except as per the HPI above.  Physical Exam: Vitals:   06/22/23 0917  BP: 112/80  Pulse: 90  Weight: 125 kg  Height: 6\' 3"  (1.905 m)     GEN: Well nourished, well developed in no acute distress CARDIAC: Regular rate and rhythm, no murmurs, rubs, gallops RESPIRATORY:  Clear to auscultation without rales, wheezing or rhonchi  ABDOMEN: Soft, non-tender, non-distended EXTREMITIES:  No edema; No deformity    Wt Readings from Last 3 Encounters:  06/22/23 125 kg  06/16/23 125.4 kg  04/10/23 126.6 kg    EKG today demonstrates  SR Vent. rate 90 BPM PR interval 154 ms QRS duration 90 ms QT/QTcB 338/413 ms  Echo 01/21/22 demonstrated   1. Left ventricular ejection fraction, by estimation, is 60 to 65%. The  left ventricle has normal function. The left ventricle has no regional  wall motion abnormalities. There is mild left ventricular hypertrophy of  the basal-septal segment. Left ventricular diastolic function could not be evaluated.   2. Right ventricular systolic  function is normal. The right ventricular  size is normal. There is normal pulmonary artery systolic pressure. The  estimated right ventricular systolic pressure is 29.4 mmHg.   3. Left atrial size was mildly dilated.   4. The mitral valve is normal in structure. Trivial mitral valve  regurgitation. No evidence of mitral stenosis.   5. The aortic valve is normal in structure. Aortic valve regurgitation is  not visualized. No aortic stenosis is present.   6. Aortic dilatation noted. There is borderline dilatation of the aortic  root, measuring 37 mm.   7. The inferior vena cava is normal in size with greater than 50%  respiratory variability, suggesting right atrial pressure of 3 mmHg.   Epic records are reviewed at length today  CHA2DS2-VASc Score = 2  The patient's score is based upon: CHF History: 0 HTN History: 1 Diabetes History: 0 Stroke History: 0 Vascular Disease History: 1 (CAD on CT) Age Score: 0 Gender Score: 0       ASSESSMENT AND PLAN: Persistent Atrial Fibrillation (ICD10:  I48.19) The patient's CHA2DS2-VASc score is 2, indicating a 2.2% annual risk of stroke.   S/p afib ablation 09/12/22, now off flecainide  Patient appears to be maintaining SR Continue Eliquis  5 mg BID Will stop Toprol  to see if this helps with exercise intolerance.   Secondary Hypercoagulable State (ICD10:  D68.69) The patient is at significant risk for stroke/thromboembolism based upon his CHA2DS2-VASc Score of 2.  Continue Apixaban  (Eliquis ). No bleeding issues.    HTN Stable on current regimen  Obesity Body mass index is 34.45 kg/m.  Encouraged lifestyle modification  OSA  Mild on sleep study 05/20/22 The importance of adequate treatment of sleep apnea was discussed today in order to improve our ability to maintain sinus rhythm long term. Patient does not want to start CPAP at this time.   CAD CAC score 109 on CT No anginal symptoms   Follow up in the AF clinic in 6 months.      Myrtha Ates PA-C [facility] [address] [place], [place] [address] [phone] 06/22/2023 9:23 AM

## 2023-07-04 ENCOUNTER — Other Ambulatory Visit: Payer: Self-pay | Admitting: Family Medicine

## 2023-07-04 DIAGNOSIS — I10 Essential (primary) hypertension: Secondary | ICD-10-CM

## 2023-10-21 ENCOUNTER — Other Ambulatory Visit (HOSPITAL_COMMUNITY): Payer: Self-pay | Admitting: Physician Assistant

## 2023-10-21 DIAGNOSIS — I4819 Other persistent atrial fibrillation: Secondary | ICD-10-CM

## 2023-10-23 ENCOUNTER — Other Ambulatory Visit (HOSPITAL_COMMUNITY): Payer: Self-pay | Admitting: *Deleted

## 2023-10-23 DIAGNOSIS — I4819 Other persistent atrial fibrillation: Secondary | ICD-10-CM

## 2023-10-23 MED ORDER — APIXABAN 5 MG PO TABS: 5.0000 mg | ORAL_TABLET | Freq: Two times a day (BID) | ORAL | 6 refills | Status: AC

## 2023-12-17 DIAGNOSIS — I10 Essential (primary) hypertension: Secondary | ICD-10-CM | POA: Diagnosis not present

## 2023-12-17 DIAGNOSIS — E785 Hyperlipidemia, unspecified: Secondary | ICD-10-CM | POA: Diagnosis not present

## 2023-12-17 DIAGNOSIS — I2510 Atherosclerotic heart disease of native coronary artery without angina pectoris: Secondary | ICD-10-CM | POA: Diagnosis not present

## 2023-12-17 DIAGNOSIS — Z6833 Body mass index (BMI) 33.0-33.9, adult: Secondary | ICD-10-CM | POA: Diagnosis not present

## 2023-12-17 DIAGNOSIS — I4891 Unspecified atrial fibrillation: Secondary | ICD-10-CM | POA: Diagnosis not present

## 2023-12-17 DIAGNOSIS — E669 Obesity, unspecified: Secondary | ICD-10-CM | POA: Diagnosis not present

## 2023-12-17 DIAGNOSIS — Z7901 Long term (current) use of anticoagulants: Secondary | ICD-10-CM | POA: Diagnosis not present

## 2023-12-17 DIAGNOSIS — D6869 Other thrombophilia: Secondary | ICD-10-CM | POA: Diagnosis not present

## 2023-12-17 DIAGNOSIS — J449 Chronic obstructive pulmonary disease, unspecified: Secondary | ICD-10-CM | POA: Diagnosis not present

## 2023-12-18 ENCOUNTER — Ambulatory Visit: Admitting: Family Medicine

## 2023-12-19 ENCOUNTER — Ambulatory Visit (HOSPITAL_COMMUNITY)
Admission: RE | Admit: 2023-12-19 | Discharge: 2023-12-19 | Disposition: A | Discharge status: Home / Self Care | Source: Ambulatory Visit | Attending: Physician Assistant | Admitting: Physician Assistant

## 2023-12-19 VITALS — BP 136/82 | HR 85 | Ht 75.0 in | Wt 275.2 lb

## 2023-12-19 DIAGNOSIS — I4891 Unspecified atrial fibrillation: Secondary | ICD-10-CM | POA: Diagnosis not present

## 2023-12-19 DIAGNOSIS — I4819 Other persistent atrial fibrillation: Secondary | ICD-10-CM

## 2023-12-19 DIAGNOSIS — D6869 Other thrombophilia: Secondary | ICD-10-CM | POA: Diagnosis not present

## 2023-12-19 NOTE — Progress Notes (Signed)
 Primary Care Physician: Levora Reyes SAUNDERS, MD Primary Cardiologist: Dr Burnard Primary Electrophysiologist: Dr Nancey  Referring Physician: Dr Burnard   Parker Saunders is a 58 y.o. male with a history of HTN, HLD, CAD, OSA, atrial fibrillation who presents for follow up in the [facility].  The patient was initially diagnosed with atrial fibrillation 12/2021 at a routine physical with his PCP. An irregular heart beat was noted on auscultation and and ECG showed afib. He was referred to cardiology and seen by Dr Burnard who started Eliquis  for a CHADS2VASC score of 1 and metoprolol  for rate control. Patient does admit to snoring. No significant alcohol use.   Patient is s/p DCCV on 02/17/22 which was unsuccessful after 3 shocks. He was started on flecainide  and chemically converted to SR, DCCV on 03/15/22 was cancelled. Patient is s/p afib ablation with Dr Nancey on 09/12/22. Flecainide  discontinued 12/29/22.  Patient returns for follow up for atrial fibrillation. He is in SR today and feels well. He denies any interim symptoms of afib. His energy has improved off BB. No bleeding issues on anticoagulation.   Today, he  denies symptoms of palpitations, chest pain, shortness of breath, orthopnea, PND, lower extremity edema, dizziness, presyncope, syncope, bleeding, or neurologic sequela. The patient is tolerating medications without difficulties and is otherwise without complaint today.    Atrial Fibrillation Risk Factors:  he does have symptoms or diagnosis of sleep apnea. he does not have a history of rheumatic fever. he does not have a history of alcohol use. The patient does not have a history of early familial atrial fibrillation or other arrhythmias.   Atrial Fibrillation Management history:  Previous antiarrhythmic drugs: flecainide   Previous cardioversions: 02/17/22 Previous ablations: 09/12/22 Anticoagulation history: Eliquis    Past Medical History:   Diagnosis Date   Hypertension    Renal stone    Rotator cuff disorder     Current Outpatient Medications  Medication Sig Dispense Refill   acetaminophen  (TYLENOL ) 500 MG tablet Take 500 mg by mouth every 6 (six) hours as needed for moderate pain or mild pain. (Patient taking differently: Take 500 mg by mouth as needed for moderate pain (pain score 4-6) or mild pain (pain score 1-3).)     apixaban  (ELIQUIS ) 5 MG TABS tablet Take 1 tablet (5 mg total) by mouth 2 (two) times daily. 60 tablet 6   atorvastatin  (LIPITOR) 20 MG tablet Take 1 tablet (20 mg total) by mouth daily. TAKE 1 TABLET(20 MG) BY MOUTH DAILY 90 tablet 2   losartan -hydrochlorothiazide  (HYZAAR) 100-12.5 MG tablet Take 1 tablet by mouth daily. 90 tablet 2   No current facility-administered medications for this encounter.    ROS- All systems are reviewed and negative except as per the HPI above.  Physical Exam: Vitals:   12/19/23 1119  BP: 136/82  Pulse: 85  Weight: 124.8 kg  Height: 6' 3 (1.905 m)   GEN: Well nourished, well developed in no acute distress CARDIAC: Regular rate and rhythm, no murmurs, rubs, gallops RESPIRATORY:  Clear to auscultation without rales, wheezing or rhonchi  ABDOMEN: Soft, non-tender, non-distended EXTREMITIES:  No edema; No deformity    Wt Readings from Last 3 Encounters:  12/19/23 124.8 kg  06/22/23 125 kg  06/16/23 125.4 kg    EKG today demonstrates  SR Vent. rate 85 BPM PR interval 158 ms QRS duration 88 ms QT/QTcB 346/411 ms   Echo 01/21/22 demonstrated   1. Left ventricular ejection fraction, by estimation, is  60 to 65%. The  left ventricle has normal function. The left ventricle has no regional  wall motion abnormalities. There is mild left ventricular hypertrophy of  the basal-septal segment. Left ventricular diastolic function could not be evaluated.   2. Right ventricular systolic function is normal. The right ventricular  size is normal. There is normal  pulmonary artery systolic pressure. The  estimated right ventricular systolic pressure is 29.4 mmHg.   3. Left atrial size was mildly dilated.   4. The mitral valve is normal in structure. Trivial mitral valve  regurgitation. No evidence of mitral stenosis.   5. The aortic valve is normal in structure. Aortic valve regurgitation is  not visualized. No aortic stenosis is present.   6. Aortic dilatation noted. There is borderline dilatation of the aortic  root, measuring 37 mm.   7. The inferior vena cava is normal in size with greater than 50%  respiratory variability, suggesting right atrial pressure of 3 mmHg.   Epic records are reviewed at length today  CHA2DS2-VASc Score = 2  The patient's score is based upon: CHF History: 0 HTN History: 1 Diabetes History: 0 Stroke History: 0 Vascular Disease History: 1 Age Score: 0 Gender Score: 0       ASSESSMENT AND PLAN: Persistent Atrial Fibrillation (ICD10:  I48.19) The patient's CHA2DS2-VASc score is 2, indicating a 2.2% annual risk of stroke.   S/p afib ablation 09/12/22 Patient appears to be maintaining SR Continue Eliquis  5 mg BID  Secondary Hypercoagulable State (ICD10:  D68.69) The patient is at significant risk for stroke/thromboembolism based upon his CHA2DS2-VASc Score of 2.  Continue Apixaban  (Eliquis ). No bleeding issues.    HTN Stable on current regimen  Obesity Body mass index is 34.4 kg/m.  Encouraged lifestyle modification  OSA  Mild OSA 05/20/22 Patient not interested in CPAP at this time.   CAD CAC score 109 No anginal symptoms   Follow up with Dr Nancey in one year.    Daril Kicks PA-C [facility] [address] [place], [place] [address] [phone] 12/19/2023 11:45 AM

## 2023-12-26 ENCOUNTER — Ambulatory Visit: Admitting: *Deleted

## 2023-12-26 VITALS — Ht 75.0 in | Wt 275.0 lb

## 2023-12-26 DIAGNOSIS — Z0000 Encounter for general adult medical examination without abnormal findings: Secondary | ICD-10-CM | POA: Diagnosis not present

## 2023-12-26 NOTE — Patient Instructions (Signed)
 [name],  Thank you for taking the time for your Medicare Wellness Visit. I appreciate your continued commitment to your health goals. Please review the care plan we discussed, and feel free to reach out if I can assist you further.  Please note that Annual Wellness Visits do not include a physical exam. Some assessments may be limited, especially if the visit was conducted virtually. If needed, we may recommend an in-person follow-up with your provider.  Ongoing Care Seeing your primary care provider every 3 to 6 months helps us  monitor your health and provide consistent, personalized care.   Referrals If a referral was made during today's visit and you haven't received any updates within two weeks, please contact the referred provider directly to check on the status.  Recommended Screenings:  Health Maintenance  Topic Date Due   Hepatitis B Vaccine (1 of 3 - 19+ 3-dose series) Never done   Zoster (Shingles) Vaccine (1 of 2) Never done   Pneumococcal Vaccine for age over 67 (1 of 1 - PCV) Never done   COVID-19 Vaccine (1) 01/11/2024*   Flu Shot  05/07/2024*   Cologuard (Stool DNA test)  04/21/2024   Medicare Annual Wellness Visit  12/25/2024   DTaP/Tdap/Td vaccine (2 - Td or Tdap) 01/17/2028   Hepatitis C Screening  Completed   HIV Screening  Completed   HPV Vaccine  Aged Out   Meningitis B Vaccine  Aged Out  *Topic was postponed. The date shown is not the original due date.       12/25/2023    9:19 PM  Advanced Directives  Does Patient Have a Medical Advance Directive? No  Would patient like information on creating a medical advance directive? No - Patient declined    Vision: Annual vision screenings are recommended for early detection of glaucoma, cataracts, and diabetic retinopathy. These exams can also reveal signs of chronic conditions such as diabetes and high blood pressure.  Dental: Annual dental screenings help detect early signs of oral cancer, gum disease,  and other conditions linked to overall health, including heart disease and diabetes.  Please see the attached documents for additional preventive care recommendations.    [name] , Thank you for taking time to come for your Medicare Wellness Visit. I appreciate your ongoing commitment to your health goals. Please review the following plan we discussed and let me know if I can assist you in the future.   Screening recommendations/referrals: Colonoscopy:  Recommended yearly ophthalmology/optometry visit for glaucoma screening and checkup Recommended yearly dental visit for hygiene and checkup  Vaccinations: Influenza vaccine:  Pneumococcal vaccine:  Tdap vaccine:  Shingles vaccine:       Preventive Care 40-64 Years, Male Preventive care refers to lifestyle choices and visits with your health care provider that can promote health and wellness. What does preventive care include? A yearly physical exam. This is also called an annual well check. Dental exams once or twice a year. Routine eye exams. Ask your health care provider how often you should have your eyes checked. Personal lifestyle choices, including: Daily care of your teeth and gums. Regular physical activity. Eating a healthy diet. Avoiding tobacco and drug use. Limiting alcohol use. Practicing safe sex. Taking low-dose aspirin every day starting at age 76. What happens during an annual well check? The services and screenings done by your health care provider during your annual well check will depend on your age, overall health, lifestyle risk factors, and family history of disease. Counseling  Your health care provider may ask you questions about your: Alcohol use. Tobacco use. Drug use. Emotional well-being. Home and relationship well-being. Sexual activity. Eating habits. Work and work astronomer. Screening  You may have the following tests or measurements: Height, weight, and BMI. Blood  pressure. Lipid and cholesterol levels. These may be checked every 5 years, or more frequently if you are over 69 years old. Skin check. Lung cancer screening. You may have this screening every year starting at age 65 if you have a 30-pack-year history of smoking and currently smoke or have quit within the past 15 years. Fecal occult blood test (FOBT) of the stool. You may have this test every year starting at age 69. Flexible sigmoidoscopy or colonoscopy. You may have a sigmoidoscopy every 5 years or a colonoscopy every 10 years starting at age 71. Prostate cancer screening. Recommendations will vary depending on your family history and other risks. Hepatitis C blood test. Hepatitis B blood test. Sexually transmitted disease (STD) testing. Diabetes screening. This is done by checking your blood sugar (glucose) after you have not eaten for a while (fasting). You may have this done every 1-3 years. Discuss your test results, treatment options, and if necessary, the need for more tests with your health care provider. Vaccines  Your health care provider may recommend certain vaccines, such as: Influenza vaccine. This is recommended every year. Tetanus, diphtheria, and acellular pertussis (Tdap, Td) vaccine. You may need a Td booster every 10 years. Zoster vaccine. You may need this after age 56. Pneumococcal 13-valent conjugate (PCV13) vaccine. You may need this if you have certain conditions and have not been vaccinated. Pneumococcal polysaccharide (PPSV23) vaccine. You may need one or two doses if you smoke cigarettes or if you have certain conditions. Talk to your health care provider about which screenings and vaccines you need and how often you need them. This information is not intended to replace advice given to you by your health care provider. Make sure you discuss any questions you have with your health care provider. Document Released: 02/20/2015 Document Revised: 10/14/2015 Document  Reviewed: 11/25/2014 Elsevier Interactive Patient Education  2017 [employer].  Fall Prevention in the Home Falls can cause injuries. They can happen to people of all ages. There are many things you can do to make your home safe and to help prevent falls. What can I do on the outside of my home? Regularly fix the edges of walkways and driveways and fix any cracks. Remove anything that might make you trip as you walk through a door, such as a raised step or threshold. Trim any bushes or trees on the path to your home. Use bright outdoor lighting. Clear any walking paths of anything that might make someone trip, such as rocks or tools. Regularly check to see if handrails are loose or broken. Make sure that both sides of any steps have handrails. Any raised decks and porches should have guardrails on the edges. Have any leaves, snow, or ice cleared regularly. Use sand or salt on walking paths during winter. Clean up any spills in your garage right away. This includes oil or grease spills. What can I do in the bathroom? Use night lights. Install grab bars by the toilet and in the tub and shower. Do not use towel bars as grab bars. Use non-skid mats or decals in the tub or shower. If you need to sit down in the shower, use a plastic, non-slip stool. Keep the floor dry. Clean up  any water that spills on the floor as soon as it happens. Remove soap buildup in the tub or shower regularly. Attach bath mats securely with double-sided non-slip rug tape. Do not have throw rugs and other things on the floor that can make you trip. What can I do in the bedroom? Use night lights. Make sure that you have a light by your bed that is easy to reach. Do not use any sheets or blankets that are too big for your bed. They should not hang down onto the floor. Have a firm chair that has side arms. You can use this for support while you get dressed. Do not have throw rugs and other things on the floor that can  make you trip. What can I do in the kitchen? Clean up any spills right away. Avoid walking on wet floors. Keep items that you use a lot in easy-to-reach places. If you need to reach something above you, use a strong step stool that has a grab bar. Keep electrical cords out of the way. Do not use floor polish or wax that makes floors slippery. If you must use wax, use non-skid floor wax. Do not have throw rugs and other things on the floor that can make you trip. What can I do with my stairs? Do not leave any items on the stairs. Make sure that there are handrails on both sides of the stairs and use them. Fix handrails that are broken or loose. Make sure that handrails are as long as the stairways. Check any carpeting to make sure that it is firmly attached to the stairs. Fix any carpet that is loose or worn. Avoid having throw rugs at the top or bottom of the stairs. If you do have throw rugs, attach them to the floor with carpet tape. Make sure that you have a light switch at the top of the stairs and the bottom of the stairs. If you do not have them, ask someone to add them for you. What else can I do to help prevent falls? Wear shoes that: Do not have high heels. Have rubber bottoms. Are comfortable and fit you well. Are closed at the toe. Do not wear sandals. If you use a stepladder: Make sure that it is fully opened. Do not climb a closed stepladder. Make sure that both sides of the stepladder are locked into place. Ask someone to hold it for you, if possible. Clearly mark and make sure that you can see: Any grab bars or handrails. First and last steps. Where the edge of each step is. Use tools that help you move around (mobility aids) if they are needed. These include: Canes. Walkers. Scooters. Crutches. Turn on the lights when you go into a dark area. Replace any light bulbs as soon as they burn out. Set up your furniture so you have a clear path. Avoid moving your furniture  around. If any of your floors are uneven, fix them. If there are any pets around you, be aware of where they are. Review your medicines with your doctor. Some medicines can make you feel dizzy. This can increase your chance of falling. Ask your doctor what other things that you can do to help prevent falls. This information is not intended to replace advice given to you by your health care provider. Make sure you discuss any questions you have with your health care provider. Document Released: 11/20/2008 Document Revised: 07/02/2015 Document Reviewed: 02/28/2014 Elsevier Interactive Patient Education  2017  [employer].

## 2023-12-26 NOTE — Progress Notes (Signed)
 Chief Complaint  Patient presents with   Medicare Wellness     Subjective:   Parker Saunders is a 58 y.o. male who presents for a Medicare Annual Wellness Visit.  Allergies (verified) Patient has no known allergies.   History: Past Medical History:  Diagnosis Date   Hypertension    Renal stone    Rotator cuff disorder    Past Surgical History:  Procedure Laterality Date   ATRIAL FIBRILLATION ABLATION N/A 09/12/2022   Procedure: ATRIAL FIBRILLATION ABLATION;  Surgeon: Nancey Eulas BRAVO, MD;  Location: [facility];  Service: Cardiovascular;  Laterality: N/A;   BACK SURGERY  2005   CARDIOVERSION N/A 02/17/2022   Procedure: CARDIOVERSION;  Surgeon: Mona Vinie BROCKS, MD;  Location: [facility] ENDOSCOPY;  Service: Cardiovascular;  Laterality: N/A;   LUMBAR LAMINECTOMY/DECOMPRESSION MICRODISCECTOMY Left 02/04/2016   Procedure: REDO MICRO LUMBAR DECOMPRESSION L4-5, L5-S1 ON THE LEFT  2 LEVELS;  Surgeon: Reyes Billing, MD;  Location: [facility] ORS;  Service: Orthopedics;  Laterality: Left;   SHOULDER SURGERY Right 2014   SPINE SURGERY  02/07/2005   [facility]   Family History  Problem Relation Age of Onset   Hyperlipidemia Mother    Hypertension Mother    COPD Father    Hyperlipidemia Father    Hypertension Father    Social History   Occupational History   Occupation: unemployed    Comment: since 2015  Tobacco Use   Smoking status: Never   Smokeless tobacco: Never   Tobacco comments:    Never smoke 02/24/22  Substance and Sexual Activity   Alcohol use: No   Drug use: No   Sexual activity: Yes   Tobacco Counseling Counseling given: Not Answered Tobacco comments: Never smoke 02/24/22  SDOH Screenings   Food Insecurity: Food Insecurity Present (12/26/2023)  Housing: Low Risk  (12/26/2023)  Transportation Needs: No Transportation Needs (12/26/2023)  Utilities: Not At Risk (12/26/2023)  Alcohol Screen: Low Risk  (07/28/2022)  Depression (PHQ2-9): Low Risk  (12/26/2023)   Financial Resource Strain: Medium Risk (12/25/2023)  Physical Activity: Inactive (12/26/2023)  Social Connections: Unknown (12/26/2023)  Stress: No Stress Concern Present (12/26/2023)  Tobacco Use: Low Risk  (12/26/2023)  Health Literacy: Adequate Health Literacy (12/26/2023)   See flowsheets for full screening details  Depression Screen PHQ 2 & 9 Depression Scale- Over the past 2 weeks, how often have you been bothered by any of the following problems? Little interest or pleasure in doing things: 0 Feeling down, depressed, or hopeless (PHQ Adolescent also includes...irritable): 0 PHQ-2 Total Score: 0 Trouble falling or staying asleep, or sleeping too much: 0 Feeling tired or having little energy: 0 Poor appetite or overeating (PHQ Adolescent also includes...weight loss): 0 Feeling bad about yourself - or that you are a failure or have let yourself or your family down: 0 Trouble concentrating on things, such as reading the newspaper or watching television (PHQ Adolescent also includes...like school work): 0 Moving or speaking so slowly that other people could have noticed. Or the opposite - being so fidgety or restless that you have been moving around a lot more than usual: 0 Thoughts that you would be better off dead, or of hurting yourself in some way: 0 PHQ-9 Total Score: 0 If you checked off any problems, how difficult have these problems made it for you to do your work, take care of things at home, or get along with other people?: Not difficult at all     Goals Addressed  This Visit's Progress    Weight (lb) < 200 lb (90.7 kg)   275 lb (124.7 kg)      Visit info / Clinical Intake: Medicare Wellness Visit Type:: Subsequent Annual Wellness Visit Persons participating in visit:: patient Medicare Wellness Visit Mode:: Telephone If telephone:: video declined Because this visit was a virtual/telehealth visit:: vitals recorded from last visit If Telephone or Video  please confirm:: I connected with the patient using audio enabled telemedicine application and verified that I am speaking with the correct person using two identifiers Patient Location:: home Provider Location:: home Information given by:: patient Interpreter Needed?: No Pre-visit prep was completed: no AWV questionnaire completed by patient prior to visit?: yes Date:: 12/25/23 Living arrangements:: lives with spouse/significant other Patient's Overall Health Status Rating: good Typical amount of pain: some Does pain affect daily life?: no Are you currently prescribed opioids?: no  Dietary Habits and Nutritional Risks How many meals a day?: 2 Eats fruit and vegetables daily?: yes Most meals are obtained by: preparing own meals In the last 2 weeks, have you had any of the following?: none Diabetic:: no  Functional Status Activities of Daily Living (to include ambulation/medication): (Patient-Rptd) Independent Ambulation: (Patient-Rptd) Independent Medication Administration: Independent Home Management: (Patient-Rptd) Independent Manage your own finances?: yes Primary transportation is: driving Concerns about vision?: no *vision screening is required for WTM* Concerns about hearing?: no  Fall Screening Falls in the past year?: (Patient-Rptd) 0 Number of falls in past year: (Patient-Rptd) 0 Was there an injury with Fall?: (Patient-Rptd) 0 Fall Risk Category Calculator: (Patient-Rptd) 0 Patient Fall Risk Level: (Patient-Rptd) Low Fall Risk  Fall Risk Patient at Risk for Falls Due to: No Fall Risks Fall risk Follow up: Falls evaluation completed; Education provided; Falls prevention discussed  Home and Transportation Safety: All rugs have non-skid backing?: yes All stairs or steps have railings?: N/A, no stairs Grab bars in the bathtub or shower?: yes Have non-skid surface in bathtub or shower?: yes Good home lighting?: yes Regular seat belt use?: yes Hospital stays in  the last year:: no  Cognitive Assessment Difficulty concentrating, remembering, or making decisions? : no Will 6CIT or Mini Cog be Completed: yes What year is it?: 0 points What month is it?: 0 points Give patient an address phrase to remember (5 components): its very sunny outside today in November About what time is it?: 0 points Count backwards from 20 to 1: 0 points Say the months of the year in reverse: 0 points Repeat the address phrase from earlier: 0 points 6 CIT Score: 0 points  Advance Directives (For Healthcare) Does Patient Have a Medical Advance Directive?: No Would patient like information on creating a medical advance directive?: No - Patient declined  Reviewed/Updated  Reviewed/Updated: Reviewed All (Medical, Surgical, Family, Medications, Allergies, Care Teams, Patient Goals); Surgical History; Family History; Medications; Allergies; Care Teams; Patient Goals; Medical History        Objective:    Today's Vitals   12/26/23 1512  Weight: 275 lb (124.7 kg)  Height: 6' 3 (1.905 m)   Body mass index is 34.37 kg/m.  Current Medications (verified) Outpatient Encounter Medications as of 12/26/2023  Medication Sig   apixaban  (ELIQUIS ) 5 MG TABS tablet Take 1 tablet (5 mg total) by mouth 2 (two) times daily.   atorvastatin  (LIPITOR) 20 MG tablet Take 1 tablet (20 mg total) by mouth daily. TAKE 1 TABLET(20 MG) BY MOUTH DAILY   losartan -hydrochlorothiazide  (HYZAAR) 100-12.5 MG tablet Take 1 tablet by mouth daily.  acetaminophen  (TYLENOL ) 500 MG tablet Take 500 mg by mouth every 6 (six) hours as needed for moderate pain or mild pain. (Patient taking differently: Take 500 mg by mouth as needed for moderate pain (pain score 4-6) or mild pain (pain score 1-3).)   No facility-administered encounter medications on file as of 12/26/2023.   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 Up to  date Immunizations and Health Maintenance Health Maintenance  Topic Date Due   Hepatitis B Vaccines 19-59 Average Risk (1 of 3 - 19+ 3-dose series) Never done   Zoster Vaccines- Shingrix (1 of 2) Never done   Pneumococcal Vaccine: 50+ Years (1 of 1 - PCV) Never done   COVID-19 Vaccine (1) 01/11/2024 (Originally 07/02/1970)   Influenza Vaccine  05/07/2024 (Originally 09/08/2023)   Fecal DNA (Cologuard)  04/21/2024   Medicare Annual Wellness (AWV)  12/25/2024   DTaP/Tdap/Td (2 - Td or Tdap) 01/17/2028   Hepatitis C Screening  Completed   HIV Screening  Completed   HPV VACCINES  Aged Out   Meningococcal B Vaccine  Aged Out        Assessment/Plan:  This is a routine wellness examination for Omeed.  Patient Care Team: Levora Reyes SAUNDERS, MD as PCP - General (Family Medicine) Mealor, Eulas BRAVO, MD as PCP - Electrophysiology (Cardiology)  I have personally reviewed and noted the following in the patient's chart:   Medical and social history Use of alcohol, tobacco or illicit drugs  Current medications and supplements including opioid prescriptions. Functional ability and status Nutritional status Physical activity Advanced directives List of other physicians Hospitalizations, surgeries, and ER visits in previous 12 months Vitals Screenings to include cognitive, depression, and falls Referrals and appointments  No orders of the defined types were placed in this encounter.  In addition, I have reviewed and discussed with patient certain preventive protocols, quality metrics, and best practice recommendations. A written personalized care plan for preventive services as well as general preventive health recommendations were provided to patient.   Mliss Graff, LPN   [id number]/81/[id number]   Return in 1 year (on 12/25/2024).  After Visit Summary: (MyChart) Due to this being a telephonic visit, the after visit summary with patients personalized plan was offered to patient via MyChart   Nurse  Notes:

## 2023-12-27 NOTE — Progress Notes (Signed)
 Parker Saunders,  Thank you for taking the time for your Medicare Wellness Visit. I appreciate your continued commitment to your health goals. Please review the care plan we discussed, and feel free to reach out if I can assist you further.  Please note that Annual Wellness Visits do not include a physical exam. Some assessments may be limited, especially if the visit was conducted virtually. If needed, we may recommend an in-person follow-up with your provider.  Ongoing Car Seeing your primary care provider every 3 to 6 months helps us  monitor your health and provide consistent, personalized care.   Referrals If a referral was made during today's visit and you haven't received any updates within two weeks, please contact the referred provider directly to check on the status.  Recommended Screenings:  Health Maintenance  Topic Date Due   Hepatitis B Vaccine (1 of 3 - 19+ 3-dose series) Never done   Zoster (Shingles) Vaccine (1 of 2) Never done   Pneumococcal Vaccine for age over 35 (1 of 1 - PCV) Never done   COVID-19 Vaccine (1) 01/11/2024*   Flu Shot  05/07/2024*   Cologuard (Stool DNA test)  04/21/2024   Medicare Annual Wellness Visit  12/25/2024   DTaP/Tdap/Td vaccine (2 - Td or Tdap) 01/17/2028   Hepatitis C Screening  Completed   HIV Screening  Completed   HPV Vaccine  Aged Out   Meningitis B Vaccine  Aged Out  *Topic was postponed. The date shown is not the original due date.       12/25/2023    9:19 PM  Advanced Directives  Does Patient Have a Medical Advance Directive? No  Would patient like information on creating a medical advance directive? No - Patient declined    Vision: Annual vision screenings are recommended for early detection of glaucoma, cataracts, and diabetic retinopathy. These exams can also reveal signs of chronic conditions such as diabetes and high blood pressure.  Dental: Annual dental screenings help detect early signs of oral cancer, gum disease, and  other conditions linked to overall health, including heart disease and diabetes.  Please see the attached documents for additional preventive care recommendations.

## 2023-12-28 ENCOUNTER — Encounter: Payer: Self-pay | Admitting: Family Medicine

## 2023-12-28 ENCOUNTER — Ambulatory Visit (INDEPENDENT_AMBULATORY_CARE_PROVIDER_SITE_OTHER): Admitting: Family Medicine

## 2023-12-28 VITALS — BP 106/60 | HR 82 | Temp 97.4°F | Resp 15 | Ht 75.0 in | Wt 274.3 lb

## 2023-12-28 DIAGNOSIS — E785 Hyperlipidemia, unspecified: Secondary | ICD-10-CM

## 2023-12-28 DIAGNOSIS — I4891 Unspecified atrial fibrillation: Secondary | ICD-10-CM

## 2023-12-28 DIAGNOSIS — I10 Essential (primary) hypertension: Secondary | ICD-10-CM

## 2023-12-28 LAB — LIPID PANEL
Cholesterol: 133 mg/dL (ref 0–200)
HDL: 36.7 mg/dL — ABNORMAL LOW (ref >39.00)
LDL Cholesterol: 71 mg/dL (ref 0–99)
NonHDL: 96.69
Total CHOL/HDL Ratio: 4
Triglycerides: 128 mg/dL (ref 0.0–149.0)
VLDL: 25.6 mg/dL (ref 0.0–40.0)

## 2023-12-28 LAB — COMPREHENSIVE METABOLIC PANEL WITH GFR
ALT: 25 U/L (ref 0–53)
AST: 20 U/L (ref 0–37)
Albumin: 4.3 g/dL (ref 3.5–5.2)
Alkaline Phosphatase: 84 U/L (ref 39–117)
BUN: 17 mg/dL (ref 6–23)
CO2: 29 meq/L (ref 19–32)
Calcium: 9.2 mg/dL (ref 8.4–10.5)
Chloride: 101 meq/L (ref 96–112)
Creatinine, Ser: 0.98 mg/dL (ref 0.40–1.50)
GFR: 84.42 mL/min (ref >60.00)
Glucose, Bld: 88 mg/dL (ref 70–99)
Potassium: 3.9 meq/L (ref 3.5–5.1)
Sodium: 138 meq/L (ref 135–145)
Total Bilirubin: 0.5 mg/dL (ref 0.2–1.2)
Total Protein: 7.7 g/dL (ref 6.0–8.3)

## 2023-12-28 MED ORDER — LOSARTAN POTASSIUM-HCTZ 100-12.5 MG PO TABS: 1.0000 | ORAL_TABLET | Freq: Every day | ORAL | 2 refills | Status: AC

## 2023-12-28 MED ORDER — ATORVASTATIN CALCIUM 20 MG PO TABS: 20.0000 mg | ORAL_TABLET | Freq: Every day | ORAL | 2 refills | Status: DC

## 2023-12-28 NOTE — Patient Instructions (Signed)
 Thank you for coming in today. No change in medications at this time. If there are any concerns on your bloodwork, I will let you know. Take care!

## 2023-12-28 NOTE — Progress Notes (Signed)
 Subjective:  Patient ID: Parker Saunders, male    DOB: 10-Feb-1965  Age: 58 y.o. MRN: [id number]  CC:  Chief Complaint  Patient presents with   Follow-up    No questions or concerns.    Hypertension    Checks at home. Checks once a week.    Hyperlipidemia    HPI Parker Saunders presents for   Hypertension: losartan /HCTZ 100/12.5 mg daily. Also on Eliquis  5 mg twice daily with history of atrial fibrillation.  Note reviewed from A-fib clinic on November 11.  Status post A-fib ablation 09/12/2022 and appeared to be maintaining sinus rhythm.  He was continued on Eliquis  5 mg twice daily and follow-up with Dr. Nancey in 1 year.  Mild OSA previously but not interested in CPAP. No side effects with current meds. No new bleeding/bruising.  Home readings: 110-138/62-75 HR78-88 BP Readings from Last 3 Encounters:  12/28/23 106/60  12/19/23 136/82  06/22/23 112/80   Lab Results  Component Value Date   CREATININE 0.99 06/16/2023    Hyperlipidemia: With coronary artery disease with coronary artery calcium  score of 109 previously, no anginal symptoms per recent A-fib note.  He is taking Lipitor 20 mg daily.  No myalgias or new side effects.  Lab Results  Component Value Date   CHOL 138 06/16/2023   HDL 34.40 (L) 06/16/2023   LDLCALC 75 06/16/2023   LDLDIRECT 88.0 06/13/2022   TRIG 140.0 06/16/2023   CHOLHDL 4 06/16/2023   Lab Results  Component Value Date   ALT 20 06/16/2023   AST 16 06/16/2023   ALKPHOS 89 06/16/2023   BILITOT 0.5 06/16/2023   HM: Declines recommended vaccines.    History Patient Active Problem List   Diagnosis Date Noted   Hypercoagulable state due to persistent atrial fibrillation (HCC) 10/11/2022   Persistent atrial fibrillation (HCC) 01/25/2022   Spinal stenosis of lumbar region 02/04/2016   Essential hypertension 01/25/2013   Past Medical History:  Diagnosis Date   Hypertension    Renal stone    Rotator cuff disorder    Past Surgical History:   Procedure Laterality Date   ATRIAL FIBRILLATION ABLATION N/A 09/12/2022   Procedure: ATRIAL FIBRILLATION ABLATION;  Surgeon: Nancey Eulas BRAVO, MD;  Location: [facility] INVASIVE [facility] LAB;  Service: Cardiovascular;  Laterality: N/A;   BACK SURGERY  2005   CARDIOVERSION N/A 02/17/2022   Procedure: CARDIOVERSION;  Surgeon: Mona Vinie BROCKS, MD;  Location: [facility] ENDOSCOPY;  Service: Cardiovascular;  Laterality: N/A;   LUMBAR LAMINECTOMY/DECOMPRESSION MICRODISCECTOMY Left 02/04/2016   Procedure: REDO MICRO LUMBAR DECOMPRESSION L4-5, L5-S1 ON THE LEFT  2 LEVELS;  Surgeon: Reyes Billing, MD;  Location: [facility] ORS;  Service: Orthopedics;  Laterality: Left;   SHOULDER SURGERY Right 2014   SPINE SURGERY  02/07/2005   [facility]   No Known Allergies Prior to Admission medications   Medication Sig Start Date End Date Taking? Authorizing Provider  acetaminophen  (TYLENOL ) 500 MG tablet Take 500 mg by mouth every 6 (six) hours as needed for moderate pain or mild pain.   Yes [provider]  apixaban  (ELIQUIS ) 5 MG TABS tablet Take 1 tablet (5 mg total) by mouth 2 (two) times daily. 10/23/23  Yes Fenton, Clint R, PA  atorvastatin  (LIPITOR) 20 MG tablet Take 1 tablet (20 mg total) by mouth daily. TAKE 1 TABLET(20 MG) BY MOUTH DAILY 06/16/23  Yes Levora Reyes SAUNDERS, MD  losartan -hydrochlorothiazide  (HYZAAR) 100-12.5 MG tablet Take 1 tablet by mouth daily. 06/16/23  Yes Levora Reyes SAUNDERS, MD  Social History   Socioeconomic History   Marital status: Married    Spouse name: Not on file   Number of children: 2   Years of education: Not on file   Highest education level: 12th grade  Occupational History   Occupation: unemployed    Comment: since 2015  Tobacco Use   Smoking status: Never   Smokeless tobacco: Never   Tobacco comments:    Never smoke 02/24/22  Substance and Sexual Activity   Alcohol use: No   Drug use: No   Sexual activity: Yes  Other Topics Concern   Not on file  Social History Narrative    Marital status: married      Children: 2 daughters      Lives: with wife, 2 daughters      Employment: unemployed after termination in 2015     Tobacco: none      Alcohol: none      Exercise:  Walking 4-5 miles daily   Social Drivers of Health   Financial Resource Strain: Medium Risk (12/25/2023)   Overall Financial Resource Strain (CARDIA)    Difficulty of Paying Living Expenses: Somewhat hard  Food Insecurity: Food Insecurity Present (12/26/2023)   Hunger Vital Sign    Worried About Running Out of Food in the Last Year: Often true    Ran Out of Food in the Last Year: Sometimes true  Transportation Needs: No Transportation Needs (12/26/2023)   PRAPARE - Administrator, Civil Service (Medical): No    Lack of Transportation (Non-Medical): No  Physical Activity: Inactive (12/26/2023)   Exercise Vital Sign    Days of Exercise per Week: 0 days    Minutes of Exercise per Session: 0 min  Stress: No Stress Concern Present (12/26/2023)   [employer] of Occupational Health - Occupational Stress Questionnaire    Feeling of Stress: Not at all  Social Connections: Unknown (12/26/2023)   Social Connection and Isolation Panel    Frequency of Communication with Friends and Family: Never    Frequency of Social Gatherings with Friends and Family: Patient declined    Attends Religious Services: More than 4 times per year    Active Member of [employer] or Organizations: No    Attends Banker Meetings: Never    Marital Status: Married  Catering Manager Violence: Not At Risk (12/26/2023)   Humiliation, Afraid, Rape, and Kick questionnaire    Fear of Current or Ex-Partner: No    Emotionally Abused: No    Physically Abused: No    Sexually Abused: No    Review of Systems  Constitutional:  Negative for fatigue and unexpected weight change.  Eyes:  Negative for visual disturbance.  Respiratory:  Negative for cough, chest tightness and shortness of breath.    Cardiovascular:  Negative for chest pain, palpitations and leg swelling.  Gastrointestinal:  Negative for abdominal pain and blood in stool.  Neurological:  Negative for dizziness, light-headedness and headaches.     Objective:   Vitals:   12/28/23 0836  BP: 106/60  Pulse: 82  Resp: 15  Temp: (!) 97.4 F (36.3 C)  TempSrc: Temporal  SpO2: 97%  Weight: 274 lb 4.8 oz (124.4 kg)  Height: 6' 3 (1.905 m)    Physical Exam Vitals reviewed.  Constitutional:      Appearance: He is well-developed.  HENT:     Head: Normocephalic and atraumatic.  Neck:     Vascular: No carotid bruit or JVD.  Cardiovascular:  Rate and Rhythm: Normal rate and regular rhythm.     Heart sounds: Normal heart sounds. No murmur heard. Pulmonary:     Effort: Pulmonary effort is normal.     Breath sounds: Normal breath sounds. No rales.  Musculoskeletal:     Right lower leg: No edema.     Left lower leg: No edema.  Skin:    General: Skin is warm and dry.  Neurological:     Mental Status: He is alert and oriented to person, place, and time.  Psychiatric:        Mood and Affect: Mood normal.     Assessment & Plan:  Parker Saunders is a 58 y.o. male . Essential hypertension - Plan: Comprehensive metabolic panel with GFR, losartan -hydrochlorothiazide  (HYZAAR) 100-12.5 MG tablet  - With atrial fibrillation, tolerating coagulation, blood pressure stable at home as well as in office without new side effects of medications.  Check labs and adjust plan accordingly, no changes for now, 65-month follow-up for physical.  Hyperlipidemia, unspecified hyperlipidemia type - Plan: Comprehensive metabolic panel with GFR, Lipid panel, atorvastatin  (LIPITOR) 20 MG tablet  - Check labs, continue Lipitor same dose for now.  CAD with elevated coronary calcium  scoring as above, asymptomatic.  Continue to monitor, 29-month follow-up.  Meds ordered this encounter  Medications   atorvastatin  (LIPITOR) 20 MG tablet     Sig: Take 1 tablet (20 mg total) by mouth daily. TAKE 1 TABLET(20 MG) BY MOUTH DAILY    Dispense:  90 tablet    Refill:  2   losartan -hydrochlorothiazide  (HYZAAR) 100-12.5 MG tablet    Sig: Take 1 tablet by mouth daily.    Dispense:  90 tablet    Refill:  2   Patient Instructions  Thank you for coming in today. No change in medications at this time. If there are any concerns on your bloodwork, I will let you know. Take care!     Signed,   Reyes Pines, MD [facility], [facility] 12/28/23 9:21 AM

## 2024-01-02 ENCOUNTER — Ambulatory Visit: Payer: Self-pay | Admitting: Family Medicine

## 2024-02-14 ENCOUNTER — Encounter: Payer: Self-pay | Admitting: Family Medicine

## 2024-02-14 ENCOUNTER — Ambulatory Visit: Admitting: Family Medicine

## 2024-02-14 VITALS — BP 110/68 | HR 87 | Temp 98.2°F | Resp 12 | Ht 75.0 in | Wt 276.6 lb

## 2024-02-14 DIAGNOSIS — H9202 Otalgia, left ear: Secondary | ICD-10-CM

## 2024-02-14 DIAGNOSIS — H6123 Impacted cerumen, bilateral: Secondary | ICD-10-CM

## 2024-02-14 NOTE — Progress Notes (Signed)
 "  Subjective:  Patient ID: Parker Saunders, male    DOB: 1965/10/13  Age: 59 y.o. MRN: [id number]  CC:  Chief Complaint  Patient presents with   Ear Pain    Left ear ache for about 2 weeks now. Drops not easing discomfort    HPI Parker Saunders presents for   Left ear pain Noticed past 2 weeks. No cold/congestion symptoms. Felt water in the ear from shower. Has not stuck anything in ear.  Feels blocked, and sore.  Has tried over-the-counter drops without benefit - for swimmers ear. Feels popping sensation after drops, but remains blocked. No fever, no ear discharge.  R ear feels fine. No nasal congestion. No HA/face pain.  No fever.    History Patient Active Problem List   Diagnosis Date Noted   Hypercoagulable state due to persistent atrial fibrillation (HCC) 10/11/2022   Persistent atrial fibrillation (HCC) 01/25/2022   Spinal stenosis of lumbar region 02/04/2016   Essential hypertension 01/25/2013   Past Medical History:  Diagnosis Date   Hypertension    Renal stone    Rotator cuff disorder    Past Surgical History:  Procedure Laterality Date   ATRIAL FIBRILLATION ABLATION N/A 09/12/2022   Procedure: ATRIAL FIBRILLATION ABLATION;  Surgeon: Nancey Eulas BRAVO, MD;  Location: [facility];  Service: Cardiovascular;  Laterality: N/A;   BACK SURGERY  2005   CARDIOVERSION N/A 02/17/2022   Procedure: CARDIOVERSION;  Surgeon: Mona Vinie BROCKS, MD;  Location: [facility] ENDOSCOPY;  Service: Cardiovascular;  Laterality: N/A;   LUMBAR LAMINECTOMY/DECOMPRESSION MICRODISCECTOMY Left 02/04/2016   Procedure: REDO MICRO LUMBAR DECOMPRESSION L4-5, L5-S1 ON THE LEFT  2 LEVELS;  Surgeon: Reyes Billing, MD;  Location: [facility] ORS;  Service: Orthopedics;  Laterality: Left;   SHOULDER SURGERY Right 2014   SPINE SURGERY  02/07/2005   [facility]   Allergies[1] Prior to Admission medications  Medication Sig Start Date End Date Taking? Authorizing Provider  apixaban  (ELIQUIS ) 5 MG TABS tablet  Take 1 tablet (5 mg total) by mouth 2 (two) times daily. 10/23/23  Yes Fenton, Clint R, PA  atorvastatin  (LIPITOR) 20 MG tablet Take 1 tablet (20 mg total) by mouth daily. TAKE 1 TABLET(20 MG) BY MOUTH DAILY 12/28/23  Yes Levora Reyes SAUNDERS, MD  losartan -hydrochlorothiazide  (HYZAAR) 100-12.5 MG tablet Take 1 tablet by mouth daily. 12/28/23  Yes Levora Reyes SAUNDERS, MD  acetaminophen  (TYLENOL ) 500 MG tablet Take 500 mg by mouth every 6 (six) hours as needed for moderate pain or mild pain.    [provider]   Social History   Socioeconomic History   Marital status: Married    Spouse name: Not on file   Number of children: 2   Years of education: Not on file   Highest education level: 12th grade  Occupational History   Occupation: unemployed    Comment: since 2015  Tobacco Use   Smoking status: Never   Smokeless tobacco: Never   Tobacco comments:    Never smoke 02/24/22  Substance and Sexual Activity   Alcohol use: No   Drug use: No   Sexual activity: Yes  Other Topics Concern   Not on file  Social History Narrative   Marital status: married      Children: 2 daughters      Lives: with wife, 2 daughters      Employment: unemployed after termination in 2015     Tobacco: none      Alcohol: none      Exercise:  Walking 4-5 miles daily   Social Drivers of Health   Tobacco Use: Low Risk (02/14/2024)   Patient History    Smoking Tobacco Use: Never    Smokeless Tobacco Use: Never    Passive Exposure: Not on file  Financial Resource Strain: Medium Risk (12/25/2023)   Overall Financial Resource Strain (CARDIA)    Difficulty of Paying Living Expenses: Somewhat hard  Food Insecurity: Food Insecurity Present (12/26/2023)   Epic    Worried About Programme Researcher, Broadcasting/film/video in the Last Year: Often true    Ran Out of Food in the Last Year: Sometimes true  Transportation Needs: No Transportation Needs (12/26/2023)   Epic    Lack of Transportation (Medical): No    Lack of Transportation  (Non-Medical): No  Physical Activity: Inactive (12/26/2023)   Exercise Vital Sign    Days of Exercise per Week: 0 days    Minutes of Exercise per Session: 0 min  Stress: No Stress Concern Present (12/26/2023)   [employer] of Occupational Health - Occupational Stress Questionnaire    Feeling of Stress: Not at all  Social Connections: Unknown (12/26/2023)   Social Connection and Isolation Panel    Frequency of Communication with Friends and Family: Never    Frequency of Social Gatherings with Friends and Family: Patient declined    Attends Religious Services: More than 4 times per year    Active Member of Clubs or Organizations: No    Attends Banker Meetings: Never    Marital Status: Married  Catering Manager Violence: Not At Risk (12/26/2023)   Epic    Fear of Current or Ex-Partner: No    Emotionally Abused: No    Physically Abused: No    Sexually Abused: No  Depression (PHQ2-9): Low Risk (12/28/2023)   Depression (PHQ2-9)    PHQ-2 Score: 0  Alcohol Screen: Low Risk (07/28/2022)   Alcohol Screen    Last Alcohol Screening Score (AUDIT): 0  Housing: Low Risk (12/26/2023)   Epic    Unable to Pay for Housing in the Last Year: No    Number of Times Moved in the Last Year: 0    Homeless in the Last Year: No  Utilities: Not At Risk (12/26/2023)   Epic    Threatened with loss of utilities: No  Health Literacy: Adequate Health Literacy (12/26/2023)   B1300 Health Literacy    Frequency of need for help with medical instructions: Never    Review of Systems   Objective:   Vitals:   02/14/24 0927  BP: 110/68  Pulse: 87  Resp: 12  Temp: 98.2 F (36.8 C)  TempSrc: Temporal  SpO2: 97%  Weight: 276 lb 9.6 oz (125.5 kg)  Height: 6' 3 (1.905 m)     Physical Exam Vitals reviewed.  Constitutional:      Appearance: He is well-developed.  HENT:     Head: Normocephalic and atraumatic.     Right Ear: Tympanic membrane, ear canal and external ear normal.  There is impacted cerumen.     Left Ear: Tympanic membrane, ear canal and external ear normal. There is impacted cerumen.     Ears:     Comments: Minimal discomfort with traction of pinna, but no focal tenderness, no canal swelling noted.  Mastoid nontender.  Unable to visualize most of canal due to dark cerumen impaction.  Visualized portion without erythema or edema.  No surrounding lymphadenopathy    Nose: No rhinorrhea.     Mouth/Throat:  Pharynx: No oropharyngeal exudate or posterior oropharyngeal erythema.  Eyes:     Conjunctiva/sclera: Conjunctivae normal.     Pupils: Pupils are equal, round, and reactive to light.  Cardiovascular:     Rate and Rhythm: Normal rate and regular rhythm.     Heart sounds: Normal heart sounds. No murmur heard. Pulmonary:     Effort: Pulmonary effort is normal.     Breath sounds: Normal breath sounds. No wheezing, rhonchi or rales.  Abdominal:     Palpations: Abdomen is soft.     Tenderness: There is no abdominal tenderness.  Musculoskeletal:     Cervical back: Neck supple.  Lymphadenopathy:     Cervical: No cervical adenopathy.  Skin:    General: Skin is warm and dry.     Findings: No rash.  Neurological:     Mental Status: He is alert and oriented to person, place, and time.  Psychiatric:        Behavior: Behavior normal.   10:27 AM We discussed renal evaluation including risks, potential side effects, consent was obtained for lavage in office, completed by CMA, exam after lavage without any residual cerumen, TMs pearly gray, canals clear, no erythema and no pain on left ear including with palpation or traction.  Assessment & Plan:  Parker Saunders is a 59 y.o. male . Bilateral impacted cerumen  Left ear pain Bilateral cerumen impaction with left ear pain, potentially due to him treatment, manipulation but I do not see any sign of infection.  Symptoms have improved after lavage bilaterally without complications.  RTC precautions given,  treatment options discussed at home with Debrox for prevention.  No orders of the defined types were placed in this encounter.  Patient Instructions  Glad to hear that your symptoms have improved with removal of cerumen.  I do not see any sign of infection at this time.  If any return of left ear pain, please be seen but I do not expect that to occur.  Over-the-counter Debrox wax removal may be helpful every 3 to 6 months to help prevent blockage.  See information below.  Take care!  Earwax Buildup, Adult Your ears make something called earwax. It helps keep germs called bacteria away and protects the skin in your ears. Sometimes, too much earwax can build up. This can cause discomfort or make it harder to hear. What are the causes? Earwax buildup can happen when you have too much earwax in your ears. Earwax is made in the outer part of your ear canal. It's supposed to fall out in small amounts over time. But if your ears aren't able to clean themselves like they should, earwax can build up. What increases the risk? You're more likely to get earwax buildup if: You clean your ears with cotton swabs. You pick at your ears. You use earplugs or in-ear headphones a lot. You wear hearing aids. You may also be more likely to get it if: You're male. You're older. Your ears naturally make more earwax. You have narrow ear canals or extra hair in your ears. Your earwax is too thick or sticky. You have eczema. You're dehydrated. This means there's not enough fluid in your body. What are the signs or symptoms? Symptoms of earwax buildup include: Not being able to hear as well. A feeling of fullness in your ear. Feeling like your ear is plugged. Fluid coming from your ear. Ear pain or an itchy ear. Ringing in your ear. Coughing or problems with balance. How is  this diagnosed? Earwax buildup may be diagnosed based on your symptoms, medical history, and an ear exam. During the exam, your health  care provider will look into your ear with a tool called an otoscope. You may also have tests, such as a hearing test. How is this treated? Earwax buildup may be treated by: Using ear drops. Having the earwax removed by a provider. The provider may: Flush the ear with water. Use a tool called a curette that has a loop on the end. Use a suction device. Having surgery. This may be done in severe cases. Follow these instructions at home:  Cleaning your ears Clean your ears as told by your provider. You can clean the outside of your ears with a washcloth or tissue. Do not overclean your ears. Do not put anything into your ear unless told. This includes cotton swabs. General instructions Take over-the-counter and prescription medicines only as told by your provider. Drink enough fluid to keep your pee (urine) pale yellow. This helps thin the earwax. If you have hearing aids, clean them as told. Keep all follow-up visits. If earwax builds up in your ears often or if you use hearing aids, ask your provider how often you should have your ears cleaned. Contact a health care provider if: Your ear pain gets worse. You have a fever. You have pus, blood, or other fluid coming from your ear. You have hearing loss. You have ringing in your ears that won't go away. You feel like the room is spinning. This is called vertigo. Your symptoms don't get better with treatment. This information is not intended to replace advice given to you by your health care provider. Make sure you discuss any questions you have with your health care provider. Document Revised: 04/07/2022 Document Reviewed: 04/07/2022 Elsevier Patient Education  2024 Elsevier Inc.    Signed,   Reyes Pines, MD [facility], [facility] 02/14/2024 10:27 AM      [1] No Known Allergies  "

## 2024-02-14 NOTE — Patient Instructions (Signed)
 Glad to hear that your symptoms have improved with removal of cerumen.  I do not see any sign of infection at this time.  If any return of left ear pain, please be seen but I do not expect that to occur.  Over-the-counter Debrox wax removal may be helpful every 3 to 6 months to help prevent blockage.  See information below.  Take care!  Earwax Buildup, Adult Your ears make something called earwax. It helps keep germs called bacteria away and protects the skin in your ears. Sometimes, too much earwax can build up. This can cause discomfort or make it harder to hear. What are the causes? Earwax buildup can happen when you have too much earwax in your ears. Earwax is made in the outer part of your ear canal. It's supposed to fall out in small amounts over time. But if your ears aren't able to clean themselves like they should, earwax can build up. What increases the risk? You're more likely to get earwax buildup if: You clean your ears with cotton swabs. You pick at your ears. You use earplugs or in-ear headphones a lot. You wear hearing aids. You may also be more likely to get it if: You're male. You're older. Your ears naturally make more earwax. You have narrow ear canals or extra hair in your ears. Your earwax is too thick or sticky. You have eczema. You're dehydrated. This means there's not enough fluid in your body. What are the signs or symptoms? Symptoms of earwax buildup include: Not being able to hear as well. A feeling of fullness in your ear. Feeling like your ear is plugged. Fluid coming from your ear. Ear pain or an itchy ear. Ringing in your ear. Coughing or problems with balance. How is this diagnosed? Earwax buildup may be diagnosed based on your symptoms, medical history, and an ear exam. During the exam, your health care provider will look into your ear with a tool called an otoscope. You may also have tests, such as a hearing test. How is this treated? Earwax  buildup may be treated by: Using ear drops. Having the earwax removed by a provider. The provider may: Flush the ear with water. Use a tool called a curette that has a loop on the end. Use a suction device. Having surgery. This may be done in severe cases. Follow these instructions at home:  Cleaning your ears Clean your ears as told by your provider. You can clean the outside of your ears with a washcloth or tissue. Do not overclean your ears. Do not put anything into your ear unless told. This includes cotton swabs. General instructions Take over-the-counter and prescription medicines only as told by your provider. Drink enough fluid to keep your pee (urine) pale yellow. This helps thin the earwax. If you have hearing aids, clean them as told. Keep all follow-up visits. If earwax builds up in your ears often or if you use hearing aids, ask your provider how often you should have your ears cleaned. Contact a health care provider if: Your ear pain gets worse. You have a fever. You have pus, blood, or other fluid coming from your ear. You have hearing loss. You have ringing in your ears that won't go away. You feel like the room is spinning. This is called vertigo. Your symptoms don't get better with treatment. This information is not intended to replace advice given to you by your health care provider. Make sure you discuss any questions you have with  your health care provider. Document Revised: 04/07/2022 Document Reviewed: 04/07/2022 Elsevier Patient Education  2024 [employer].

## 2024-03-12 ENCOUNTER — Other Ambulatory Visit: Payer: Self-pay | Admitting: Family Medicine

## 2024-03-12 DIAGNOSIS — E785 Hyperlipidemia, unspecified: Secondary | ICD-10-CM

## 2024-06-27 ENCOUNTER — Encounter: Admitting: Family Medicine

## 2025-01-01 ENCOUNTER — Ambulatory Visit
